# Patient Record
Sex: Female | Born: 1985 | Race: White | Hispanic: Yes | Marital: Married | Smoking: Never smoker
Health system: Southern US, Community
[De-identification: ages and names within clinical notes are randomized; demographics above are authoritative.]

## PROBLEM LIST (undated history)

## (undated) ENCOUNTER — Emergency Department (HOSPITAL_COMMUNITY): Admission: EM | Disposition: A | Discharge status: Home / Self Care | Payer: Self-pay

## (undated) DIAGNOSIS — K429 Umbilical hernia without obstruction or gangrene: Secondary | ICD-10-CM

## (undated) DIAGNOSIS — T7840XA Allergy, unspecified, initial encounter: Secondary | ICD-10-CM

## (undated) DIAGNOSIS — O1420 HELLP syndrome (HELLP), unspecified trimester: Secondary | ICD-10-CM

## (undated) DIAGNOSIS — F329 Major depressive disorder, single episode, unspecified: Secondary | ICD-10-CM

## (undated) DIAGNOSIS — M329 Systemic lupus erythematosus, unspecified: Secondary | ICD-10-CM

## (undated) DIAGNOSIS — B009 Herpesviral infection, unspecified: Secondary | ICD-10-CM

## (undated) DIAGNOSIS — F32A Depression, unspecified: Secondary | ICD-10-CM

## (undated) DIAGNOSIS — R200 Anesthesia of skin: Secondary | ICD-10-CM

## (undated) DIAGNOSIS — K219 Gastro-esophageal reflux disease without esophagitis: Secondary | ICD-10-CM

## (undated) HISTORY — DX: Major depressive disorder, single episode, unspecified: F32.9

## (undated) HISTORY — DX: Umbilical hernia without obstruction or gangrene: K42.9

## (undated) HISTORY — DX: Allergy, unspecified, initial encounter: T78.40XA

## (undated) HISTORY — DX: Systemic lupus erythematosus, unspecified: M32.9

## (undated) HISTORY — DX: Depression, unspecified: F32.A

---
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
## 2006-06-24 ENCOUNTER — Emergency Department (HOSPITAL_COMMUNITY): Admission: EM | Admit: 2006-06-24 | Discharge: 2006-06-25 | Payer: Self-pay | Admitting: Emergency Medicine

## 2006-06-26 ENCOUNTER — Emergency Department (HOSPITAL_COMMUNITY): Admission: EM | Admit: 2006-06-26 | Discharge: 2006-06-26 | Payer: Self-pay | Admitting: Emergency Medicine

## 2006-08-14 ENCOUNTER — Ambulatory Visit: Payer: Self-pay | Admitting: Family Medicine

## 2006-08-21 ENCOUNTER — Ambulatory Visit (HOSPITAL_COMMUNITY): Admission: RE | Admit: 2006-08-21 | Discharge: 2006-08-21 | Payer: Self-pay | Admitting: Obstetrics & Gynecology

## 2006-11-13 ENCOUNTER — Ambulatory Visit: Payer: Self-pay | Admitting: Family Medicine

## 2006-11-18 ENCOUNTER — Ambulatory Visit (HOSPITAL_COMMUNITY): Admission: RE | Admit: 2006-11-18 | Discharge: 2006-11-18 | Payer: Self-pay | Admitting: Obstetrics & Gynecology

## 2007-06-11 ENCOUNTER — Emergency Department (HOSPITAL_COMMUNITY): Admission: EM | Admit: 2007-06-11 | Discharge: 2007-06-12 | Payer: Self-pay | Admitting: Emergency Medicine

## 2007-06-15 ENCOUNTER — Emergency Department (HOSPITAL_COMMUNITY): Admission: EM | Admit: 2007-06-15 | Discharge: 2007-06-15 | Payer: Self-pay | Admitting: Emergency Medicine

## 2007-09-18 ENCOUNTER — Inpatient Hospital Stay (HOSPITAL_COMMUNITY): Admission: AD | Admit: 2007-09-18 | Discharge: 2007-09-18 | Payer: Self-pay | Admitting: Obstetrics & Gynecology

## 2008-01-01 ENCOUNTER — Inpatient Hospital Stay (HOSPITAL_COMMUNITY): Admission: AD | Admit: 2008-01-01 | Discharge: 2008-01-01 | Payer: Self-pay | Admitting: Obstetrics & Gynecology

## 2008-01-29 ENCOUNTER — Inpatient Hospital Stay (HOSPITAL_COMMUNITY): Admission: AD | Admit: 2008-01-29 | Discharge: 2008-01-29 | Payer: Self-pay | Admitting: Obstetrics & Gynecology

## 2008-02-08 ENCOUNTER — Inpatient Hospital Stay (HOSPITAL_COMMUNITY): Admission: AD | Admit: 2008-02-08 | Discharge: 2008-02-08 | Payer: Self-pay | Admitting: Obstetrics & Gynecology

## 2008-02-15 ENCOUNTER — Inpatient Hospital Stay (HOSPITAL_COMMUNITY): Admission: AD | Admit: 2008-02-15 | Discharge: 2008-02-16 | Payer: Self-pay | Admitting: Obstetrics & Gynecology

## 2008-02-29 ENCOUNTER — Inpatient Hospital Stay (HOSPITAL_COMMUNITY): Admission: AD | Admit: 2008-02-29 | Discharge: 2008-03-03 | Payer: Self-pay | Admitting: Obstetrics & Gynecology

## 2008-08-25 ENCOUNTER — Emergency Department (HOSPITAL_COMMUNITY): Admission: EM | Admit: 2008-08-25 | Discharge: 2008-08-25 | Payer: Self-pay | Admitting: Emergency Medicine

## 2009-02-14 ENCOUNTER — Emergency Department (HOSPITAL_COMMUNITY): Admission: EM | Admit: 2009-02-14 | Discharge: 2009-02-15 | Payer: Self-pay | Admitting: Emergency Medicine

## 2009-04-21 ENCOUNTER — Ambulatory Visit (HOSPITAL_COMMUNITY): Admission: RE | Admit: 2009-04-21 | Discharge: 2009-04-21 | Payer: Self-pay | Admitting: Obstetrics & Gynecology

## 2009-04-22 ENCOUNTER — Inpatient Hospital Stay (HOSPITAL_COMMUNITY): Admission: AD | Admit: 2009-04-22 | Discharge: 2009-04-22 | Payer: Self-pay | Admitting: Obstetrics & Gynecology

## 2009-04-25 ENCOUNTER — Inpatient Hospital Stay (HOSPITAL_COMMUNITY): Admission: AD | Admit: 2009-04-25 | Discharge: 2009-04-25 | Payer: Self-pay | Admitting: Obstetrics & Gynecology

## 2009-08-04 ENCOUNTER — Emergency Department (HOSPITAL_COMMUNITY): Admission: EM | Admit: 2009-08-04 | Discharge: 2009-08-04 | Payer: Self-pay | Admitting: Emergency Medicine

## 2009-08-15 ENCOUNTER — Ambulatory Visit (HOSPITAL_COMMUNITY): Admission: RE | Admit: 2009-08-15 | Discharge: 2009-08-15 | Payer: Self-pay | Admitting: Obstetrics

## 2009-09-08 ENCOUNTER — Ambulatory Visit (HOSPITAL_COMMUNITY): Admission: RE | Admit: 2009-09-08 | Discharge: 2009-09-08 | Payer: Self-pay | Admitting: Obstetrics

## 2009-09-21 ENCOUNTER — Inpatient Hospital Stay (HOSPITAL_COMMUNITY): Admission: AD | Admit: 2009-09-21 | Discharge: 2009-09-21 | Payer: Self-pay | Admitting: Obstetrics

## 2009-11-11 ENCOUNTER — Inpatient Hospital Stay (HOSPITAL_COMMUNITY): Admission: AD | Admit: 2009-11-11 | Discharge: 2009-11-11 | Payer: Self-pay | Admitting: Obstetrics

## 2009-11-11 ENCOUNTER — Ambulatory Visit: Payer: Self-pay | Admitting: Obstetrics

## 2009-11-11 ENCOUNTER — Ambulatory Visit: Payer: Self-pay | Admitting: Obstetrics and Gynecology

## 2009-11-29 ENCOUNTER — Inpatient Hospital Stay (HOSPITAL_COMMUNITY): Admission: AD | Admit: 2009-11-29 | Discharge: 2009-11-29 | Payer: Self-pay | Admitting: Obstetrics

## 2009-12-03 HISTORY — PX: UMBILICAL HERNIA REPAIR: SHX196

## 2009-12-10 ENCOUNTER — Inpatient Hospital Stay (HOSPITAL_COMMUNITY): Admission: AD | Admit: 2009-12-10 | Discharge: 2009-12-10 | Payer: Self-pay | Admitting: Obstetrics

## 2009-12-14 ENCOUNTER — Inpatient Hospital Stay (HOSPITAL_COMMUNITY): Admission: AD | Admit: 2009-12-14 | Discharge: 2009-12-17 | Payer: Self-pay | Admitting: Obstetrics

## 2010-03-19 ENCOUNTER — Emergency Department (HOSPITAL_COMMUNITY): Admission: EM | Admit: 2010-03-19 | Discharge: 2010-03-20 | Payer: Self-pay | Admitting: Emergency Medicine

## 2010-10-30 ENCOUNTER — Ambulatory Visit (HOSPITAL_BASED_OUTPATIENT_CLINIC_OR_DEPARTMENT_OTHER): Admission: RE | Admit: 2010-10-30 | Discharge: 2010-10-30 | Payer: Self-pay | Admitting: General Surgery

## 2010-11-09 ENCOUNTER — Emergency Department (HOSPITAL_COMMUNITY): Admission: EM | Admit: 2010-11-09 | Discharge: 2010-09-06 | Payer: Self-pay | Admitting: Emergency Medicine

## 2010-12-25 ENCOUNTER — Encounter: Payer: Self-pay | Admitting: Family Medicine

## 2011-02-13 LAB — CBC
Hemoglobin: 11.1 g/dL — ABNORMAL LOW (ref 12.0–15.0)
MCV: 86.9 fL (ref 78.0–100.0)
RDW: 13.4 % (ref 11.5–15.5)
WBC: 5.1 10*3/uL (ref 4.0–10.5)

## 2011-02-13 LAB — PREGNANCY, URINE: Preg Test, Ur: NEGATIVE

## 2011-02-13 LAB — DIFFERENTIAL
Lymphs Abs: 1.8 10*3/uL (ref 0.7–4.0)
Monocytes Absolute: 0.4 10*3/uL (ref 0.1–1.0)
Monocytes Relative: 8 % (ref 3–12)

## 2011-02-15 LAB — DIFFERENTIAL
Basophils Absolute: 0 10*3/uL (ref 0.0–0.1)
Basophils Relative: 0 % (ref 0–1)
Eosinophils Absolute: 0.2 10*3/uL (ref 0.0–0.7)
Lymphocytes Relative: 41 % (ref 12–46)
Monocytes Absolute: 0.5 10*3/uL (ref 0.1–1.0)
Neutrophils Relative %: 49 % (ref 43–77)

## 2011-02-15 LAB — CBC
MCHC: 34.5 g/dL (ref 30.0–36.0)
Platelets: 251 10*3/uL (ref 150–400)
RBC: 3.79 MIL/uL — ABNORMAL LOW (ref 3.87–5.11)
RDW: 14 % (ref 11.5–15.5)
WBC: 6 10*3/uL (ref 4.0–10.5)

## 2011-02-18 LAB — RPR: RPR Ser Ql: NONREACTIVE

## 2011-02-18 LAB — CBC
HCT: 28.5 % — ABNORMAL LOW (ref 36.0–46.0)
RBC: 2.08 MIL/uL — ABNORMAL LOW (ref 3.87–5.11)
WBC: 8.8 10*3/uL (ref 4.0–10.5)

## 2011-03-06 LAB — URINALYSIS, ROUTINE W REFLEX MICROSCOPIC
Bilirubin Urine: NEGATIVE
Glucose, UA: NEGATIVE mg/dL
Hgb urine dipstick: NEGATIVE
Ketones, ur: NEGATIVE mg/dL
Protein, ur: NEGATIVE mg/dL
Specific Gravity, Urine: 1.015 (ref 1.005–1.030)
Urobilinogen, UA: 0.2 mg/dL (ref 0.0–1.0)
pH: 7.5 (ref 5.0–8.0)

## 2011-03-06 LAB — COMPREHENSIVE METABOLIC PANEL
ALT: 11 U/L (ref 0–35)
Alkaline Phosphatase: 187 U/L — ABNORMAL HIGH (ref 39–117)
Creatinine, Ser: 0.35 mg/dL — ABNORMAL LOW (ref 0.4–1.2)
GFR calc Af Amer: >60 mL/min (ref >60)
GFR calc non Af Amer: >60 mL/min (ref >60)
Potassium: 3.5 mEq/L (ref 3.5–5.1)
Sodium: 133 mEq/L — ABNORMAL LOW (ref 135–145)
Total Bilirubin: 0.3 mg/dL (ref 0.3–1.2)

## 2011-03-06 LAB — GC/CHLAMYDIA PROBE AMP, GENITAL: GC Probe Amp, Genital: NEGATIVE

## 2011-03-06 LAB — CBC
HCT: 28.7 % — ABNORMAL LOW (ref 36.0–46.0)
Hemoglobin: 9.8 g/dL — ABNORMAL LOW (ref 12.0–15.0)
MCHC: 34.1 g/dL (ref 30.0–36.0)
MCV: 90.8 fL (ref 78.0–100.0)
Platelets: 197 10*3/uL (ref 150–400)
RBC: 3.16 MIL/uL — ABNORMAL LOW (ref 3.87–5.11)
RDW: 12.6 % (ref 11.5–15.5)

## 2011-03-06 LAB — WET PREP, GENITAL

## 2011-03-06 LAB — STREP B DNA PROBE: Strep Group B Ag: NEGATIVE

## 2011-03-06 LAB — LACTATE DEHYDROGENASE: LDH: 163 U/L (ref 94–250)

## 2011-03-08 LAB — URINALYSIS, ROUTINE W REFLEX MICROSCOPIC
Hgb urine dipstick: NEGATIVE
Nitrite: NEGATIVE
Protein, ur: NEGATIVE mg/dL
Urobilinogen, UA: 0.2 mg/dL (ref 0.0–1.0)

## 2011-03-08 LAB — CBC
HCT: 28.7 % — ABNORMAL LOW (ref 36.0–46.0)
Platelets: 184 10*3/uL (ref 150–400)
RDW: 13.3 % (ref 11.5–15.5)

## 2011-03-08 LAB — FETAL FIBRONECTIN: Fetal Fibronectin: NEGATIVE

## 2011-03-09 LAB — CBC
Platelets: 204 10*3/uL (ref 150–400)
WBC: 7.1 10*3/uL (ref 4.0–10.5)

## 2011-03-09 LAB — DIFFERENTIAL
Lymphocytes Relative: 21 % (ref 12–46)
Lymphs Abs: 1.5 10*3/uL (ref 0.7–4.0)
Neutrophils Relative %: 72 % (ref 43–77)

## 2011-03-09 LAB — BASIC METABOLIC PANEL
BUN: 5 mg/dL — ABNORMAL LOW (ref 6–23)
Creatinine, Ser: 0.44 mg/dL (ref 0.4–1.2)
GFR calc non Af Amer: >60 mL/min (ref >60)
Potassium: 3.3 mEq/L — ABNORMAL LOW (ref 3.5–5.1)

## 2011-03-13 LAB — HCG, QUANTITATIVE, PREGNANCY
hCG, Beta Chain, Quant, S: 132 m[IU]/mL — ABNORMAL HIGH (ref <5)
hCG, Beta Chain, Quant, S: 47 m[IU]/mL — ABNORMAL HIGH (ref <5)

## 2011-03-15 LAB — HEPATIC FUNCTION PANEL
ALT: 11 U/L (ref 0–35)
Alkaline Phosphatase: 206 U/L — ABNORMAL HIGH (ref 39–117)
Indirect Bilirubin: 0.6 mg/dL (ref 0.3–0.9)
Total Protein: 7.3 g/dL (ref 6.0–8.3)

## 2011-03-15 LAB — URINE MICROSCOPIC-ADD ON

## 2011-03-15 LAB — POCT I-STAT, CHEM 8
HCT: 41 % (ref 36.0–46.0)
Hemoglobin: 13.9 g/dL (ref 12.0–15.0)
Potassium: 3.3 mEq/L — ABNORMAL LOW (ref 3.5–5.1)
Sodium: 140 mEq/L (ref 135–145)

## 2011-03-15 LAB — URINALYSIS, ROUTINE W REFLEX MICROSCOPIC
Bilirubin Urine: NEGATIVE
Nitrite: NEGATIVE
Specific Gravity, Urine: 1.03 (ref 1.005–1.030)
Urobilinogen, UA: 0.2 mg/dL (ref 0.0–1.0)

## 2011-04-11 ENCOUNTER — Emergency Department (HOSPITAL_COMMUNITY)
Admission: EM | Admit: 2011-04-11 | Discharge: 2011-04-11 | Disposition: A | Discharge status: Home / Self Care | Payer: BC Managed Care – PPO | Attending: Emergency Medicine | Admitting: Emergency Medicine

## 2011-04-11 ENCOUNTER — Emergency Department (HOSPITAL_COMMUNITY): Payer: BC Managed Care – PPO

## 2011-04-11 DIAGNOSIS — L02519 Cutaneous abscess of unspecified hand: Secondary | ICD-10-CM | POA: Insufficient documentation

## 2011-04-11 DIAGNOSIS — G43909 Migraine, unspecified, not intractable, without status migrainosus: Secondary | ICD-10-CM | POA: Insufficient documentation

## 2011-04-11 DIAGNOSIS — A6000 Herpesviral infection of urogenital system, unspecified: Secondary | ICD-10-CM | POA: Insufficient documentation

## 2011-04-11 DIAGNOSIS — F411 Generalized anxiety disorder: Secondary | ICD-10-CM | POA: Insufficient documentation

## 2011-04-17 NOTE — H&P (Signed)
NAMELAURELL, Evelyn [name]                ACCOUNT NO.:  [id number]   MEDICAL RECORD NO.:  [id number]          PATIENT TYPE:  INP   LOCATION:  9372                          [facility]ILITY:  [facility]   PHYSICIAN:  Roseanna Rainbow, M.D.DATE OF BIRTH:  1986/03/12   DATE OF ADMISSION:  02/29/2008  DATE OF DISCHARGE:                              HISTORY & PHYSICAL   CHIEF COMPLAINT:  The patient is a 25 year old, para 1 with an  intrauterine pregnancy at 38+ weeks complaining of contractions.   HISTORY OF PRESENT ILLNESS:  Please see the above.   ALLERGIES:  No known drug allergies.   MEDICATIONS:  Please see the medication reconciliation form.   LABORATORY DATA:  Urine culture and sensitivity insignificant growth.  Chlamydia probe negative.  GC probe negative.  1-hour GTT 59.  Hepatitis  B surface antigen negative, hematocrit 29.7, hemoglobin 10.3, HIV  nonreactive, platelets 218,000.  Blood type is O+, antibody screen  negative.  RPR is nonreactive.  Rubella immune.  Sickle cell negative.  GBS negative.   PAST OBSTETRICAL HISTORY:  In January 2004, she was delivered of a live  born female 6 pounds 1 ounce at full term vaginal delivery.   PAST GYN HISTORY:  Questionable genital herpes.   PAST MEDICAL HISTORY:  No significant history of medical diseases.   PAST SURGICAL HISTORY:  No previous surgeries.   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  Does not give any significant history of alcohol usage, has no  significant smoking history.  Denies illicit drug use.   FAMILY HISTORY:  No major illnesses known.   REVIEW OF SYSTEMS:  NEUROLOGIC:  She complains of blurred vision.   PHYSICAL EXAM:  Blood pressures is 160s/90s.  Fetal heart tracing  reassuring.  Tocodynamometer contractions every 2-5 minutes.  Sterile  vaginal exam per the RN. The cervix is loosening, and 80%.   ASSESSMENT:  Pregnancy at term. Rule out severe pregnancy-induced  hypertension.   PLAN:   Admission, magnesium sulfate, seizure prophylaxis. Will start  p.o. labetalol to better control her blood pressures. Possible  augmentation of labor.  Will also check a PIH panel.      Roseanna Rainbow, M.D.  Electronically Signed     LAJ/MEDQ  D:  03/01/2008  T:  03/01/2008  Job:  [id number]

## 2011-04-20 NOTE — Group Therapy Note (Signed)
NAMEPOONAM, WOEHRLE NO.:  [id number]   MEDICAL RECORD NO.:  [id number]          PATIENT TYPE:  WOC   LOCATION:  [facility]                   FACILITY:  [facility]   PHYSICIAN:  Montey Hora, M.D.    DATE OF BIRTH:  [birth year]   DATE OF SERVICE:  08/14/2006                                    CLINIC NOTE   Follow-up for ovarian cysts.   This is a 25 year old gravida 1, para 1, status post IUD [place] placement  approximately 2 years ago.  She began to have the onset of severe abdominal  pain in July, unresponsive to medication, having previously started in a  more moderate fashion with intercourse.  She was seen at the emergency room,  received a pelvic ultrasound which showed two cysts on her left ovary, one  of which was hemorrhagic.  She was given pain medicines and told to follow  up in 2 months at the [facility].  Since hospital discharge, the patient  reveals that she continues to have moderate to severe abdominal pain all the  time, 6/10, and dyspareunia to the point where she does not have  intercourse.  She had an LMP of June 01, 2006, which was light and has had  very irregular, very light periods in the past year or so.  Now in addition  to having right-sided pain she is also having some left-sided pain and no  associated fever.  She did have some hematochezia a couple of months ago  thought to be associated with constipation and she was told to drink more  water and has not had any further problems with that.  She denied any  dysuria.  She did not get any antibiotics at the hospital.   PAST MEDICAL HISTORY:  Unremarkable.   MEDICATIONS:  None.   ALLERGIES:  No known drug allergies.   PAST OBSTETRICAL HISTORY:  NSVD x1 without complications.   FAMILY HISTORY:  No endometriosis or infertility problems.   PHYSICAL EXAMINATION:  VITAL SIGNS:  Temperature 98.6, pulse 71, blood  pressure 102/62, weight 108.2.  GENERAL:  WDWN, NAD.  The patient denies  fever, had a mild weight gain of 2  pounds.  No chest pain, shortness of breath, or other associated symptoms.  HEART:  Regular rate and rhythm without murmur.  LUNGS:  Clear to auscultation bilaterally.  Normal respiratory effort.  ABDOMEN:  Soft and nontender.  No guarding or rebound.  No CVA tenderness.  PELVIC:  Normal external genitalia.  Copious watery vaginal discharge.  No  cervical motion tenderness.  No left-sided abdominal tenderness.  She has  mild right-sided abdominal tenderness with no obvious ovarian enlargement.   ASSESSMENT:  1. Right-sided abdominal pain with previously noted ovarian cysts by      ultrasound.  I gave the patient Ortho-Tri-Cyclen take as directed x3      months to suppress the ovarian cysts.  Recheck her pelvic ultrasound to      make sure her hemorrhagic/complicated cysts have resolved.  2. Copious vaginal discharge sent for a wet prep today, but went ahead and      treated her with Flagyl  500 mg p.o. b.i.d. x7 days.  Plan to follow up      in 3 months.           ______________________________  Montey Hora, M.D.     KR/MEDQ  D:  08/14/2006  T:  08/15/2006  Job:  [id number]

## 2011-04-20 NOTE — Group Therapy Note (Signed)
NAMETRIVIA, HEFFELFINGER NO.:  [id number]   MEDICAL RECORD NO.:  [id number]          PATIENT TYPE:  WOC   LOCATION:  [facility]                   FACILITY:  [facility]   PHYSICIAN:  Montey Hora, M.D.    DATE OF BIRTH:  March 14, 1986   DATE OF SERVICE:  11/13/2006                                  CLINIC NOTE   This is a 25 year old G1, P1 who had a Jearld Adjutant IUD placed two years ago.  She did well on it initially. Last summer had the onset of severe right  lower quadrant pain, went to the emergency room and got a pelvic  ultrasound, and was diagnosed with a right ovarian hemorrhagic cyst. She  followed up in __________ [facility] in September 2007, and the pelvic  ultrasound was repeated showing resolution of that cyst, and that the  IUD was in proper position. She was cycled on two months of Ortho-Tri-  Cyclen, initially her pain did a little bit better, and then she has  been off the pill now for a month and thinks that the pain or pressure  in her bilateral low abdomen is a little worse. It is especially bad on  the right when she has intercourse, and there is a certain place that is  tender and __________ stimulated during intercourse, causes her to have  quite a bit of bleeding after intercourse. She denies any actual regular  menstrual cycles. She is also having a thick white discharge that is  itching with no odor, and itching is especially bad after intercourse.  The couple does not use condoms.   MEDICINES:  None.   ALLERGIES:  None.   Last Pap May 2007.   PHYSICAL EXAMINATION:  Pulse is 78, blood pressure 102/70, weight is  110, height is 5 feet 1 inch. General: WEE, WNNAB. Pelvic exam shows  erythematous labia and pelvic area. Vaginal mucosa is slightly  irritated, there is thick white discharge. The cervix is noted to be  multiparous with some ectropion. She has no tenderness to palpation of  the cervix, However, in the right lower quadrant her ovary does  seem to  be mildly enlarged and tender. The left lower quadrant, the ovary does  not appear to be enlarged and is not tender.   ASSESSMENT AND PLAN:  1. Bilateral lower abdominal pain, worse on the right, with previously      noted ovarian cyst by ultrasound, resolved a couple of months      thereafter. Will repeat the pelvic ultrasound just to rule out any      __________ pathology and continue the Cyclen process with Nova ring      or Ortho-Tri-Cyclen birth control pills. The patient will also take      Aleve 1 tablet twice a day with food for 10-14 days to decrease the      inflammation in the area.  2. Probable yeast vaginitis, Diflucan 150 times one, repeat in four      days #2 dispenses and one refill. Followup in six months.           ______________________________  Montey Hora, M.D.  KR/MEDQ  D:  11/13/2006  T:  11/13/2006  Job:  [id number]

## 2011-04-20 NOTE — Discharge Summary (Signed)
NAME:  Evelyn Evelyn [name], Evelyn Evelyn [name]                          ACCOUNT NO.:  [id number]   MEDICAL RECORD NO.:  [id number]                   PATIENT TYPE:  INP   LOCATION:  9145                                 [facility]   PHYSICIAN:  Phil D. Okey Dupre, M.D.                  DATE OF BIRTH:  February 11, 1986   DATE OF ADMISSION:  12/12/2002  DATE OF DISCHARGE:  12/17/2002                                 DISCHARGE SUMMARY   DISCHARGE DIAGNOSES:  1. Evelyn [name] 25 year old G1, P1 status post spontaneous vaginal delivery and     treatment for preeclampsia.  2. Elevated liver enzymes.   DISCHARGE MEDICATIONS:  1. Ibuprofen 600 mg q.6h. p.r.n. pain.  2. Ferrous sulfate 325 mg b.i.d. with meals.  3. Colace 100 mg daily for constipation.  4. Prenatal vitamins daily while breast-feeding.   DISPOSITION AND FOLLOWUP:  The patient is stable on day of discharge and  instructed to follow up at [facility] in six weeks for Evelyn [name] postpartum  check as well as return to GYN Clinic here at [facility] in one week  to recheck liver enzymes.  The patient to call for an appointment.  The  patient is also to receive an IUD placed at [facility] at her six week  visit.   ADMISSION HISTORY AND PHYSICAL:  This 25 year old G1, P0 presented to the  MAU at 37 weeks and 5 days gestational age for Evelyn [name] recheck of her blood  pressure and laboratory results.  She had some laboratory work done on  January 9 which revealed Evelyn [name] high uric acid and she presented to the MAU with  complaints of __________ and seeing yellow spots intermittently, also  nauseated today without vomiting, and mild headache.  She is admitted for  preeclampsia and cervical ripening.  On admission blood pressures were  129/76 and the recheck was 124/83.  Her cervix was 1-2 cm long and -2 and  fetal heart rate tracing was reactive.   ADMISSION LABORATORY DATA:  White blood count 7.2, hemoglobin 10.2,  hematocrit 29.7.  Sodium 140, potassium 4.5, chloride 108, bicarbonate  24,  glucose 84, BUN 8, creatinine 0.8, calcium 8.7, total protein 6.9, albumin  3.3, AST 26, ALT less than 19, alkaline phosphatase 335, total bilirubin  0.3, LDH 201, uric acid 8.7.  Urinalysis within normal limits.  No protein.  RPR nonreactive.   HOSPITAL COURSE:  The patient was admitted to labor and delivery and  Cervidil was placed for cervical ripening.  The patient was started on  magnesium sulfate during labor for seizure prophylaxis and through labor her  blood pressures were ranging systolic 117-134, diastolic 75-101.  She  delivered on January 11 Evelyn [name] viable female by spontaneous vaginal delivery under  no anesthesia.  The patient and infant were stable.  The patient was then  transferred to the adult ICU and continued on the magnesium sulfate.  Day  after delivery she diuresed in the afternoon and then urine output decreased  over that night to the next morning.  Her magnesium was bolused and  increased to 2 g the following day and she diuresed well.  Hospital day  number three the magnesium sulfate was discontinued and she was transferred  to regular bed.  Her blood pressures remained stable 100-110/62-80.  Throughout her postpartum course she denied headache or blurry vision or  shortness of breath.  Her hemoglobin went down to 8.0 and she was started on  ferrous sulfate b.i.d.  Her pain was well controlled with ibuprofen and she  was breast-feeding without difficulty and was ready to go home on hospital  day number five.    DISCHARGE LABORATORY DATA:  White blood count 12.7, hemoglobin 10.2,  hematocrit 29.8, platelets 276,000.  Sodium 138, potassium 4.6, chloride  106, bicarbonate 23, glucose 75, BUN 12, creatinine 0.6, calcium 9.1, total  protein 7.1, albumin 3.1, AST 89, ALT 48, alkaline phosphatase 239, total  bilirubin 0.3, LDH 267, uric acid 7.6.     [name], M.D.                       Phil D. Okey Dupre, M.D.    AS/MEDQ  D:  12/17/2002  T:  12/17/2002  Job:   [id number]

## 2011-07-26 ENCOUNTER — Emergency Department (HOSPITAL_COMMUNITY)
Admission: EM | Admit: 2011-07-26 | Discharge: 2011-07-26 | Disposition: A | Discharge status: Home / Self Care | Payer: BC Managed Care – PPO | Attending: Emergency Medicine | Admitting: Emergency Medicine

## 2011-07-26 ENCOUNTER — Emergency Department (HOSPITAL_COMMUNITY): Payer: BC Managed Care – PPO

## 2011-07-26 DIAGNOSIS — R51 Headache: Secondary | ICD-10-CM | POA: Insufficient documentation

## 2011-07-26 DIAGNOSIS — S0003XA Contusion of scalp, initial encounter: Secondary | ICD-10-CM | POA: Insufficient documentation

## 2011-07-26 DIAGNOSIS — F411 Generalized anxiety disorder: Secondary | ICD-10-CM | POA: Insufficient documentation

## 2011-07-26 DIAGNOSIS — R220 Localized swelling, mass and lump, head: Secondary | ICD-10-CM | POA: Insufficient documentation

## 2011-07-26 DIAGNOSIS — S1093XA Contusion of unspecified part of neck, initial encounter: Secondary | ICD-10-CM | POA: Insufficient documentation

## 2011-07-26 DIAGNOSIS — R221 Localized swelling, mass and lump, neck: Secondary | ICD-10-CM | POA: Insufficient documentation

## 2011-07-26 DIAGNOSIS — IMO0002 Reserved for concepts with insufficient information to code with codable children: Secondary | ICD-10-CM | POA: Insufficient documentation

## 2011-08-23 LAB — URINALYSIS, ROUTINE W REFLEX MICROSCOPIC
Bilirubin Urine: NEGATIVE
Glucose, UA: NEGATIVE
Hgb urine dipstick: NEGATIVE
Specific Gravity, Urine: 1.015
Urobilinogen, UA: 0.2

## 2011-08-24 LAB — URINALYSIS, ROUTINE W REFLEX MICROSCOPIC
Glucose, UA: NEGATIVE
Hgb urine dipstick: NEGATIVE
Specific Gravity, Urine: 1.01
pH: 7

## 2011-08-27 LAB — CBC
HCT: 29.4 — ABNORMAL LOW
HCT: 29.3 — ABNORMAL LOW
Hemoglobin: 10.3 — ABNORMAL LOW
Hemoglobin: 5.1 — CL
MCHC: 35.4
MCV: 90.2
MCV: 90.3
Platelets: 139 — ABNORMAL LOW
RBC: 3.26 — ABNORMAL LOW
RBC: 1.58 — ABNORMAL LOW
RDW: 12.6
WBC: 8.2
WBC: 11.8 — ABNORMAL HIGH
WBC: 7.7

## 2011-08-27 LAB — COMPREHENSIVE METABOLIC PANEL
ALT: 15
AST: 28
Albumin: 2.8 — ABNORMAL LOW
Alkaline Phosphatase: 246 — ABNORMAL HIGH
Alkaline Phosphatase: 456 — ABNORMAL HIGH
BUN: 6
CO2: 24
Calcium: 6.3 — CL
Chloride: 101
Chloride: 108
Creatinine, Ser: 0.56
GFR calc Af Amer: >60
GFR calc non Af Amer: >60
Glucose, Bld: 94
Potassium: 3.7
Potassium: 3.7
Sodium: 130 — ABNORMAL LOW
Total Bilirubin: 0.5

## 2011-08-27 LAB — URINALYSIS, ROUTINE W REFLEX MICROSCOPIC
Glucose, UA: NEGATIVE
Nitrite: NEGATIVE
Nitrite: NEGATIVE
Specific Gravity, Urine: <1.005 — ABNORMAL LOW
Specific Gravity, Urine: <1.005 — ABNORMAL LOW
Urobilinogen, UA: 0.2
pH: 6.5
pH: 6

## 2011-08-27 LAB — BASIC METABOLIC PANEL
Chloride: 104
GFR calc Af Amer: >60
GFR calc non Af Amer: >60
Potassium: 4.3

## 2011-08-27 LAB — LACTATE DEHYDROGENASE: LDH: 208

## 2011-08-27 LAB — RPR: RPR Ser Ql: NONREACTIVE

## 2011-08-27 LAB — URIC ACID: Uric Acid, Serum: 7.3 — ABNORMAL HIGH

## 2011-09-12 LAB — URINALYSIS, ROUTINE W REFLEX MICROSCOPIC
Glucose, UA: NEGATIVE
Hgb urine dipstick: NEGATIVE
Ketones, ur: NEGATIVE
pH: 6

## 2011-09-18 LAB — URINALYSIS, ROUTINE W REFLEX MICROSCOPIC
Nitrite: NEGATIVE
Specific Gravity, Urine: >1.03 — ABNORMAL HIGH
Urobilinogen, UA: 0.2

## 2011-09-18 LAB — BASIC METABOLIC PANEL
BUN: 10
CO2: 25
Chloride: 106
Creatinine, Ser: 0.61
Glucose, Bld: 102 — ABNORMAL HIGH

## 2011-09-18 LAB — DIFFERENTIAL
Basophils Relative: 0
Eosinophils Absolute: 0.3
Monocytes Relative: 7
Neutrophils Relative %: 61

## 2011-09-18 LAB — CBC
MCHC: 34.5
MCV: 86.4
Platelets: 349

## 2011-09-18 LAB — URINE MICROSCOPIC-ADD ON

## 2011-09-18 LAB — WET PREP, GENITAL: Trich, Wet Prep: NONE SEEN

## 2011-09-18 LAB — PREGNANCY, URINE: Preg Test, Ur: NEGATIVE

## 2011-09-18 LAB — LIPASE, BLOOD: Lipase: 12

## 2011-09-18 LAB — GC/CHLAMYDIA PROBE AMP, GENITAL: GC Probe Amp, Genital: NEGATIVE

## 2011-09-27 ENCOUNTER — Emergency Department (HOSPITAL_COMMUNITY): Payer: BC Managed Care – PPO

## 2011-09-27 ENCOUNTER — Emergency Department (HOSPITAL_COMMUNITY)
Admission: EM | Admit: 2011-09-27 | Discharge: 2011-09-27 | Disposition: A | Discharge status: Home / Self Care | Payer: BC Managed Care – PPO | Attending: Emergency Medicine | Admitting: Emergency Medicine

## 2011-09-27 DIAGNOSIS — F411 Generalized anxiety disorder: Secondary | ICD-10-CM | POA: Insufficient documentation

## 2011-09-27 DIAGNOSIS — W010XXA Fall on same level from slipping, tripping and stumbling without subsequent striking against object, initial encounter: Secondary | ICD-10-CM | POA: Insufficient documentation

## 2011-09-27 DIAGNOSIS — S93609A Unspecified sprain of unspecified foot, initial encounter: Secondary | ICD-10-CM | POA: Insufficient documentation

## 2011-09-27 DIAGNOSIS — M79609 Pain in unspecified limb: Secondary | ICD-10-CM | POA: Insufficient documentation

## 2011-09-27 DIAGNOSIS — S90129A Contusion of unspecified lesser toe(s) without damage to nail, initial encounter: Secondary | ICD-10-CM | POA: Insufficient documentation

## 2011-10-09 ENCOUNTER — Other Ambulatory Visit: Payer: Self-pay | Admitting: Internal Medicine

## 2011-10-09 ENCOUNTER — Ambulatory Visit (HOSPITAL_COMMUNITY)
Admission: RE | Admit: 2011-10-09 | Discharge: 2011-10-09 | Disposition: A | Discharge status: Home / Self Care | Payer: BC Managed Care – PPO | Source: Ambulatory Visit | Attending: Internal Medicine | Admitting: Internal Medicine

## 2011-10-09 DIAGNOSIS — K37 Unspecified appendicitis: Secondary | ICD-10-CM

## 2011-10-09 DIAGNOSIS — R1031 Right lower quadrant pain: Secondary | ICD-10-CM | POA: Insufficient documentation

## 2011-10-09 DIAGNOSIS — N9489 Other specified conditions associated with female genital organs and menstrual cycle: Secondary | ICD-10-CM | POA: Insufficient documentation

## 2011-10-09 MED ORDER — IOHEXOL 300 MG/ML  SOLN
100.0000 mL | Freq: Once | INTRAMUSCULAR | Status: AC | PRN
  Administered 2011-10-09: 80 mL via INTRAVENOUS

## 2012-01-09 ENCOUNTER — Telehealth: Payer: Self-pay

## 2012-01-09 ENCOUNTER — Ambulatory Visit (INDEPENDENT_AMBULATORY_CARE_PROVIDER_SITE_OTHER): Payer: BC Managed Care – PPO | Admitting: Physician Assistant

## 2012-01-09 ENCOUNTER — Ambulatory Visit: Payer: BC Managed Care – PPO

## 2012-01-09 VITALS — BP 95/61 | HR 90 | Temp 97.9°F | Resp 16 | Ht 61.0 in | Wt 112.0 lb

## 2012-01-09 DIAGNOSIS — J029 Acute pharyngitis, unspecified: Secondary | ICD-10-CM

## 2012-01-09 DIAGNOSIS — J3489 Other specified disorders of nose and nasal sinuses: Secondary | ICD-10-CM

## 2012-01-09 DIAGNOSIS — D7289 Other specified disorders of white blood cells: Secondary | ICD-10-CM

## 2012-01-09 DIAGNOSIS — J020 Streptococcal pharyngitis: Secondary | ICD-10-CM

## 2012-01-09 LAB — POCT CBC
Hemoglobin: 12.3 g/dL (ref 12.2–16.2)
Lymph, poc: 1.9 (ref 0.6–3.4)
MCH, POC: 28.5 pg (ref 27–31.2)
MCHC: 32.3 g/dL (ref 31.8–35.4)
MPV: 10.9 fL (ref 0–99.8)
POC MID %: 4.2 %M (ref 0–12)
WBC: 11.6 10*3/uL — AB (ref 4.6–10.2)

## 2012-01-09 MED ORDER — FLUTICASONE PROPIONATE 50 MCG/ACT NA SUSP: 2.0000 | Freq: Every day | NASAL | Status: DC

## 2012-01-09 MED ORDER — PREDNISONE 20 MG PO TABS: ORAL_TABLET | ORAL | Status: AC

## 2012-01-09 MED ORDER — AMOXICILLIN 875 MG PO TABS: 875.0000 mg | ORAL_TABLET | Freq: Two times a day (BID) | ORAL | Status: AC

## 2012-01-09 NOTE — Telephone Encounter (Signed)
LMOM for pt with xray results and instructions from [place]. Asked for CB with any questions or if pt does not start to feel better in a few days.
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
## 2012-01-09 NOTE — Telephone Encounter (Signed)
.  [facility] Pt is calling for her x-ray was told this morning when seen the results would be ready shortly Please call

## 2012-01-09 NOTE — Patient Instructions (Signed)
Laryngitis At the top of your windpipe is your voice box. It is the source of your voice. Inside your voice box are 2 bands of muscles called vocal cords. When you breathe, your vocal cords are relaxed and open so that air can get into the lungs. When you decide to say something, these cords come together and vibrate. The sound from these vibrations goes into your throat and comes out through your mouth as sound. Laryngitis is an inflammation of the vocal cords that causes hoarseness, cough, loss of voice, sore throat, and dry throat. Laryngitis can often be related to a viral infection, excessive smoking, excessive talking or yelling, inhalation of toxic fumes, allergies, and reflux of acid from your stomach. CAUSES Laryngitis can be temporary (acute) or long-term (chronic). Most cases of acute laryngitis improve with time. The typical cause of acute laryngitis is viral infection, vocal strain, measles or mumps, or bacterial infections. Chronic laryngitis lasts for more than 3 weeks. It is usually caused by vocal cord strain, vocal cord injury, postnasal drip, growths on the vocal cords, or acid reflux. RISK FACTORS  Respiratory infections.   Exposure to irritating substances, such as cigarette smoke, excessive amounts of alcohol, stomach acids, and workplace chemicals.   Voice trauma, such as vocal cord injury from shouting or speaking too loud.  DIAGNOSIS  The most common sign of laryngitis is hoarseness. This can range from partial to total loss of your voice. Laryngoscopy may be necessary. This allows your caregiver to look into the larynx in order to diagnose your condition. HOME CARE INSTRUCTIONS  Drink enough fluids to keep your urine clear or pale yellow.   Rest until you no longer have symptoms or as directed by your caregiver.   Breathe in moist air.   Take all medicine as directed by your caregiver.   Do not smoke.   Talk as little as possible (this includes whispering).    Write on paper instead of talking until your voice is back to normal.   Follow up with your caregiver if your condition has not improved after 10 days.  SEEK MEDICAL CARE IF:   You have trouble breathing.   You cough up blood.   You have persistent fever.   You have increasing pain.   You have difficulty swallowing.  Document Released: 11/19/2005 Document Revised: 08/01/2011 Document Reviewed: 01/25/2011 [employer] Patient Information 2012 [employer]. 

## 2012-01-09 NOTE — Progress Notes (Unsigned)
This encounter was created in error - please disregard.  This encounter was created in error - please disregard.

## 2012-01-09 NOTE — Telephone Encounter (Signed)
X ray was negative for fracture. See note routed to CMA/Rad pool for details. Please call patient.  Evelyn [name]

## 2012-01-10 NOTE — Progress Notes (Signed)
Left message for Evelyn [name] to give [place] a call back

## 2012-01-14 ENCOUNTER — Telehealth: Payer: Self-pay

## 2012-01-15 NOTE — Telephone Encounter (Signed)
SPOKE WITH PT SHE WAS HERE LAST Wednesday AND SHE COULDN'T REALLY TALK. SHE HAS A COUGH THAT IS NOT LETTING HER SLEEP. SHE IS STILL NOT BETTER AND NOW HER THROAT HURTS AGAIN. DOES SHE HAVE TO RTC OR CAN WE RX SOMETHING ELSE? PT REALLY DOESN'T WANT TO COME IN AGAIN

## 2012-01-16 MED ORDER — HYDROCOD POLST-CHLORPHEN POLST 10-8 MG/5ML PO LQCR: 5.0000 mL | Freq: Two times a day (BID) | ORAL | Status: DC

## 2012-01-16 NOTE — Telephone Encounter (Signed)
Can call in tussionex for pts cough - called in to [facility] [place].

## 2012-03-15 ENCOUNTER — Emergency Department (HOSPITAL_COMMUNITY): Payer: BC Managed Care – PPO

## 2012-03-15 ENCOUNTER — Encounter (HOSPITAL_COMMUNITY): Payer: Self-pay

## 2012-03-15 ENCOUNTER — Emergency Department (HOSPITAL_COMMUNITY)
Admission: EM | Admit: 2012-03-15 | Discharge: 2012-03-15 | Disposition: A | Discharge status: Home / Self Care | Payer: BC Managed Care – PPO | Attending: Emergency Medicine | Admitting: Emergency Medicine

## 2012-03-15 DIAGNOSIS — R109 Unspecified abdominal pain: Secondary | ICD-10-CM | POA: Insufficient documentation

## 2012-03-15 DIAGNOSIS — N949 Unspecified condition associated with female genital organs and menstrual cycle: Secondary | ICD-10-CM | POA: Insufficient documentation

## 2012-03-15 DIAGNOSIS — N898 Other specified noninflammatory disorders of vagina: Secondary | ICD-10-CM | POA: Insufficient documentation

## 2012-03-15 LAB — WET PREP, GENITAL
Clue Cells Wet Prep HPF POC: NONE SEEN
Yeast Wet Prep HPF POC: NONE SEEN

## 2012-03-15 LAB — URINALYSIS, ROUTINE W REFLEX MICROSCOPIC
Ketones, ur: NEGATIVE mg/dL
Leukocytes, UA: NEGATIVE
Nitrite: NEGATIVE
Protein, ur: NEGATIVE mg/dL
Urobilinogen, UA: 0.2 mg/dL (ref 0.0–1.0)

## 2012-03-15 LAB — PREGNANCY, URINE: Preg Test, Ur: NEGATIVE

## 2012-03-15 MED ORDER — IBUPROFEN 200 MG PO TABS
600.0000 mg | ORAL_TABLET | Freq: Once | ORAL | Status: AC
  Administered 2012-03-15: 600 mg via ORAL
  Filled 2012-03-15: qty 3

## 2012-03-15 MED ORDER — HYDROCODONE-ACETAMINOPHEN 5-325 MG PO TABS
2.0000 | ORAL_TABLET | Freq: Once | ORAL | Status: AC
  Administered 2012-03-15: 2 via ORAL
  Filled 2012-03-15: qty 2

## 2012-03-15 MED ORDER — HYDROCODONE-ACETAMINOPHEN 5-500 MG PO TABS: 1.0000 | ORAL_TABLET | Freq: Four times a day (QID) | ORAL | Status: AC | PRN

## 2012-03-15 MED ORDER — ONDANSETRON 8 MG PO TBDP
8.0000 mg | ORAL_TABLET | Freq: Once | ORAL | Status: AC
  Administered 2012-03-15: 8 mg via ORAL
  Filled 2012-03-15: qty 1

## 2012-03-15 MED ORDER — HYDROMORPHONE HCL PF 1 MG/ML IJ SOLN
1.0000 mg | Freq: Once | INTRAMUSCULAR | Status: AC
  Administered 2012-03-15: 1 mg via INTRAMUSCULAR
  Filled 2012-03-15: qty 1

## 2012-03-15 NOTE — ED Notes (Signed)
Pt in from home with acute onset of right side abd pain denies n/v/d states pain started as cramping now its a shooting pain

## 2012-03-15 NOTE — ED Notes (Signed)
MD at bedside. 

## 2012-03-15 NOTE — Discharge Instructions (Signed)
Take motrin or aleve as need for pain. You may also take vicodin as need for pain. No driving for the next 6 hours or when taking vicodin. Also, do not take tylenol or acetaminophen containing medication when taking vicodin. Return for recheck tomorrow morning if symptoms fail to improve/resolve (return sooner if worse, worsening or severe pain, fevers, persistent vomiting, other concern).         Abdominal Pain Abdominal pain can be caused by many things. Your caregiver decides the seriousness of your pain by an examination and possibly blood tests and X-rays. Many cases can be observed and treated at home. Most abdominal pain is not caused by a disease and will probably improve without treatment. However, in many cases, more time must pass before a clear cause of the pain can be found. Before that point, it may not be known if you need more testing, or if hospitalization or surgery is needed. HOME CARE INSTRUCTIONS   Do not take laxatives unless directed by your caregiver.   Take pain medicine only as directed by your caregiver.   Only take over-the-counter or prescription medicines for pain, discomfort, or fever as directed by your caregiver.   Try a clear liquid diet (broth, tea, or water) for as long as directed by your caregiver. Slowly move to a bland diet as tolerated.  SEEK IMMEDIATE MEDICAL CARE IF:   The pain does not go away.   You have a fever.   You keep throwing up (vomiting).   The pain is felt only in portions of the abdomen. Pain in the right side could possibly be appendicitis. In an adult, pain in the left lower portion of the abdomen could be colitis or diverticulitis.   You pass bloody or black tarry stools.  MAKE SURE YOU:   Understand these instructions.   Will watch your condition.   Will get help right away if you are not doing well or get worse.  Document Released: 08/29/2005 Document Revised: 11/08/2011 Document Reviewed: 07/07/2008 [facility] Patient  Information 2012 [place], [place].

## 2012-03-15 NOTE — ED Provider Notes (Signed)
History     CSN: [id number]  Arrival date & time 03/15/12  1508   First MD Initiated Contact with Patient 03/15/12 1530      Chief Complaint  Patient presents with  . Abdominal Pain    right side    (Consider location/radiation/quality/duration/timing/severity/associated sxs/prior treatment) The history is provided by the patient.  pt c/o acute onset right lower abd/pelvis pain today. States pain severe at onset. Constant. Similar to pain w prior ruptured ovarian cyst. Normal appetite. No nvd. Having normal bms. lnmp 3/18. No unusual vaginal discharge or bleeding. No gu c/o. No fever or chills. No back/flank pain. No hematuria or hx kidney stones. No dysuria. Only prior abd surgery has been umbilical hernia repair. Pain constant, dull, non radiating, no specific exacerbating or alleviating factors.   History reviewed. No pertinent past medical history.  Past Surgical History  Procedure Date  . Wisdom tooth extraction     No family history on file.  History  Substance Use Topics  . Smoking status: Never Smoker   . Smokeless tobacco: Not on file  . Alcohol Use: Not on file    OB History    Grav Para Term Preterm Abortions TAB SAB Ect Mult Living                  Review of Systems  Constitutional: Negative for fever and chills.  HENT: Negative for neck pain.   Eyes: Negative for pain.  Respiratory: Negative for cough and shortness of breath.   Cardiovascular: Negative for chest pain.  Gastrointestinal: Negative for vomiting, diarrhea and constipation.  Genitourinary: Negative for dysuria and hematuria.  Musculoskeletal: Negative for back pain.  Skin: Negative for rash.  Neurological: Negative for headaches.  Hematological: Does not bruise/bleed easily.  Psychiatric/Behavioral: Negative for confusion.    Allergies  Review of patient's allergies indicates no known allergies.  Home Medications   Current Outpatient Rx  Name Route Sig Dispense Refill  .  FLUTICASONE PROPIONATE 50 MCG/ACT NA SUSP Nasal Place 2 sprays into the nose daily. 16 g 2  . HYDROCOD POLST-CPM POLST ER 10-8 MG/5ML PO LQCR Oral Take 5 mLs by mouth every 12 (twelve) hours. 70 mL 0    BP 116/59  Pulse 89  Temp(Src) 98.3 F (36.8 C) (Oral)  Resp 20  SpO2 100%  LMP 02/18/2012  Physical Exam  Nursing note and vitals reviewed. Constitutional: She is oriented to person, place, and time. She appears well-developed and well-nourished. No distress.  Eyes: Conjunctivae are normal. No scleral icterus.  Neck: Neck supple. No tracheal deviation present.  Cardiovascular: Normal rate.   Pulmonary/Chest: Effort normal. No respiratory distress.  Abdominal: Soft. Normal appearance and bowel sounds are normal. She exhibits no distension and no mass. There is no tenderness. There is no rebound and no guarding.  Genitourinary:       No cva tenderness. Mild clear to sl whitish vaginal discharge. No cmt. No bleeding. Mild right adx tenderness. No masses  Musculoskeletal: She exhibits no edema and no tenderness.  Neurological: She is alert and oriented to person, place, and time.  Skin: Skin is warm and dry. No rash noted.  Psychiatric: She has a normal mood and affect.    ED Course  Procedures (including critical care time)  Labs Reviewed  URINALYSIS, ROUTINE W REFLEX MICROSCOPIC - Abnormal; Notable for the following:    Hgb urine dipstick TRACE (*)    All other components within normal limits  PREGNANCY, URINE  URINE MICROSCOPIC-ADD  ON    Results for orders placed during the hospital encounter of 03/15/12  URINALYSIS, ROUTINE W REFLEX MICROSCOPIC      Component Value Range   Color, Urine YELLOW  YELLOW    APPearance CLEAR  CLEAR    Specific Gravity, Urine 1.011  1.005 - 1.030    pH 6.5  5.0 - 8.0    Glucose, UA NEGATIVE  NEGATIVE (mg/dL)   Hgb urine dipstick TRACE (*) NEGATIVE    Bilirubin Urine NEGATIVE  NEGATIVE    Ketones, ur NEGATIVE  NEGATIVE (mg/dL)   Protein, ur  NEGATIVE  NEGATIVE (mg/dL)   Urobilinogen, UA 0.2  0.0 - 1.0 (mg/dL)   Nitrite NEGATIVE  NEGATIVE    Leukocytes, UA NEGATIVE  NEGATIVE   PREGNANCY, URINE      Component Value Range   Preg Test, Ur NEGATIVE  NEGATIVE   URINE MICROSCOPIC-ADD ON      Component Value Range   Squamous Epithelial / LPF RARE  RARE    WBC, UA 0-2  <3 (WBC/hpf)  WET PREP, GENITAL      Component Value Range   Yeast Wet Prep HPF POC NONE SEEN  NONE SEEN    Trich, Wet Prep NONE SEEN  NONE SEEN    Clue Cells Wet Prep HPF POC NONE SEEN  NONE SEEN    WBC, Wet Prep HPF POC MODERATE (*) NONE SEEN    US Transvaginal Non-ob  03/15/2012  *RADIOLOGY REPORT*  Clinical Data:  Right lower abdominal/pelvic pain, evaluate for torsion  TRANSABDOMINAL AND TRANSVAGINAL ULTRASOUND OF PELVIS DOPPLER ULTRASOUND OF OVARIES  Technique:  Both transabdominal and transvaginal ultrasound examinations of the pelvis were performed. Transabdominal technique was performed for global imaging of the pelvis including uterus, ovaries, adnexal regions, and pelvic cul-de-sac.  It was necessary to proceed with endovaginal exam following the transabdominal exam to visualize the endometrium.  Color and duplex Doppler ultrasound was utilized to evaluate blood flow to the ovaries.  Comparison:  None.  Findings:  Uterus:  Normal in size and appearance, measuring 7.6 x 4.2 x 4.6 cm.  Endometrium:  Normal in thickness and appearance, measuring 4 mm. IUD in satisfactory position.  Right ovary: Measures 3.6 x 2.3 x 3.1 cm and is notable for a 2.8 x 2.1 x 2.7 cm probable corpus luteal cyst.  Left ovary:   Normal appearance/no adnexal mass, measuring 2.6 x 1.1 x 1.6 cm.  Additional comments:  Small free fluid.  Pulsed Doppler evaluation demonstrates normal low-resistance arterial and venous waveforms in both ovaries.  IMPRESSION: 2.8 cm probable right corpus luteal cyst.  No sonographic evidence for ovarian torsion.  IUD in satisfactory position.  Original Report  Authenticated By: Charline Bills, M.D.   US Pelvis Complete  03/15/2012  *RADIOLOGY REPORT*  Clinical Data:  Right lower abdominal/pelvic pain, evaluate for torsion  TRANSABDOMINAL AND TRANSVAGINAL ULTRASOUND OF PELVIS DOPPLER ULTRASOUND OF OVARIES  Technique:  Both transabdominal and transvaginal ultrasound examinations of the pelvis were performed. Transabdominal technique was performed for global imaging of the pelvis including uterus, ovaries, adnexal regions, and pelvic cul-de-sac.  It was necessary to proceed with endovaginal exam following the transabdominal exam to visualize the endometrium.  Color and duplex Doppler ultrasound was utilized to evaluate blood flow to the ovaries.  Comparison:  None.  Findings:  Uterus:  Normal in size and appearance, measuring 7.6 x 4.2 x 4.6 cm.  Endometrium:  Normal in thickness and appearance, measuring 4 mm. IUD in satisfactory position.  Right  ovary: Measures 3.6 x 2.3 x 3.1 cm and is notable for a 2.8 x 2.1 x 2.7 cm probable corpus luteal cyst.  Left ovary:   Normal appearance/no adnexal mass, measuring 2.6 x 1.1 x 1.6 cm.  Additional comments:  Small free fluid.  Pulsed Doppler evaluation demonstrates normal low-resistance arterial and venous waveforms in both ovaries.  IMPRESSION: 2.8 cm probable right corpus luteal cyst.  No sonographic evidence for ovarian torsion.  IUD in satisfactory position.  Original Report Authenticated By: Charline Bills, M.D.   [place] Art/ven Flow Abd Pelv Doppler  03/15/2012  *RADIOLOGY REPORT*  Clinical Data:  Right lower abdominal/pelvic pain, evaluate for torsion  TRANSABDOMINAL AND TRANSVAGINAL ULTRASOUND OF PELVIS DOPPLER ULTRASOUND OF OVARIES  Technique:  Both transabdominal and transvaginal ultrasound examinations of the pelvis were performed. Transabdominal technique was performed for global imaging of the pelvis including uterus, ovaries, adnexal regions, and pelvic cul-de-sac.  It was necessary to proceed with endovaginal  exam following the transabdominal exam to visualize the endometrium.  Color and duplex Doppler ultrasound was utilized to evaluate blood flow to the ovaries.  Comparison:  None.  Findings:  Uterus:  Normal in size and appearance, measuring 7.6 x 4.2 x 4.6 cm.  Endometrium:  Normal in thickness and appearance, measuring 4 mm. IUD in satisfactory position.  Right ovary: Measures 3.6 x 2.3 x 3.1 cm and is notable for a 2.8 x 2.1 x 2.7 cm probable corpus luteal cyst.  Left ovary:   Normal appearance/no adnexal mass, measuring 2.6 x 1.1 x 1.6 cm.  Additional comments:  Small free fluid.  Pulsed Doppler evaluation demonstrates normal low-resistance arterial and venous waveforms in both ovaries.  IMPRESSION: 2.8 cm probable right corpus luteal cyst.  No sonographic evidence for ovarian torsion.  IUD in satisfactory position.  Original Report Authenticated By: Charline Bills, M.D.      MDM  Confirmed nkda. Pt has ride, does not have to drive. Motrin po, vicodin po, zofran po.   Recheck pt feels improved. Discussed u/s.   Repeat abd exam, soft nt.   Will plan recheck tomorrow morning if symptoms fail to improve/resolve (sooner if worsening pain, fevers, or persistent vomiting).       Suzi Roots, MD 03/15/12 2038

## 2012-04-17 ENCOUNTER — Ambulatory Visit (INDEPENDENT_AMBULATORY_CARE_PROVIDER_SITE_OTHER): Payer: BC Managed Care – PPO | Admitting: Physician Assistant

## 2012-04-17 ENCOUNTER — Other Ambulatory Visit: Payer: Self-pay | Admitting: Physician Assistant

## 2012-04-17 ENCOUNTER — Encounter: Payer: Self-pay | Admitting: Physician Assistant

## 2012-04-17 VITALS — BP 100/67 | HR 75 | Temp 98.2°F | Resp 16 | Ht 61.5 in | Wt 118.2 lb

## 2012-04-17 DIAGNOSIS — R300 Dysuria: Secondary | ICD-10-CM

## 2012-04-17 DIAGNOSIS — N898 Other specified noninflammatory disorders of vagina: Secondary | ICD-10-CM

## 2012-04-17 DIAGNOSIS — R350 Frequency of micturition: Secondary | ICD-10-CM

## 2012-04-17 LAB — POCT UA - MICROSCOPIC ONLY
Casts, Ur, LPF, POC: NEGATIVE
Crystals, Ur, HPF, POC: NEGATIVE
Mucus, UA: NEGATIVE
Yeast, UA: NEGATIVE

## 2012-04-17 LAB — POCT WET PREP WITH KOH
KOH Prep POC: NEGATIVE
Trichomonas, UA: NEGATIVE
Yeast Wet Prep HPF POC: NEGATIVE

## 2012-04-17 LAB — POCT URINALYSIS DIPSTICK
Bilirubin, UA: NEGATIVE
Glucose, UA: NEGATIVE
Ketones, UA: NEGATIVE
Nitrite, UA: NEGATIVE
Protein, UA: NEGATIVE
Spec Grav, UA: <=1.005
Urobilinogen, UA: 0.2
pH, UA: 6.5

## 2012-04-17 MED ORDER — CIPROFLOXACIN HCL 500 MG PO TABS: 500.0000 mg | ORAL_TABLET | Freq: Two times a day (BID) | ORAL | Status: AC

## 2012-04-17 NOTE — Patient Instructions (Signed)

## 2012-04-17 NOTE — Progress Notes (Signed)
Precepted with Ms. Marte, PA-C and agree.  Urine culture ordered and pending.

## 2012-04-17 NOTE — Progress Notes (Signed)
  Subjective:    Patient ID: Evelyn [name], female    DOB: 1986-02-15, 26 y.o.   MRN: [id number]  HPI: Patient presents with urinary frequency and slight dysuria x 1 week. Denies backache, fever, chills, nausea, or vomiting. She complains of slight vaginal discharge but no odor, abdominal pain, or pelvic pain.  She is also concerned about her IUD. States she has not been able to feel the strings and [facility] they could not see the strings. However, 1 month ago she went to the ED for abdominal pain and an US showed the IUD to be in place.     Review of Systems  Gastrointestinal: Negative for nausea, vomiting and abdominal pain.  Genitourinary: Positive for dysuria, frequency and vaginal discharge. Negative for hematuria, flank pain, difficulty urinating, vaginal pain, menstrual problem and pelvic pain.       Objective:   Physical Exam  Vitals reviewed. Constitutional: She is oriented to person, place, and time. She appears well-developed and well-nourished.  HENT:  Head: Normocephalic and atraumatic.  Right Ear: External ear normal.  Left Ear: External ear normal.  Cardiovascular: Normal rate, regular rhythm and normal heart sounds.   Pulmonary/Chest: Effort normal and breath sounds normal.  Abdominal: Soft. Bowel sounds are normal. There is no tenderness.  Genitourinary: Vagina normal. Vaginal discharge: minimal white cervical discharge. IUD strings visualized.  Neurological: She is alert and oriented to person, place, and time.      Results for orders placed in visit on 04/17/12  POCT URINALYSIS DIPSTICK      Component Value Range   Color, UA yellow     Clarity, UA hazy     Glucose, UA neg     Bilirubin, UA neg     Ketones, UA neg     Spec Grav, UA <=1.005     Blood, UA trace     pH, UA 6.5     Protein, UA neg     Urobilinogen, UA 0.2     Nitrite, UA neg     Leukocytes, UA moderate (2+)    POCT WET PREP WITH KOH      Component Value  Range   Trichomonas, UA Negative     Clue Cells Wet Prep HPF POC 4-10     Epithelial Wet Prep HPF POC 5-15     Yeast Wet Prep HPF POC neg     Bacteria Wet Prep HPF POC 3+ cocci     RBC Wet Prep HPF POC 0-1     WBC Wet Prep HPF POC 0-3     KOH Prep POC Negative    POCT UA - MICROSCOPIC ONLY      Component Value Range   WBC, Ur, HPF, POC 5-6     RBC, urine, microscopic 1-3     Bacteria, U Microscopic 1+     Mucus, UA neg     Epithelial cells, urine per micros 3-4     Crystals, Ur, HPF, POC neg     Casts, Ur, LPF, POC neg     Yeast, UA neg         Assessment & Plan:  1. Urinary Tract Infection  Cipro 500 mg bid x 3 days  Increase fluids   Follow up if continued symptoms, fever, chills, abdominal pain, nausea or vomiting

## 2012-04-20 LAB — URINE CULTURE: Colony Count: >=100000

## 2012-06-23 ENCOUNTER — Telehealth: Payer: Self-pay

## 2012-06-23 NOTE — Telephone Encounter (Signed)
Pt went to emergency room at [place] for severe stomach pain over the weekend.  They referred her to a gastroenterologist in [facility].   She is a patient here. (saw Launa Flight most of the time)  She would like to see a gastroenterologist in [place],.  ER thought it might be an ulcer. She is still in a lot of pain.

## 2012-06-23 NOTE — Telephone Encounter (Signed)
Pt notified that she needs to contact ED for a change of GI.

## 2012-11-03 ENCOUNTER — Encounter: Payer: Self-pay | Admitting: Advanced Practice Midwife

## 2012-11-19 ENCOUNTER — Encounter: Payer: BC Managed Care – PPO | Admitting: Advanced Practice Midwife

## 2013-07-18 ENCOUNTER — Ambulatory Visit (INDEPENDENT_AMBULATORY_CARE_PROVIDER_SITE_OTHER): Payer: BC Managed Care – PPO | Admitting: Family Medicine

## 2013-07-18 VITALS — BP 98/46 | HR 76 | Temp 99.1°F | Resp 16 | Ht 62.0 in | Wt 120.0 lb

## 2013-07-18 DIAGNOSIS — J209 Acute bronchitis, unspecified: Secondary | ICD-10-CM

## 2013-07-18 MED ORDER — AZITHROMYCIN 250 MG PO TABS: ORAL_TABLET | ORAL | Status: DC

## 2013-07-18 MED ORDER — HYDROCODONE-HOMATROPINE 5-1.5 MG/5ML PO SYRP: 5.0000 mL | ORAL_SOLUTION | Freq: Three times a day (TID) | ORAL | Status: DC | PRN

## 2013-07-18 NOTE — Progress Notes (Signed)
Patient ID: Evelyn [name] MRN: [id number], DOB: October 29, 1986, 27 y.o. Date of Encounter: 07/18/2013, 3:14 PM  Primary Physician: Provider Not In System  Chief Complaint:  Chief Complaint  Patient presents with  . Cough    x 5-6 days    HPI: 27 y.o. year old female presents with a 5 day history of nasal congestion, post nasal drip, sore throat, and cough. Mild sinus pressure. Afebrile. No chills. Nasal congestion thick and green/yellow. Cough is productive of green/yellow sputum and not associated with time of day. Ears feel full, leading to sensation of muffled hearing. Has tried OTC cold preps without success. No GI complaints.   No sick contacts, recent antibiotics, or recent travels.   No leg trauma, sedentary periods, h/o cancer, or tobacco use.  No past medical history on file.   Home Meds: Prior to Admission medications   Medication Sig Start Date End Date Taking? Authorizing Provider  levonorgestrel (MIRENA) 20 MCG/24HR IUD 1 each by Intrauterine route once.   Yes Historical Provider, MD  valACYclovir (VALTREX) 500 MG tablet TAKE 1 TABLET EVERY DAY 04/17/12  Yes Heather M Marte, PA-C  azithromycin (ZITHROMAX Z-PAK) 250 MG tablet Take as directed on pack 07/18/13   Elvina Sidle, MD  HYDROcodone-homatropine [facility]) 5-1.5 MG/5ML syrup Take 5 mL by mouth every 8 (eight) hours as needed for cough. 07/18/13   Elvina Sidle, MD    Allergies: No Known Allergies  History   Social History  . Marital Status: Married    Spouse Name: N/A    Number of Children: N/A  . Years of Education: N/A   Occupational History  . Not on file.   Social History Main Topics  . Smoking status: Never Smoker   . Smokeless tobacco: Not on file  . Alcohol Use: Not on file  . Drug Use: No  . Sexual Activity: Yes    Partners: Male   Other Topics Concern  . Not on file   Social History Narrative  . No narrative on file     Review of Systems: Constitutional: negative for chills,  fever, night sweats or weight changes Cardiovascular: negative for chest pain or palpitations Respiratory: negative for hemoptysis, wheezing, or shortness of breath Abdominal: negative for abdominal pain, nausea, vomiting or diarrhea Dermatological: negative for rash Neurologic: negative for headache   Physical Exam: Blood pressure 98/46, pulse 76, temperature 99.1 F (37.3 C), resp. rate 16, height 5\' 2"  (1.575 m), weight 120 lb (54.432 kg)., Body mass index is 21.94 kg/(m^2). General: Well developed, well nourished, in no acute distress. Head: Normocephalic, atraumatic, eyes without discharge, sclera non-icteric, nares are congested. Bilateral auditory canals clear, TM's are without perforation, pearly grey with reflective cone of light bilaterally. No sinus TTP. Oral cavity moist, dentition normal. Posterior pharynx with post nasal drip and mild erythema. No peritonsillar abscess or tonsillar exudate. Neck: Supple. No thyromegaly. Full ROM. No lymphadenopathy. Lungs: Coarse breath sounds bilaterally without wheezes, rales, or rhonchi. Breathing is unlabored.  Heart: RRR with S1 S2. No murmurs, rubs, or gallops appreciated. Msk:  Strength and tone normal for age. Extremities: No clubbing or cyanosis. No edema. Neuro: Alert and oriented X 3. Moves all extremities spontaneously. CNII-XII grossly in tact. Psych:  Responds to questions appropriately with a normal affect.   Labs:   ASSESSMENT AND PLAN:  27 y.o. year old female with bronchitis. -Acute bronchitis - Plan: azithromycin (ZITHROMAX Z-PAK) 250 MG tablet, HYDROcodone-homatropine (HYCODAN) 5-1.5 MG/5ML syrup   -Mucinex -Tylenol/Motrin prn -  Rest/fluids -RTC precautions -RTC 3-5 days if no improvement  Signed, Elvina Sidle, MD 07/18/2013 3:14 PM

## 2013-07-31 ENCOUNTER — Encounter: Payer: BC Managed Care – PPO | Admitting: Family Medicine

## 2013-09-18 ENCOUNTER — Other Ambulatory Visit: Payer: Self-pay | Admitting: Obstetrics and Gynecology

## 2013-10-09 ENCOUNTER — Encounter (HOSPITAL_COMMUNITY): Payer: Self-pay | Admitting: Emergency Medicine

## 2013-10-09 ENCOUNTER — Emergency Department (INDEPENDENT_AMBULATORY_CARE_PROVIDER_SITE_OTHER): Payer: BC Managed Care – PPO

## 2013-10-09 ENCOUNTER — Emergency Department (INDEPENDENT_AMBULATORY_CARE_PROVIDER_SITE_OTHER)
Admission: EM | Admit: 2013-10-09 | Discharge: 2013-10-09 | Disposition: A | Discharge status: Home / Self Care | Payer: BC Managed Care – PPO | Source: Home / Self Care | Attending: Family Medicine | Admitting: Family Medicine

## 2013-10-09 DIAGNOSIS — M545 Low back pain: Secondary | ICD-10-CM

## 2013-10-09 MED ORDER — CYCLOBENZAPRINE HCL 10 MG PO TABS: 10.0000 mg | ORAL_TABLET | Freq: Every evening | ORAL | Status: DC | PRN

## 2013-10-09 MED ORDER — MELOXICAM 15 MG PO TABS: 15.0000 mg | ORAL_TABLET | Freq: Every day | ORAL | Status: DC | PRN

## 2013-10-09 NOTE — ED Provider Notes (Signed)
Evelyn [name] is a 27 y.o. female who presents to Urgent Care today for mid back pain. Patient has had mid back pain without injury for the last 3 days. She denies any radiating pain weakness or numbness. The pain is worse with activity better with rest. She notes the pain is moderate in intensity. Pain is located right on the midline. She has tried some Tylenol which does not help much. No fevers or chills nausea vomiting or diarrhea.   History reviewed. No pertinent past medical history. History  Substance Use Topics  . Smoking status: Never Smoker   . Smokeless tobacco: Not on file  . Alcohol Use: Not on file   ROS as above Medications reviewed. No current facility-administered medications for this encounter.   Current Outpatient Prescriptions  Medication Sig Dispense Refill  . cyclobenzaprine (FLEXERIL) 10 MG tablet Take 1 tablet (10 mg total) by mouth at bedtime as needed for muscle spasms.  20 tablet  0  . levonorgestrel (MIRENA) 20 MCG/24HR IUD 1 each by Intrauterine route once.      . meloxicam (MOBIC) 15 MG tablet Take 1 tablet (15 mg total) by mouth daily as needed for pain.  30 tablet  0    Exam:  BP 119/73  Pulse 59  Temp(Src) 98.3 F (36.8 C) (Oral)  Resp 16  SpO2 98% Gen: Well NAD HEENT: EOMI,  MMM Lungs: CTABL Nl WOB Heart: RRR no MRG Abd: NABS, NT, ND Exts: Non edematous BL  LE, warm and well perfused.  Back: Tender on spinal midline upper lumbar, or low thoracic back pain.  Normal range of motion.  Strength is intact bilateral lower extremities as is sensation.   No results found for this or any previous visit (from the past 24 hour(s)). Dg Thoracic Spine 2 View  10/09/2013   CLINICAL DATA:  Midline spine pain for 3 days  EXAM: THORACIC SPINE - 2 VIEW  COMPARISON:  None.  FINDINGS: A BB marks the area of tenderness. Thoracic spine shows normal anterior-posterior alignment. No fracture. No paraspinous hematoma. No significant degenerative change in the  thoracic spine.  IMPRESSION: No significant abnormalities.   Electronically Signed   By: Esperanza Heir M.D.   On: 10/09/2013 20:58    Assessment and Plan: 27 y.o. female with lumbar pain.  X-rays are normal.  Plan to treat with meloxicam and Flexeril. Additionally we'll use a heating pad and  followup with sports medicine if not improving.      Rodolph Bong, MD 10/09/13 2106

## 2013-10-09 NOTE — ED Notes (Signed)
Pt c/o lower back pain onset Tuesday Pain is constant and increases w/activity Denies: abd pain, f/v/n/d, hematuria, vag d/c, inj/trauma, strenuous activity Alert w/no signs of acute distress... Ambulated to exam room w/NAD

## 2014-01-14 ENCOUNTER — Ambulatory Visit (INDEPENDENT_AMBULATORY_CARE_PROVIDER_SITE_OTHER): Payer: BC Managed Care – PPO | Admitting: Family Medicine

## 2014-01-14 ENCOUNTER — Encounter: Payer: Self-pay | Admitting: Family Medicine

## 2014-01-14 VITALS — BP 104/60 | HR 67 | Temp 98.6°F | Resp 16 | Ht 62.0 in | Wt 118.6 lb

## 2014-01-14 DIAGNOSIS — J309 Allergic rhinitis, unspecified: Secondary | ICD-10-CM

## 2014-01-14 DIAGNOSIS — Z0000 Encounter for general adult medical examination without abnormal findings: Secondary | ICD-10-CM

## 2014-01-14 DIAGNOSIS — G8929 Other chronic pain: Secondary | ICD-10-CM

## 2014-01-14 DIAGNOSIS — M549 Dorsalgia, unspecified: Secondary | ICD-10-CM

## 2014-01-14 LAB — CBC WITH DIFFERENTIAL/PLATELET
BASOS ABS: 0 10*3/uL (ref 0.0–0.1)
BASOS PCT: 0 % (ref 0–1)
EOS ABS: 0.1 10*3/uL (ref 0.0–0.7)
EOS PCT: 2 % (ref 0–5)
HEMATOCRIT: 36.4 % (ref 36.0–46.0)
Hemoglobin: 12.3 g/dL (ref 12.0–15.0)
Lymphocytes Relative: 35 % (ref 12–46)
Lymphs Abs: 1.9 10*3/uL (ref 0.7–4.0)
MCH: 29.9 pg (ref 26.0–34.0)
MCHC: 33.8 g/dL (ref 30.0–36.0)
MCV: 88.3 fL (ref 78.0–100.0)
MONO ABS: 0.4 10*3/uL (ref 0.1–1.0)
Monocytes Relative: 8 % (ref 3–12)
NEUTROS ABS: 2.9 10*3/uL (ref 1.7–7.7)
Neutrophils Relative %: 55 % (ref 43–77)
Platelets: 299 10*3/uL (ref 150–400)
RBC: 4.12 MIL/uL (ref 3.87–5.11)
RDW: 13.5 % (ref 11.5–15.5)
WBC: 5.3 10*3/uL (ref 4.0–10.5)

## 2014-01-14 LAB — LIPID PANEL
CHOLESTEROL: 178 mg/dL (ref 0–200)
HDL: 52 mg/dL (ref >39)
LDL Cholesterol: 116 mg/dL — ABNORMAL HIGH (ref 0–99)
Total CHOL/HDL Ratio: 3.4 Ratio
Triglycerides: 49 mg/dL (ref <150)
VLDL: 10 mg/dL (ref 0–40)

## 2014-01-14 LAB — COMPLETE METABOLIC PANEL WITH GFR
ALBUMIN: 4.3 g/dL (ref 3.5–5.2)
ALK PHOS: 77 U/L (ref 39–117)
ALT: 16 U/L (ref 0–35)
AST: 17 U/L (ref 0–37)
BUN: 12 mg/dL (ref 6–23)
CO2: 27 meq/L (ref 19–32)
Calcium: 9.4 mg/dL (ref 8.4–10.5)
Chloride: 103 mEq/L (ref 96–112)
Creat: 0.54 mg/dL (ref 0.50–1.10)
GLUCOSE: 77 mg/dL (ref 70–99)
POTASSIUM: 3.9 meq/L (ref 3.5–5.3)
SODIUM: 137 meq/L (ref 135–145)
TOTAL PROTEIN: 7.4 g/dL (ref 6.0–8.3)
Total Bilirubin: 0.5 mg/dL (ref 0.2–1.2)

## 2014-01-14 LAB — POCT URINALYSIS DIPSTICK
Bilirubin, UA: NEGATIVE
Glucose, UA: NEGATIVE
Ketones, UA: NEGATIVE
LEUKOCYTES UA: NEGATIVE
Nitrite, UA: NEGATIVE
PROTEIN UA: NEGATIVE
Spec Grav, UA: 1.02
UROBILINOGEN UA: 0.2
pH, UA: 7.5

## 2014-01-14 NOTE — Patient Instructions (Signed)

## 2014-01-14 NOTE — Progress Notes (Signed)
Subjective:    Patient ID: Evelyn [name], female    DOB: 07/29/1986, 28 y.o.   MRN: [id number]  HPI  This 28 y.o. Hispanic female is here for CPE; she and her husband are trying to conceive a 4th child. Pt had period since IUD removed last month by GYN. She has some minor allergy symptoms but is avoiding any unnecesary medications.   Patient Active Problem List   Diagnosis Date Noted  . Chronic back pain 01/14/2014  . Allergic rhinitis 01/14/2014    Prior to Admission medications   Medication Sig Start Date End Date Taking? Authorizing Provider  valACYclovir (VALTREX) 500 MG tablet Take 500 mg by mouth 2 (two) times daily.   Yes Historical Provider, MD   PMHx. Surg Hx, Soc and Fam Hx reviewed.   Review of Systems  Constitutional: Negative.   HENT: Negative.   Eyes: Negative.   Respiratory: Negative.   Cardiovascular: Negative.   Gastrointestinal: Positive for nausea.  Endocrine: Negative.   Genitourinary: Negative.   Musculoskeletal: Negative.   Skin: Negative.   Allergic/Immunologic: Negative.   Neurological: Negative.   Hematological: Negative.   Psychiatric/Behavioral: Negative.       Objective:   Physical Exam  Nursing note and vitals reviewed. Constitutional: She is oriented to person, place, and time. Vital signs are normal. She appears well-developed and well-nourished. No distress.  HENT:  Head: Normocephalic and atraumatic.  Right Ear: Hearing, tympanic membrane, external ear and ear canal normal.  Left Ear: Hearing, tympanic membrane, external ear and ear canal normal.  Nose: Nose normal. No rhinorrhea, nasal deformity or septal deviation.  Mouth/Throat: Uvula is midline, oropharynx is clear and moist and mucous membranes are normal. No oral lesions. Normal dentition. No dental caries.  Eyes: Conjunctivae, EOM and lids are normal. Pupils are equal, round, and reactive to light. No scleral icterus.  Fundoscopic exam:      The right eye shows no arteriolar  narrowing, no AV nicking and no papilledema. The right eye shows red reflex.       The left eye shows no arteriolar narrowing, no AV nicking and no papilledema. The left eye shows red reflex.  Neck: Trachea normal, normal range of motion and full passive range of motion without pain. Neck supple. No spinous process tenderness and no muscular tenderness present. No mass and no thyromegaly present.  Cardiovascular: Normal rate, regular rhythm, S1 normal, S2 normal, normal heart sounds and normal pulses.   No extrasystoles are present. PMI is not displaced.  Exam reveals no gallop and no friction rub.   No murmur heard. Pulmonary/Chest: Effort normal and breath sounds normal. No respiratory distress.  Abdominal: Soft. Normal appearance and bowel sounds are normal. She exhibits no distension and no mass. There is no hepatosplenomegaly. There is no tenderness. There is no guarding and no CVA tenderness.  Genitourinary:  Deferred.  Musculoskeletal: Normal range of motion. She exhibits no edema and no tenderness.       Cervical back: Normal.       Thoracic back: Normal.       Lumbar back: She exhibits spasm. She exhibits normal range of motion, no bony tenderness, no deformity and no pain.  Lymphadenopathy:       Head (right side): No submental, no submandibular, no tonsillar, no posterior auricular and no occipital adenopathy present.       Head (left side): No submental, no submandibular, no tonsillar, no posterior auricular and no occipital adenopathy present.  She has no cervical adenopathy.       Right: No inguinal and no supraclavicular adenopathy present.       Left: No inguinal and no supraclavicular adenopathy present.  Neurological: She is alert and oriented to person, place, and time. She has normal strength and normal reflexes. She displays no atrophy and no tremor. No cranial nerve deficit or sensory deficit. She exhibits normal muscle tone. Coordination and gait normal.  Skin: Skin is  warm, dry and intact. No ecchymosis, no lesion and no rash noted. She is not diaphoretic. No cyanosis or erythema. No pallor. Nails show no clubbing.  Psychiatric: She has a normal mood and affect. Her speech is normal and behavior is normal. Judgment and thought content normal. Cognition and memory are normal.      Assessment & Plan:  Routine general medical examination at a health care facility - Plan: POCT urinalysis dipstick, Lipid panel, Vit D  25 hydroxy (rtn osteoporosis monitoring), COMPLETE METABOLIC PANEL WITH GFR, CBC with Differential  Chronic back pain- Suspect lumbar strain and spasms. Practice good posture and stretching exercises.  Allergic rhinitis- Recommend OTC AYR saline nasal mist.

## 2014-01-15 LAB — VITAMIN D 25 HYDROXY (VIT D DEFICIENCY, FRACTURES): Vit D, 25-Hydroxy: 27 ng/mL — ABNORMAL LOW (ref 30–89)

## 2014-01-15 NOTE — Progress Notes (Signed)
Quick Note:  Please advise pt regarding following labs...  Labs look good. Chemistries, blood sugar, kidney and liver function tests are all normal. Blood counts are normal. LDL ("bad") cholesterol is a little above normal. Healthy nutrition and regular exercise will correct this.  Vitamin D is below normal. Get a good multivitamin that has at least 800- 1000 units of Vitamin D and take it daily with a meal. Try to consume more salmon, tuna and other healthy fish, yogurt and other dairy, eggs and mushrooms. Try to get 15 minutes of sun exposure most days of the week.  Copy to pt. ______

## 2014-03-23 ENCOUNTER — Other Ambulatory Visit: Payer: Self-pay | Admitting: Obstetrics and Gynecology

## 2014-03-23 ENCOUNTER — Other Ambulatory Visit: Payer: Self-pay

## 2014-04-02 ENCOUNTER — Ambulatory Visit: Payer: BC Managed Care – PPO | Attending: Orthopaedic Surgery

## 2014-04-02 DIAGNOSIS — IMO0001 Reserved for inherently not codable concepts without codable children: Secondary | ICD-10-CM | POA: Insufficient documentation

## 2014-04-02 DIAGNOSIS — R293 Abnormal posture: Secondary | ICD-10-CM | POA: Insufficient documentation

## 2014-04-02 DIAGNOSIS — R262 Difficulty in walking, not elsewhere classified: Secondary | ICD-10-CM | POA: Insufficient documentation

## 2014-04-02 DIAGNOSIS — M25559 Pain in unspecified hip: Secondary | ICD-10-CM | POA: Insufficient documentation

## 2014-04-12 ENCOUNTER — Ambulatory Visit: Payer: BC Managed Care – PPO

## 2014-04-15 ENCOUNTER — Encounter: Payer: BC Managed Care – PPO | Admitting: Rehabilitation

## 2014-04-19 ENCOUNTER — Encounter: Payer: BC Managed Care – PPO | Admitting: Rehabilitation

## 2014-04-21 ENCOUNTER — Encounter: Payer: BC Managed Care – PPO | Admitting: Rehabilitation

## 2014-04-22 LAB — OB RESULTS CONSOLE HIV ANTIBODY (ROUTINE TESTING): HIV: NONREACTIVE

## 2014-04-22 LAB — OB RESULTS CONSOLE RUBELLA ANTIBODY, IGM: Rubella: IMMUNE

## 2014-04-22 LAB — OB RESULTS CONSOLE HEPATITIS B SURFACE ANTIGEN: Hepatitis B Surface Ag: NEGATIVE

## 2014-06-15 ENCOUNTER — Other Ambulatory Visit (HOSPITAL_COMMUNITY): Payer: Self-pay | Admitting: Obstetrics and Gynecology

## 2014-06-15 DIAGNOSIS — O283 Abnormal ultrasonic finding on antenatal screening of mother: Secondary | ICD-10-CM

## 2014-06-24 ENCOUNTER — Encounter (HOSPITAL_COMMUNITY): Payer: Self-pay

## 2014-06-24 ENCOUNTER — Ambulatory Visit (HOSPITAL_COMMUNITY)
Admission: RE | Admit: 2014-06-24 | Discharge: 2014-06-24 | Disposition: A | Discharge status: Home / Self Care | Payer: BC Managed Care – PPO | Source: Ambulatory Visit | Attending: Obstetrics and Gynecology | Admitting: Obstetrics and Gynecology

## 2014-06-24 VITALS — BP 118/63 | HR 67 | Wt 126.5 lb

## 2014-06-24 DIAGNOSIS — O283 Abnormal ultrasonic finding on antenatal screening of mother: Secondary | ICD-10-CM

## 2014-06-24 DIAGNOSIS — Z3689 Encounter for other specified antenatal screening: Secondary | ICD-10-CM | POA: Insufficient documentation

## 2014-06-24 DIAGNOSIS — O289 Unspecified abnormal findings on antenatal screening of mother: Secondary | ICD-10-CM | POA: Insufficient documentation

## 2014-06-24 HISTORY — DX: Herpesviral infection, unspecified: B00.9

## 2014-07-01 ENCOUNTER — Telehealth (HOSPITAL_COMMUNITY): Payer: Self-pay | Admitting: MS"

## 2014-07-01 NOTE — Telephone Encounter (Signed)
Called Evelyn [name] to discuss her cell free fetal DNA test results.  Evelyn [name] had [facility] testing through [facility].  Testing was offered because of abnormal ultrasound findings.   The patient was identified by name and DOB.  We reviewed that these are within normal limits, showing a less than 1 in 10,000 risk for trisomies 21, 18 and 13, and monosomy X (Turner syndrome).  In addition, the risk for triploidy/vanishing twin and sex chromosome trisomies (47,XXX and 47,XXY) was also low risk.We reviewed that this testing identifies > 99% of pregnancies with trisomy 63, trisomy 66, sex chromosome trisomies (47,XXX and 47,XXY), and triploidy. The detection rate for trisomy 18 is 96%.  The detection rate for monosomy X is ~92%.  The false positive rate is <0.1% for all conditions. Testing was also consistent with female fetal sex.  She understands that this testing does not identify all genetic conditions.    She inquired about pediatric urology visit. Explained that our administrative staff is working to coordinate this with the [facility] pediatric urologist. Patient should be contacted regarding this appointment. She will plan to inquire about it at her 8/19 ultrasound if she does not hear about appointment prior to that. All questions were answered to her satisfaction, she was encouraged to call with additional questions or concerns.  Chipper Oman, MS Insurance risk surveyor

## 2014-07-21 ENCOUNTER — Encounter (HOSPITAL_COMMUNITY): Payer: Self-pay

## 2014-07-21 ENCOUNTER — Ambulatory Visit (HOSPITAL_COMMUNITY)
Admission: RE | Admit: 2014-07-21 | Discharge: 2014-07-21 | Disposition: A | Discharge status: Home / Self Care | Payer: BC Managed Care – PPO | Source: Ambulatory Visit | Attending: Obstetrics and Gynecology | Admitting: Obstetrics and Gynecology

## 2014-07-21 VITALS — BP 109/63 | HR 56 | Wt 131.0 lb

## 2014-07-21 DIAGNOSIS — O358XX0 Maternal care for other (suspected) fetal abnormality and damage, not applicable or unspecified: Secondary | ICD-10-CM

## 2014-07-21 DIAGNOSIS — O99891 Other specified diseases and conditions complicating pregnancy: Secondary | ICD-10-CM | POA: Insufficient documentation

## 2014-07-21 DIAGNOSIS — O289 Unspecified abnormal findings on antenatal screening of mother: Secondary | ICD-10-CM | POA: Diagnosis not present

## 2014-07-21 DIAGNOSIS — Z3689 Encounter for other specified antenatal screening: Secondary | ICD-10-CM | POA: Insufficient documentation

## 2014-07-21 DIAGNOSIS — O283 Abnormal ultrasonic finding on antenatal screening of mother: Secondary | ICD-10-CM

## 2014-07-21 DIAGNOSIS — O9989 Other specified diseases and conditions complicating pregnancy, childbirth and the puerperium: Principal | ICD-10-CM

## 2014-07-31 ENCOUNTER — Ambulatory Visit (INDEPENDENT_AMBULATORY_CARE_PROVIDER_SITE_OTHER): Payer: BC Managed Care – PPO | Admitting: Internal Medicine

## 2014-07-31 VITALS — BP 102/68 | HR 74 | Temp 97.5°F | Resp 18 | Ht 62.0 in | Wt 135.0 lb

## 2014-07-31 DIAGNOSIS — J029 Acute pharyngitis, unspecified: Secondary | ICD-10-CM

## 2014-07-31 DIAGNOSIS — J069 Acute upper respiratory infection, unspecified: Secondary | ICD-10-CM

## 2014-07-31 LAB — POCT RAPID STREP A (OFFICE): RAPID STREP A SCREEN: NEGATIVE

## 2014-07-31 MED ORDER — IPRATROPIUM BROMIDE 0.03 % NA SOLN: 2.0000 | Freq: Two times a day (BID) | NASAL | Status: DC

## 2014-07-31 NOTE — Progress Notes (Signed)
Subjective:  This chart was scribed for Tami Lin, MD by Donato Schultz, Medical Scribe. This patient was seen in Room 2 and the patient's care was started at 3:50 PM.   Patient ID: Evelyn [name], female    DOB: 19-Oct-1986, 28 y.o.   MRN: [id number]  HPI HPI Comments: Evelyn [name] is a 29 y.o. female who presents to the [facility] complaining of constant sore throat, ear pain, and sinus pressure that started this morning when she woke up.  She lists fever, sneezing, cough, and rhinorrhea associated symptoms.  She denies sick contacts.  She is [redacted] weeks pregnant and has been healthy throughout her pregnancy.  .    Past Medical History  Diagnosis Date  . HSV (herpes simplex virus) infection    Past Surgical History  Procedure Laterality Date  . Wisdom tooth extraction    . Hernia repair     History reviewed. No pertinent family history. History   Social History  . Marital Status: Married    Spouse Name: N/A    Number of Children: N/A  . Years of Education: N/A   Occupational History  . Not Employed    Social History Main Topics  . Smoking status: Never Smoker   . Smokeless tobacco: Not on file  . Alcohol Use: No  . Drug Use: No  . Sexual Activity: Yes    Partners: Male   Other Topics Concern  . Not on file   Social History Narrative   Married. Education: [employer]. Pt does not exercise.   No Known Allergies  Review of Systems  Constitutional: Positive for fever.  HENT: Positive for congestion, ear pain, rhinorrhea, sinus pressure, sneezing and sore throat.   Respiratory: Positive for cough.      Objective:  Physical Exam  Nursing note and vitals reviewed. Constitutional: She is oriented to person, place, and time. She appears well-developed and well-nourished. No distress.  HENT:  Head: Normocephalic and atraumatic.  Right Ear: Tympanic membrane normal.  Left Ear: Tympanic membrane normal.  Nose: Rhinorrhea present.    Mouth/Throat: Posterior oropharyngeal erythema present. No posterior oropharyngeal edema.  Eyes: Conjunctivae and EOM are normal. Pupils are equal, round, and reactive to light.  Neck: Neck supple.  Cardiovascular: Normal rate, regular rhythm and normal heart sounds.  Exam reveals no gallop and no friction rub.   No murmur heard. Pulmonary/Chest: Effort normal and breath sounds normal. No respiratory distress. She has no wheezes. She has no rales.  Lymphadenopathy:    She has no cervical adenopathy.  Neurological: She is alert and oriented to person, place, and time. No cranial nerve deficit.  Psychiatric: She has a normal mood and affect. Her behavior is normal.    Results for orders placed in visit on 07/31/14  POCT RAPID STREP A (OFFICE)      Result Value Ref Range   Rapid Strep A Screen Negative  Negative    BP 102/68  Pulse 74  Temp(Src) 97.5 F (36.4 C) (Oral)  Resp 18  Ht 5\' 2"  (1.575 m)  Wt 135 lb (61.236 kg)  BMI 24.69 kg/m2  SpO2 99%  LMP 01/24/2014 Assessment & Plan:  Acute pharyngitis, unspecified pharyngitis type - Plan: POCT rapid strep A, Throat culture ([facility])  Acute upper respiratory infections of unspecified site  Meds ordered this encounter  Medications  . ipratropium (ATROVENT) 0.03 % nasal spray    Sig: Place 2 sprays into both nostrils every 12 (twelve) hours.  Dispense:  30 mL    Refill:  12   otc meds otherwise  I personally performed the services described in this documentation, which was scribed in my presence. The recorded information has been reviewed and is accurate.

## 2014-08-02 ENCOUNTER — Inpatient Hospital Stay (HOSPITAL_COMMUNITY)
Admission: AD | Admit: 2014-08-02 | Discharge: 2014-08-11 | DRG: 765 | Disposition: A | Discharge status: Home / Self Care | Payer: BC Managed Care – PPO | Source: Ambulatory Visit | Attending: Obstetrics and Gynecology | Admitting: Obstetrics and Gynecology

## 2014-08-02 ENCOUNTER — Encounter (HOSPITAL_COMMUNITY): Payer: Self-pay | Admitting: *Deleted

## 2014-08-02 DIAGNOSIS — Z8759 Personal history of other complications of pregnancy, childbirth and the puerperium: Secondary | ICD-10-CM | POA: Diagnosis present

## 2014-08-02 DIAGNOSIS — O358XX0 Maternal care for other (suspected) fetal abnormality and damage, not applicable or unspecified: Secondary | ICD-10-CM

## 2014-08-02 DIAGNOSIS — O163 Unspecified maternal hypertension, third trimester: Secondary | ICD-10-CM

## 2014-08-02 DIAGNOSIS — Z825 Family history of asthma and other chronic lower respiratory diseases: Secondary | ICD-10-CM

## 2014-08-02 DIAGNOSIS — O1492 Unspecified pre-eclampsia, second trimester: Secondary | ICD-10-CM

## 2014-08-02 DIAGNOSIS — O1414 Severe pre-eclampsia complicating childbirth: Principal | ICD-10-CM | POA: Diagnosis present

## 2014-08-02 DIAGNOSIS — O1490 Unspecified pre-eclampsia, unspecified trimester: Secondary | ICD-10-CM

## 2014-08-02 DIAGNOSIS — O98519 Other viral diseases complicating pregnancy, unspecified trimester: Secondary | ICD-10-CM | POA: Diagnosis present

## 2014-08-02 DIAGNOSIS — O4700 False labor before 37 completed weeks of gestation, unspecified trimester: Secondary | ICD-10-CM | POA: Diagnosis not present

## 2014-08-02 DIAGNOSIS — O321XX0 Maternal care for breech presentation, not applicable or unspecified: Secondary | ICD-10-CM | POA: Diagnosis present

## 2014-08-02 DIAGNOSIS — A6000 Herpesviral infection of urogenital system, unspecified: Secondary | ICD-10-CM | POA: Diagnosis present

## 2014-08-02 DIAGNOSIS — K219 Gastro-esophageal reflux disease without esophagitis: Secondary | ICD-10-CM | POA: Diagnosis present

## 2014-08-02 HISTORY — DX: Gastro-esophageal reflux disease without esophagitis: K21.9

## 2014-08-02 NOTE — MAU Note (Signed)
PT SAYS SHE STARTED FEELING UC AT 845PM-  DRANK WATER-    SHE DID NOT CALL DR.   LAST SEX-  8-22.

## 2014-08-03 ENCOUNTER — Encounter (HOSPITAL_COMMUNITY): Payer: Self-pay | Admitting: *Deleted

## 2014-08-03 ENCOUNTER — Encounter (HOSPITAL_COMMUNITY)
Admit: 2014-08-03 | Discharge: 2014-08-03 | Disposition: A | Discharge status: Home / Self Care | Payer: BC Managed Care – PPO | Attending: Advanced Practice Midwife | Admitting: Advanced Practice Midwife

## 2014-08-03 DIAGNOSIS — O98519 Other viral diseases complicating pregnancy, unspecified trimester: Secondary | ICD-10-CM | POA: Diagnosis present

## 2014-08-03 DIAGNOSIS — K219 Gastro-esophageal reflux disease without esophagitis: Secondary | ICD-10-CM | POA: Diagnosis present

## 2014-08-03 DIAGNOSIS — O321XX0 Maternal care for breech presentation, not applicable or unspecified: Secondary | ICD-10-CM | POA: Diagnosis present

## 2014-08-03 DIAGNOSIS — O1414 Severe pre-eclampsia complicating childbirth: Secondary | ICD-10-CM | POA: Diagnosis present

## 2014-08-03 DIAGNOSIS — Z825 Family history of asthma and other chronic lower respiratory diseases: Secondary | ICD-10-CM | POA: Diagnosis not present

## 2014-08-03 DIAGNOSIS — A6000 Herpesviral infection of urogenital system, unspecified: Secondary | ICD-10-CM | POA: Diagnosis present

## 2014-08-03 DIAGNOSIS — Z8759 Personal history of other complications of pregnancy, childbirth and the puerperium: Secondary | ICD-10-CM | POA: Diagnosis present

## 2014-08-03 DIAGNOSIS — O4700 False labor before 37 completed weeks of gestation, unspecified trimester: Secondary | ICD-10-CM | POA: Diagnosis present

## 2014-08-03 LAB — URIC ACID: URIC ACID, SERUM: 7.6 mg/dL — AB (ref 2.4–7.0)

## 2014-08-03 LAB — CBC
HCT: 31.4 % — ABNORMAL LOW (ref 36.0–46.0)
HCT: 32.3 % — ABNORMAL LOW (ref 36.0–46.0)
Hemoglobin: 11 g/dL — ABNORMAL LOW (ref 12.0–15.0)
Hemoglobin: 11.5 g/dL — ABNORMAL LOW (ref 12.0–15.0)
MCH: 31.6 pg (ref 26.0–34.0)
MCH: 31.9 pg (ref 26.0–34.0)
MCHC: 35 g/dL (ref 30.0–36.0)
MCHC: 35.6 g/dL (ref 30.0–36.0)
MCV: 89.7 fL (ref 78.0–100.0)
MCV: 90.2 fL (ref 78.0–100.0)
PLATELETS: 166 10*3/uL (ref 150–400)
PLATELETS: 168 10*3/uL (ref 150–400)
RBC: 3.48 MIL/uL — ABNORMAL LOW (ref 3.87–5.11)
RBC: 3.6 MIL/uL — AB (ref 3.87–5.11)
RDW: 13.2 % (ref 11.5–15.5)
RDW: 13.4 % (ref 11.5–15.5)
WBC: 8.2 10*3/uL (ref 4.0–10.5)
WBC: 8.6 10*3/uL (ref 4.0–10.5)

## 2014-08-03 LAB — LACTATE DEHYDROGENASE: LDH: 281 U/L — AB (ref 94–250)

## 2014-08-03 LAB — COMPREHENSIVE METABOLIC PANEL
ALBUMIN: 2.8 g/dL — AB (ref 3.5–5.2)
ALT: 66 U/L — ABNORMAL HIGH (ref 0–35)
ANION GAP: 12 (ref 5–15)
AST: 43 U/L — AB (ref 0–37)
Alkaline Phosphatase: 166 U/L — ABNORMAL HIGH (ref 39–117)
BILIRUBIN TOTAL: 0.2 mg/dL — AB (ref 0.3–1.2)
BUN: 13 mg/dL (ref 6–23)
CHLORIDE: 103 meq/L (ref 96–112)
CO2: 21 mEq/L (ref 19–32)
CREATININE: 0.58 mg/dL (ref 0.50–1.10)
Calcium: 8.5 mg/dL (ref 8.4–10.5)
GFR calc Af Amer: >90 mL/min (ref >90)
GFR calc non Af Amer: >90 mL/min (ref >90)
Glucose, Bld: 81 mg/dL (ref 70–99)
Potassium: 4.1 mEq/L (ref 3.7–5.3)
Sodium: 136 mEq/L — ABNORMAL LOW (ref 137–147)
TOTAL PROTEIN: 6.3 g/dL (ref 6.0–8.3)

## 2014-08-03 LAB — URINALYSIS, ROUTINE W REFLEX MICROSCOPIC
Bilirubin Urine: NEGATIVE
Glucose, UA: NEGATIVE mg/dL
Hgb urine dipstick: NEGATIVE
KETONES UR: NEGATIVE mg/dL
LEUKOCYTES UA: NEGATIVE
Nitrite: NEGATIVE
PROTEIN: NEGATIVE mg/dL
Specific Gravity, Urine: 1.01 (ref 1.005–1.030)
UROBILINOGEN UA: 0.2 mg/dL (ref 0.0–1.0)
pH: 7 (ref 5.0–8.0)

## 2014-08-03 LAB — PROTEIN / CREATININE RATIO, URINE
Creatinine, Urine: 36.12 mg/dL
Protein Creatinine Ratio: 0.19 — ABNORMAL HIGH (ref 0.00–0.15)
TOTAL PROTEIN, URINE: 6.9 mg/dL

## 2014-08-03 LAB — CULTURE, GROUP A STREP: Organism ID, Bacteria: NORMAL

## 2014-08-03 LAB — FETAL FIBRONECTIN: Fetal Fibronectin: NEGATIVE

## 2014-08-03 LAB — ABO/RH: ABO/RH(D): O POS

## 2014-08-03 MED ORDER — LABETALOL HCL 200 MG PO TABS
200.0000 mg | ORAL_TABLET | Freq: Two times a day (BID) | ORAL | Status: DC
  Administered 2014-08-03 – 2014-08-05 (×3): 200 mg via ORAL
  Filled 2014-08-03: qty 1
  Filled 2014-08-03 (×2): qty 2
  Filled 2014-08-03 (×2): qty 1
  Filled 2014-08-03: qty 2
  Filled 2014-08-03 (×4): qty 1

## 2014-08-03 MED ORDER — DOCUSATE SODIUM 100 MG PO CAPS
100.0000 mg | ORAL_CAPSULE | Freq: Every day | ORAL | Status: DC
  Administered 2014-08-03 – 2014-08-04 (×2): 100 mg via ORAL
  Filled 2014-08-03 (×2): qty 1

## 2014-08-03 MED ORDER — BETAMETHASONE SOD PHOS & ACET 6 (3-3) MG/ML IJ SUSP
12.0000 mg | INTRAMUSCULAR | Status: AC
  Administered 2014-08-03 – 2014-08-04 (×2): 12 mg via INTRAMUSCULAR
  Filled 2014-08-03 (×2): qty 2

## 2014-08-03 MED ORDER — ZOLPIDEM TARTRATE 5 MG PO TABS
5.0000 mg | ORAL_TABLET | Freq: Every evening | ORAL | Status: DC | PRN
  Administered 2014-08-04 (×2): 5 mg via ORAL
  Filled 2014-08-03 (×2): qty 1

## 2014-08-03 MED ORDER — CALCIUM CARBONATE ANTACID 500 MG PO CHEW: 2.0000 | CHEWABLE_TABLET | ORAL | Status: DC | PRN

## 2014-08-03 MED ORDER — FAMOTIDINE 20 MG PO TABS
20.0000 mg | ORAL_TABLET | Freq: Every day | ORAL | Status: DC
  Administered 2014-08-03 – 2014-08-05 (×3): 20 mg via ORAL
  Filled 2014-08-03 (×3): qty 1

## 2014-08-03 MED ORDER — ACETAMINOPHEN 325 MG PO TABS: 650.0000 mg | ORAL_TABLET | ORAL | Status: DC | PRN

## 2014-08-03 MED ORDER — PRENATAL MULTIVITAMIN CH
1.0000 | ORAL_TABLET | Freq: Every day | ORAL | Status: DC
  Filled 2014-08-03: qty 1

## 2014-08-03 MED ORDER — ACETAMINOPHEN 325 MG PO TABS
650.0000 mg | ORAL_TABLET | ORAL | Status: DC | PRN
Start: 2014-08-03 — End: 2014-08-05
  Administered 2014-08-03 – 2014-08-04 (×2): 650 mg via ORAL
  Filled 2014-08-03 (×2): qty 2

## 2014-08-03 MED ORDER — CALCIUM CARBONATE ANTACID 500 MG PO CHEW
2.0000 | CHEWABLE_TABLET | ORAL | Status: DC | PRN
  Filled 2014-08-03: qty 2

## 2014-08-03 MED ORDER — MAGNESIUM SULFATE BOLUS VIA INFUSION
4.0000 g | Freq: Once | INTRAVENOUS | Status: AC
  Administered 2014-08-03: 4 g via INTRAVENOUS
  Filled 2014-08-03: qty 500

## 2014-08-03 MED ORDER — PRENATAL MULTIVITAMIN CH
1.0000 | ORAL_TABLET | Freq: Every day | ORAL | Status: DC
  Administered 2014-08-03 – 2014-08-04 (×2): 1 via ORAL
  Filled 2014-08-03 (×2): qty 1

## 2014-08-03 MED ORDER — MAGNESIUM SULFATE 40 G IN LACTATED RINGERS - SIMPLE
2.0000 g/h | INTRAVENOUS | Status: DC
  Administered 2014-08-03 – 2014-08-04 (×3): 2 g/h via INTRAVENOUS
  Filled 2014-08-03 (×3): qty 500

## 2014-08-03 MED ORDER — VALACYCLOVIR HCL 500 MG PO TABS
500.0000 mg | ORAL_TABLET | Freq: Two times a day (BID) | ORAL | Status: DC
  Administered 2014-08-03 – 2014-08-04 (×3): 500 mg via ORAL
  Filled 2014-08-03 (×3): qty 1

## 2014-08-03 MED ORDER — ZOLPIDEM TARTRATE 5 MG PO TABS
5.0000 mg | ORAL_TABLET | Freq: Every evening | ORAL | Status: DC | PRN
  Administered 2014-08-03: 5 mg via ORAL
  Filled 2014-08-03: qty 1

## 2014-08-03 MED ORDER — LACTATED RINGERS IV SOLN
INTRAVENOUS | Status: DC
  Administered 2014-08-03 – 2014-08-04 (×4): via INTRAVENOUS

## 2014-08-03 MED ORDER — DOCUSATE SODIUM 100 MG PO CAPS
100.0000 mg | ORAL_CAPSULE | Freq: Every day | ORAL | Status: DC
Start: 2014-08-03 — End: 2014-08-03
  Filled 2014-08-03: qty 1

## 2014-08-03 MED ORDER — LABETALOL HCL 100 MG PO TABS: 200.0000 mg | ORAL_TABLET | Freq: Two times a day (BID) | ORAL | Status: DC

## 2014-08-03 NOTE — H&P (Addendum)
28 y.o. [redacted]w[redacted]d  [id number] comes in c/o contractions.  Otherwise has good fetal movement and no bleeding.  Past Medical History  Diagnosis Date  . HSV (herpes simplex virus) infection   . GERD (gastroesophageal reflux disease)     Past Surgical History  Procedure Laterality Date  . Wisdom tooth extraction    . Hernia repair      OB History  Gravida Para Term Preterm AB SAB TAB Ectopic Multiple Living  4 3 2 1  0 0 0 0 0 3    # Outcome Date GA Lbr Len/2nd Weight Sex Delivery Anes PTL Lv  4 CUR           3 TRM 12/15/09 [redacted]w[redacted]d  3.345 kg (7 lb 6 oz)  SVD   Y  2 TRM 03/01/08 [redacted]w[redacted]d  3.204 kg (7 lb 1 oz)  SVD   Y  1 PRE 12/13/02 [redacted]w[redacted]d  2.75 kg (6 lb 1 oz)  SVD        Comments: IOL for Pre-eclampsia      History   Social History  . Marital Status: Married    Spouse Name: N/A    Number of Children: N/A  . Years of Education: N/A   Occupational History  . Not Employed    Social History Main Topics  . Smoking status: Never Smoker   . Smokeless tobacco: Not on file  . Alcohol Use: No  . Drug Use: No  . Sexual Activity: Yes    Partners: Male   Other Topics Concern  . Not on file   Social History Narrative   Married. Education: [employer]. Pt does not exercise.   Review of patient's allergies indicates no known allergies.    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 L 4.9 mm/R 8.63mm. Seeing MFM, peds urology Fetal Ultrasounds or other Referrals: Referral to MFM Maternal Substance Abuse:  No Significant Maternal Medications:  None Significant Maternal Lab Results: None  Other PNC: uncomplicated.    Filed Vitals:   08/03/14 0120  BP: 160/92  Pulse: 48  Temp: 98.1 F (36.7 C)  Resp: 16     Lungs/Cor:  NAD Abdomen:  soft, gravid Ex:  no cords,  erythema SVE:  0/thick/-3 FHTs:  140, good STV, NST R Toco:  2-4 per MAU   A/P   Admit for surveillance of elevated blood pressure and elevated liver enzymes- suspected preeclampsia with severe features  Monitor blood pressures, begin labetalol 200 bid  Betamethasone x 2 for FLM  Magnesium sulfate  MFM consult  GBS not yet done  No HSV lesions noted on exam  FFN negative  Continuous monitoring and other routien antenatal care  Repeat labs in am  [place], [facility]

## 2014-08-03 NOTE — Consult Note (Signed)
Asked by Dr.Callahan to provide prenatal consultation for patient at risk for preterm delivery due to gestational hypertension.  Mother is 28 y.o. G4 P3 who is now 27 2/[redacted] weeks EGA who was admitted last night with elevated BP, found to have elevated liver functions.  She is being treated with betamethasone and MgSO4 as well as labetalol.  Discussed with patient and her husband the usual expectations for preterm infant at 7 - [redacted] weeks gestation, including possible needs for DR resuscitation, respiratory support, IV access, and blood products.  Presented optimistic long-term prognosis but with increased risks of death or serious morbidity, including neurodevelopmental delay and lung disease.  Projected possible length of stay in NICU until 36 - [redacted] wks EGA.  Discussed advantages of feeding with mother's milk.  She plans to pump postnatally.  Patient and father of baby were attentive, had appropriate questions, and were appreciative of my input.  Mother mentioned there had been a prenatal US showing possible kidney abnormality.  She had met with peds GU at [facility] who suggested postnatal [place] and possible need for prophylactic antibiotics.  Thank you for the consultation.  Total time 25 minutes, all face-t-face

## 2014-08-03 NOTE — MAU Provider Note (Signed)
History     CSN: [id number]  Arrival date and time: 08/02/14 2251   First Provider Initiated Contact with Patient 08/02/14 2356      No chief complaint on file.  HPI Evelyn [name] is a 28 y.o. [phone] at 109w2d who presents today with uterine contractions. She states that she started to notice contractions around 2045, and she felt like she had about 10 in that hour. She denies any VB or LOF and confirms fetal movement. She reports a history of pre-eclampsia at the end of two of her pregnancies. She denies any HA today. However, she did have blurry vision this morning. She changed her glasses, but that did not help.  Past Medical History  Diagnosis Date  . HSV (herpes simplex virus) infection   . GERD (gastroesophageal reflux disease)     Past Surgical History  Procedure Laterality Date  . Wisdom tooth extraction    . Hernia repair      History reviewed. No pertinent family history.  History  Substance Use Topics  . Smoking status: Never Smoker   . Smokeless tobacco: Not on file  . Alcohol Use: No    Allergies: No Known Allergies  Prescriptions prior to admission  Medication Sig Dispense Refill  . ipratropium (ATROVENT) 0.03 % nasal spray Place 2 sprays into both nostrils every 12 (twelve) hours.  30 mL  12  . Prenatal Vit w/Fe-Methylfol-FA (PNV PO) Take by mouth.      . valACYclovir (VALTREX) 500 MG tablet Take 500 mg by mouth 2 (two) times daily.        ROS Physical Exam   Blood pressure 167/99, pulse 54, temperature 98.7 F (37.1 C), temperature source Oral, resp. rate 18, height 5\' 1"  (1.549 m), weight 61.859 kg (136 lb 6 oz), last menstrual period 01/24/2014.  Physical Exam  Nursing note and vitals reviewed. Constitutional: She is oriented to person, place, and time. She appears well-developed and well-nourished. No distress.  Cardiovascular: Normal rate.   Respiratory: Effort normal.  GI: Soft. There is no tenderness. There is no rebound.  Genitourinary:    Cervix: closed/thick/-3  Musculoskeletal: She exhibits no edema.  Neurological: She is alert and oriented to person, place, and time. She has normal reflexes.  Skin: Skin is warm and dry.  Psychiatric: She has a normal mood and affect.    MAU Course  Procedures Results for orders placed during the hospital encounter of 08/02/14 (from the past 24 hour(s))  PROTEIN / CREATININE RATIO, URINE     Status: Abnormal   Collection Time    08/02/14 11:00 PM      Result Value Ref Range   Creatinine, Urine 36.12     Total Protein, Urine 6.9     PROTEIN CREATININE RATIO 0.19 (*) 0.00 - 0.15  URINALYSIS, ROUTINE W REFLEX MICROSCOPIC     Status: None   Collection Time    08/02/14 11:06 PM      Result Value Ref Range   Color, Urine YELLOW  YELLOW   APPearance CLEAR  CLEAR   Specific Gravity, Urine 1.010  1.005 - 1.030   pH 7.0  5.0 - 8.0   Glucose, UA NEGATIVE  NEGATIVE mg/dL   Hgb urine dipstick NEGATIVE  NEGATIVE   Bilirubin Urine NEGATIVE  NEGATIVE   Ketones, ur NEGATIVE  NEGATIVE mg/dL   Protein, ur NEGATIVE  NEGATIVE mg/dL   Urobilinogen, UA 0.2  0.0 - 1.0 mg/dL   Nitrite NEGATIVE  NEGATIVE   Leukocytes,  UA NEGATIVE  NEGATIVE  CBC     Status: Abnormal   Collection Time    08/02/14 11:46 PM      Result Value Ref Range   WBC 8.6  4.0 - 10.5 K/uL   RBC 3.48 (*) 3.87 - 5.11 MIL/uL   Hemoglobin 11.0 (*) 12.0 - 15.0 g/dL   HCT 31.4 (*) 36.0 - 46.0 %   MCV 90.2  78.0 - 100.0 fL   MCH 31.6  26.0 - 34.0 pg   MCHC 35.0  30.0 - 36.0 g/dL   RDW 13.4  11.5 - 15.5 %   Platelets 168  150 - 400 K/uL  COMPREHENSIVE METABOLIC PANEL     Status: Abnormal   Collection Time    08/02/14 11:46 PM      Result Value Ref Range   Sodium 136 (*) 137 - 147 mEq/L   Potassium 4.1  3.7 - 5.3 mEq/L   Chloride 103  96 - 112 mEq/L   CO2 21  19 - 32 mEq/L   Glucose, Bld 81  70 - 99 mg/dL   BUN 13  6 - 23 mg/dL   Creatinine, Ser 0.58  0.50 - 1.10 mg/dL   Calcium 8.5  8.4 - 10.5 mg/dL   Total Protein 6.3   6.0 - 8.3 g/dL   Albumin 2.8 (*) 3.5 - 5.2 g/dL   AST 43 (*) 0 - 37 U/L   ALT 66 (*) 0 - 35 U/L   Alkaline Phosphatase 166 (*) 39 - 117 U/L   Total Bilirubin 0.2 (*) 0.3 - 1.2 mg/dL   GFR calc non Af Amer >90  >90 mL/min   GFR calc Af Amer >90  >90 mL/min   Anion gap 12  5 - 15  0045: D/W Dr. Rogue Bussing will place on ante for obs.   Assessment and Plan   1. Hypertension in pregnancy, antepartum, third trimester    Place on ante for OBS   Mathis Bud 08/03/2014, 12:02 AM

## 2014-08-03 NOTE — Consult Note (Signed)
Maternal Fetal Medicine Consultation  Requesting Provider(s): Allyn Kenner, MD  Reason for consultation: Preeclampsia with severe features  HPI: Evelyn [name] is a 28 year old G4P2103, EDD 10/31/2014, currently at 27w 2d seen for consultation due to new diagnosis of preeclampsia with severe features.  Evelyn [name] came into MAU last night due to preterm contractions.  On evaluation, she was noted to have elevated blood pressures in the 160's/ 90's range and some mildly elevated LFTs.  In retrospect, the patient reports that she has had worsening GI reflux over the last several days that did not improve with TUMS.  She is otherwise asymptomatic  - denies RUQ pain, Head ache or visual changes.   Evelyn [name]'s past OB history is remarkable for a previous induction of labor at 36 weeks due to preeclampsia and a 39 week delivery also complicated by preeclampsia.  Prenatal ultrasound this pregnancy revealed right fetal hydronephrosis with right hydroureter and likely ureterocele.  She was recently seen by [facility] urology with plans for follow up with him after delivery.  Most recent ultrasound on 8/19 - EFW at 31st %tile.  Patient started on Labetalol 200 mg BID at the time of admission.  BPs somewhat improved on this regimen.  Patient is currently undergoing a 24-hr urine protein collection.  OB History: OB History   Grav Para Term Preterm Abortions TAB SAB Ect Mult Living   _0 0 0 0 0 0 3    G1 - 36 week IOL due to preeclampsia G2- 39 week IOL due to preeclampsia G3- uncomplicated  PMH:  Past Medical History  Diagnosis Date  . HSV (herpes simplex virus) infection   . GERD (gastroesophageal reflux disease)     PSH:  Past Surgical History  Procedure Laterality Date  . Wisdom tooth extraction    . Hernia repair     Meds:  Scheduled Meds: . betamethasone acetate-betamethasone sodium phosphate  12 mg Intramuscular Q24H  . docusate sodium  100 mg Oral Daily  . labetalol  200 mg Oral  BID  . prenatal multivitamin  1 tablet Oral Q1200   Continuous Infusions: . lactated ringers 100 mL/hr at 08/03/14 0520  . magnesium sulfate 2 g/hr (08/03/14 0548)   PRN Meds:.acetaminophen, calcium carbonate, zolpidem  Allergies: No Known Allergies  FH:  Family History  Problem Relation Age of Onset  . Asthma Son   Denies any known family history of birth defects or hereditary disorders  Soc:  History   Social History  . Marital Status: Married    Spouse Name: N/A    Number of Children: N/A  . Years of Education: N/A   Occupational History  . Not Employed    Social History Main Topics  . Smoking status: Never Smoker   . Smokeless tobacco: Not on file  . Alcohol Use: No  . Drug Use: No  . Sexual Activity: Yes    Partners: Male   Other Topics Concern  . Not on file   Social History Narrative   Married. Education: [employer]. Pt does not exercise.    Review of Systems: no vaginal bleeding or cramping/contractions, no LOF, no nausea/vomiting. All other systems reviewed and are negative.  PE: Filed Vitals:   08/03/14 0821  BP: 140/89  Pulse: 73  Temp: 97.9 F (36.6 C)  Resp: 18  Abd - soft, NT Ext - 1+ edema  GEN: well-appearing female ABD: gravid, NT    Labs:  Recent Results (from the past  2160 hour(s))  POCT RAPID STREP A (OFFICE)     Status: None   Collection Time    07/31/14  4:03 PM      Result Value Ref Range   Rapid Strep A Screen Negative  Negative  PROTEIN / CREATININE RATIO, URINE     Status: Abnormal   Collection Time    08/02/14 11:00 PM      Result Value Ref Range   Creatinine, Urine 36.12     Total Protein, Urine 6.9     Comment: NO NORMAL RANGE ESTABLISHED FOR THIS TEST   PROTEIN CREATININE RATIO 0.19 (*) 0.00 - 0.15  URINALYSIS, ROUTINE W REFLEX MICROSCOPIC     Status: None   Collection Time    08/02/14 11:06 PM      Result Value Ref Range   Color, Urine YELLOW  YELLOW   APPearance CLEAR  CLEAR   Specific Gravity, Urine  1.010  1.005 - 1.030   pH 7.0  5.0 - 8.0   Glucose, UA NEGATIVE  NEGATIVE mg/dL   Hgb urine dipstick NEGATIVE  NEGATIVE   Bilirubin Urine NEGATIVE  NEGATIVE   Ketones, ur NEGATIVE  NEGATIVE mg/dL   Protein, ur NEGATIVE  NEGATIVE mg/dL   Urobilinogen, UA 0.2  0.0 - 1.0 mg/dL   Nitrite NEGATIVE  NEGATIVE   Leukocytes, UA NEGATIVE  NEGATIVE   Comment: MICROSCOPIC NOT DONE ON URINES WITH NEGATIVE PROTEIN, BLOOD, LEUKOCYTES, NITRITE, OR GLUCOSE <1000 mg/dL.  CBC     Status: Abnormal   Collection Time    08/02/14 11:46 PM      Result Value Ref Range   WBC 8.6  4.0 - 10.5 K/uL   RBC 3.48 (*) 3.87 - 5.11 MIL/uL   Hemoglobin 11.0 (*) 12.0 - 15.0 g/dL   HCT 31.4 (*) 36.0 - 46.0 %   MCV 90.2  78.0 - 100.0 fL   MCH 31.6  26.0 - 34.0 pg   MCHC 35.0  30.0 - 36.0 g/dL   RDW 13.4  11.5 - 15.5 %   Platelets 168  150 - 400 K/uL  COMPREHENSIVE METABOLIC PANEL     Status: Abnormal   Collection Time    08/02/14 11:46 PM      Result Value Ref Range   Sodium 136 (*) 137 - 147 mEq/L   Potassium 4.1  3.7 - 5.3 mEq/L   Chloride 103  96 - 112 mEq/L   CO2 21  19 - 32 mEq/L   Glucose, Bld 81  70 - 99 mg/dL   BUN 13  6 - 23 mg/dL   Creatinine, Ser 0.58  0.50 - 1.10 mg/dL   Calcium 8.5  8.4 - 10.5 mg/dL   Total Protein 6.3  6.0 - 8.3 g/dL   Albumin 2.8 (*) 3.5 - 5.2 g/dL   AST 43 (*) 0 - 37 U/L   ALT 66 (*) 0 - 35 U/L   Alkaline Phosphatase 166 (*) 39 - 117 U/L   Total Bilirubin 0.2 (*) 0.3 - 1.2 mg/dL   GFR calc non Af Amer >90  >90 mL/min   GFR calc Af Amer >90  >90 mL/min   Comment: (NOTE)     The eGFR has been calculated using the CKD EPI equation.     This calculation has not been validated in all clinical situations.     eGFR's persistently <90 mL/min signify possible Chronic Kidney     Disease.   Anion gap 12  5 - 15  FETAL FIBRONECTIN     Status: None   Collection Time    08/03/14 12:01 AM      Result Value Ref Range   Fetal Fibronectin NEGATIVE  NEGATIVE  TYPE AND SCREEN     Status:  None   Collection Time    08/03/14  5:10 AM      Result Value Ref Range   ABO/RH(D) O POS     Antibody Screen NEG     Sample Expiration 08/06/2014    URIC ACID     Status: Abnormal   Collection Time    08/03/14  5:10 AM      Result Value Ref Range   Uric Acid, Serum 7.6 (*) 2.4 - 7.0 mg/dL  LACTATE DEHYDROGENASE     Status: Abnormal   Collection Time    08/03/14  5:10 AM      Result Value Ref Range   LDH 281 (*) 94 - 250 U/L  CBC     Status: Abnormal   Collection Time    08/03/14  5:10 AM      Result Value Ref Range   WBC 8.2  4.0 - 10.5 K/uL   RBC 3.60 (*) 3.87 - 5.11 MIL/uL   Hemoglobin 11.5 (*) 12.0 - 15.0 g/dL   HCT 32.3 (*) 36.0 - 46.0 %   MCV 89.7  78.0 - 100.0 fL   MCH 31.9  26.0 - 34.0 pg   MCHC 35.6  30.0 - 36.0 g/dL   RDW 13.2  11.5 - 15.5 %   Platelets 166  150 - 400 K/uL  ABO/RH     Status: None   Collection Time    08/03/14  5:10 AM      Result Value Ref Range   ABO/RH(D) O POS      A/P: 1) Single IUP at 27w 2d         2) Preeclampsia with severe features - based on severe range blood pressures and elevated LFTs.  Concur with betamethasone and Magnesium sulfate neuroprophylaxis for now.  If the patient otherwise remains stable,would discontinue Magnesium sulfate once steroids are complete.  For now, would recommend daily preeclampsia labs - if stable over the next several days, may decrease frequency to 2-3x weekly.  Recommend NICU consultation - feel that the likelihood of delivery over the next 1-2 weeks is very high.  Recommend follow up ultrasound for growth and reevaluation of the fetal kidneys with MFM next week (if undelivered).  Would move toward delivery for: 1) Continued severe range blood pressures (SBP >160 or DBP > 110 despite oral antihypertensive medications) 2) LFTs > 2x normal or plts < 100K 3) Severe headache unresponsive to medications 4) Creat > 1.5 mg/dl 5) Non-reassuring fetal testing.  Thank you for the opportunity to be a part of the care  of [place]. Please contact our office if we can be of further assistance.   I spent approximately 30 minutes with this patient with over 50% of time spent in face-to-face counseling.  Benjaman Lobe, MD Maternal Fetal Medicine

## 2014-08-03 NOTE — Progress Notes (Signed)
Neo Consult called to Dr. Barbaraann Rondo

## 2014-08-04 LAB — COMPREHENSIVE METABOLIC PANEL
ALBUMIN: 2.6 g/dL — AB (ref 3.5–5.2)
ALK PHOS: 174 U/L — AB (ref 39–117)
ALT: 74 U/L — AB (ref 0–35)
ALT: 72 U/L — AB (ref 0–35)
AST: 57 U/L — ABNORMAL HIGH (ref 0–37)
AST: 63 U/L — AB (ref 0–37)
Albumin: 2.8 g/dL — ABNORMAL LOW (ref 3.5–5.2)
Alkaline Phosphatase: 145 U/L — ABNORMAL HIGH (ref 39–117)
Anion gap: 13 (ref 5–15)
Anion gap: 11 (ref 5–15)
BILIRUBIN TOTAL: 0.2 mg/dL — AB (ref 0.3–1.2)
BUN: 9 mg/dL (ref 6–23)
BUN: 10 mg/dL (ref 6–23)
CHLORIDE: 101 meq/L (ref 96–112)
CHLORIDE: 103 meq/L (ref 96–112)
CO2: 22 meq/L (ref 19–32)
CO2: 21 mEq/L (ref 19–32)
Calcium: 7.9 mg/dL — ABNORMAL LOW (ref 8.4–10.5)
Calcium: 7.5 mg/dL — ABNORMAL LOW (ref 8.4–10.5)
Creatinine, Ser: 0.53 mg/dL (ref 0.50–1.10)
Creatinine, Ser: 0.53 mg/dL (ref 0.50–1.10)
GFR calc Af Amer: >90 mL/min (ref >90)
GFR calc non Af Amer: >90 mL/min (ref >90)
GLUCOSE: 120 mg/dL — AB (ref 70–99)
Glucose, Bld: 103 mg/dL — ABNORMAL HIGH (ref 70–99)
POTASSIUM: 4.1 meq/L (ref 3.7–5.3)
Potassium: 4.1 mEq/L (ref 3.7–5.3)
SODIUM: 135 meq/L — AB (ref 137–147)
Sodium: 136 mEq/L — ABNORMAL LOW (ref 137–147)
Total Bilirubin: 0.2 mg/dL — ABNORMAL LOW (ref 0.3–1.2)
Total Protein: 6.2 g/dL (ref 6.0–8.3)
Total Protein: 6.1 g/dL (ref 6.0–8.3)

## 2014-08-04 LAB — PROTEIN / CREATININE RATIO, URINE
Creatinine, Urine: 36.63 mg/dL
PROTEIN CREATININE RATIO: 0.11 (ref 0.00–0.15)
Total Protein, Urine: 4 mg/dL

## 2014-08-04 LAB — CREATININE CLEARANCE, URINE, 24 HOUR
COLLECTION INTERVAL-CRCL: 24 h
CREATININE 24H UR: 998 mg/d (ref 700–1800)
CREATININE, URINE: 23.07 mg/dL
CREATININE: 0.53 mg/dL (ref 0.50–1.10)
Creatinine Clearance: 131 mL/min — ABNORMAL HIGH (ref 75–115)
URINE TOTAL VOLUME-CRCL: 4325 mL

## 2014-08-04 LAB — PROTEIN, URINE, 24 HOUR
Collection Interval-UPROT: 24 hours
PROTEIN 24H UR: 173 mg/d — AB (ref <150)
Protein, Urine: 4 mg/dL — ABNORMAL LOW (ref 5–24)
Urine Total Volume-UPROT: 4325 mL

## 2014-08-04 MED ORDER — OXYCODONE-ACETAMINOPHEN 5-325 MG PO TABS
1.0000 | ORAL_TABLET | Freq: Four times a day (QID) | ORAL | Status: DC | PRN
  Administered 2014-08-05: 2 via ORAL
  Filled 2014-08-04: qty 2

## 2014-08-04 MED ORDER — SODIUM CHLORIDE 0.9 % IJ SOLN
3.0000 mL | Freq: Two times a day (BID) | INTRAMUSCULAR | Status: DC
  Administered 2014-08-04 – 2014-08-05 (×3): 3 mL via INTRAVENOUS

## 2014-08-04 MED ORDER — OXYCODONE-ACETAMINOPHEN 5-325 MG PO TABS
2.0000 | ORAL_TABLET | Freq: Once | ORAL | Status: AC
  Administered 2014-08-04: 2 via ORAL
  Filled 2014-08-04: qty 2

## 2014-08-04 NOTE — Progress Notes (Signed)
Breech on bedside US

## 2014-08-04 NOTE — Progress Notes (Signed)
Patient ID: Evelyn [name], female   DOB: Apr 22, 1986, 28 y.o.   MRN: [id number]   Hospital day #8: 28 year old gravida 4 para 2103 at 27+3 days Admitted for preeclampsia with possible severe features  S: overnight, the patient has been complaining of headache. She did receive Tylenol x1 and states that her headache is now 10 out of 10 and she has experienced no relief from her. She does feel hot and flushed, but denies significant vision changes, epigastric or right upper quadrant pain. O:  Filed Vitals:   08/04/14 0920 08/04/14 1000 08/04/14 1100 08/04/14 1135  BP:  125/76 107/67   Pulse: 76 77 79 81  Temp:      TempSrc:      Resp:  18 18   Height:      Weight:      SpO2: 98% 98% 97% 98%   Gen: AOX3 CTAB RRR Abdomen soft, gravid, NT/ND DTRs 1-2+, Bilateral upper & lower extremities No clonus  TAUS Performed at bedside by me: Pilar Plate breech presentation, EFW 937 g, 2 lbs. 1 oz., AFI 12.2 cm.  Lab Results  Component Value Date   PROTEIN24HR 173* 08/04/2014   Results for orders placed during the hospital encounter of 08/02/14 (from the past 24 hour(s))  COMPREHENSIVE METABOLIC PANEL     Status: Abnormal   Collection Time    08/03/14 11:28 PM      Result Value Ref Range   Sodium 136 (*) 137 - 147 mEq/L   Potassium 4.1  3.7 - 5.3 mEq/L   Chloride 101  96 - 112 mEq/L   CO2 22  19 - 32 mEq/L   Glucose, Bld 120 (*) 70 - 99 mg/dL   BUN 9  6 - 23 mg/dL   Creatinine, Ser 0.53  0.50 - 1.10 mg/dL   Calcium 7.9 (*) 8.4 - 10.5 mg/dL   Total Protein 6.2  6.0 - 8.3 g/dL   Albumin 2.8 (*) 3.5 - 5.2 g/dL   AST 57 (*) 0 - 37 U/L   ALT 74 (*) 0 - 35 U/L   Alkaline Phosphatase 174 (*) 39 - 117 U/L   Total Bilirubin 0.2 (*) 0.3 - 1.2 mg/dL   GFR calc non Af Amer >90  >90 mL/min   GFR calc Af Amer >90  >90 mL/min   Anion gap 13  5 - 15  PROTEIN, URINE, 24 HOUR     Status: Abnormal   Collection Time    08/04/14  2:30 AM      Result Value Ref Range   Urine Total Volume-UPROT 4325     Collection Interval-UPROT 24     Protein, Urine 4 (*) 5 - 24 mg/dL   Protein, 24H Urine 173 (*) <150 mg/day  CREATININE CLEARANCE, URINE, 24 HOUR     Status: None   Collection Time    08/04/14  2:30 AM      Result Value Ref Range   Urine Total Volume-CRCL 4325     Collection Interval-CRCL 24     Creatinine, Urine 23.07     Creatinine PENDING  0.50 - 1.10 mg/dL   Creatinine, 24H Ur 998  700 - 1800 mg/day   Creatinine Clearance PENDING  75 - 115 mL/min  COMPREHENSIVE METABOLIC PANEL     Status: Abnormal   Collection Time    08/04/14  5:46 AM      Result Value Ref Range   Sodium 135 (*) 137 - 147 mEq/L   Potassium 4.1  3.7 - 5.3 mEq/L   Chloride 103  96 - 112 mEq/L   CO2 21  19 - 32 mEq/L   Glucose, Bld 103 (*) 70 - 99 mg/dL   BUN 10  6 - 23 mg/dL   Creatinine, Ser 0.53  0.50 - 1.10 mg/dL   Calcium 7.5 (*) 8.4 - 10.5 mg/dL   Total Protein 6.1  6.0 - 8.3 g/dL   Albumin 2.6 (*) 3.5 - 5.2 g/dL   AST 63 (*) 0 - 37 U/L   ALT 72 (*) 0 - 35 U/L   Alkaline Phosphatase 145 (*) 39 - 117 U/L   Total Bilirubin 0.2 (*) 0.3 - 1.2 mg/dL   GFR calc non Af Amer >90  >90 mL/min   GFR calc Af Amer >90  >90 mL/min   Anion gap 11  5 - 15  PROTEIN / CREATININE RATIO, URINE     Status: None   Collection Time    08/04/14  6:45 AM      Result Value Ref Range   Creatinine, Urine 36.63     Total Protein, Urine 4     PROTEIN CREATININE RATIO 0.11  0.00 - 0.15    A/P: 1) Preeclampsia with severe range features. At this time the patient's blood pressure elevations have completely resolved. She is now normotensive, however she is on magnesium sulfate 2 g per hour and she has been started on labetalol 200 mg twice a day. All of the patient's laboratory values are within normal limits except her liver functions. Her liver functions are trending up however they have not doubled. Her urine 24-hour protein is actually improved from the a protein creatinine ratio on admission. Admission protein creatinine ratio  was 0.19, 24-hour urine protein was 173 mg. The patient does complain of a headache, however she is on magnesium and it is difficult to determine whether her symptoms are due to the side effects of magnesium or truly related to severe features of preeclampsia. I had a frank discussion with the patient reporting the balance of risks and benefits to her health and her fetus is health. I also discussed the patient at length with Dr. Burnett Harry of maternal-fetal medicine. She concurs with the plan to discontinue the patient's magnesium, administer Percocet for her headache, saline lock her IV, allow her to eat and shower, and discontinue continuous monitoring at this time. As the day progresses we will see how she feels. If she truly is demonstrating a severe range features then we may need to proceed with delivery. 2) mode of delivery: Unfortunately, the fetus is breech on ultrasound this morning. If the decision is made to proceed with delivery we will recheck presentation. As it stands if she remains breech she will require cesarean.

## 2014-08-04 NOTE — Progress Notes (Signed)
   Since discontinuation of the magnesium and by mouth Percocet x1 the patient's headache has completely resolved. Per her nurse she is feeling much better and much more comfortable. She does not complain of any further assistance to your preeclampsia features.  Will plan to recheck her lab work in the morning.  Fetal monitoring has been reassuring. Patient is now having nonstress tests performed every shift. Will respect to sleep.  Filed Vitals:   08/04/14 1135 08/04/14 1700 08/04/14 2028 08/04/14 2159  BP:  126/71 136/78 135/71  Pulse: 81 64 64 62  Temp:  98.1 F (36.7 C) 98.3 F (36.8 C)   TempSrc:  Oral Oral   Resp:  18 18   Height:      Weight:      SpO2: 98%       Blood pressures have essentially been in the normotensive range since discontinuation of the magnesium. The patient continues to be on labetalol 200 mg twice a day.

## 2014-08-04 NOTE — Progress Notes (Signed)
Patient ID: Doristine Section, female   DOB: Sep 08, 1986, 28 y.o.   MRN: [id number] Name: MILANA SALAY Medical Record Number:  [id number] Date of Birth: 12/26/1985 Date of Service: 08/04/2014  28 y.o. [id number] [redacted]w[redacted]d HD#2 admitted for 27 weeks 3 days with pre-eclampsia with severe features, specifically Elevated liver function. Patient reports that throughout the day she started to feel worse.  Earlier she notes  She had a occipital headache, now resolved. However she has "neck tension'. She also states that She felt her vision was "foggy" earlier, but now is clear.  This was not reported to any of the providers / nursing staff during the day.   Presently, she denies headache, no blurry vision no RUQ pain.  She denies contractions, no vaginal bleeding, no leaking of fluid. Reports good FM.  The patient's past medical history and prenatal records were reviewed.  Additional issues addressed and updated today: Patient Active Problem List   Diagnosis Date Noted  . Hypertension in pregnancy 08/03/2014  . Preeclampsia 08/03/2014  . Chronic back pain 01/14/2014  . Allergic rhinitis 01/14/2014   Family History  Problem Relation Age of Onset  . Asthma Son    History   Social History  . Marital Status: Married    Spouse Name: N/A    Number of Children: N/A  . Years of Education: N/A   Occupational History  . Not Employed    Social History Main Topics  . Smoking status: Never Smoker   . Smokeless tobacco: None  . Alcohol Use: No  . Drug Use: No  . Sexual Activity: Yes    Partners: Male   Other Topics Concern  . None   Social History Narrative   Married. Education: [employer]. Pt does not exercise.   Filed Vitals:   08/04/14 0145  BP: 134/80  Pulse: 61  Temp: 98.1 F (36.7 C)  Resp: 18     Physical Examination:   Filed Vitals:   08/04/14 0145  BP: 134/80  Pulse: 61  Temp: 98.1 F (36.7 C)  Resp: 18   General appearance - alert, awake and oriented  and in no  distress Lungs CTA Cor RRR Abd  Soft, gravid, nontender Ex SCDs ; DTRs: 2+ bilaterally no clonus  FHTs  140s, moderate variability reactive for gestational age 28  No ctx  Cervix: not evaluated  Results for orders placed during the hospital encounter of 08/02/14 (from the past 24 hour(s))  TYPE AND SCREEN     Status: None   Collection Time    08/03/14  5:10 AM      Result Value Ref Range   ABO/RH(D) O POS     Antibody Screen NEG     Sample Expiration 08/06/2014    URIC ACID     Status: Abnormal   Collection Time    08/03/14  5:10 AM      Result Value Ref Range   Uric Acid, Serum 7.6 (*) 2.4 - 7.0 mg/dL  LACTATE DEHYDROGENASE     Status: Abnormal   Collection Time    08/03/14  5:10 AM      Result Value Ref Range   LDH 281 (*) 94 - 250 U/L  CBC     Status: Abnormal   Collection Time    08/03/14  5:10 AM      Result Value Ref Range   WBC 8.2  4.0 - 10.5 K/uL   RBC 3.60 (*) 3.87 - 5.11 MIL/uL   Hemoglobin 11.5 (*)  12.0 - 15.0 g/dL   HCT 32.3 (*) 36.0 - 46.0 %   MCV 89.7  78.0 - 100.0 fL   MCH 31.9  26.0 - 34.0 pg   MCHC 35.6  30.0 - 36.0 g/dL   RDW 13.2  11.5 - 15.5 %   Platelets 166  150 - 400 K/uL  ABO/RH     Status: None   Collection Time    08/03/14  5:10 AM      Result Value Ref Range   ABO/RH(D) O POS    CULTURE, BETA STREP (GROUP B ONLY)     Status: None   Collection Time    08/03/14  8:55 AM      Result Value Ref Range   Specimen Description VAGINAL/RECTAL     Special Requests NONE     Culture       Value: Culture reincubated for better growth     Performed at [facility]   Report Status PENDING    COMPREHENSIVE METABOLIC PANEL     Status: Abnormal   Collection Time    08/03/14 11:28 PM      Result Value Ref Range   Sodium 136 (*) 137 - 147 mEq/L   Potassium 4.1  3.7 - 5.3 mEq/L   Chloride 101  96 - 112 mEq/L   CO2 22  19 - 32 mEq/L   Glucose, Bld 120 (*) 70 - 99 mg/dL   BUN 9  6 - 23 mg/dL   Creatinine, Ser 0.53  0.50 - 1.10 mg/dL    Calcium 7.9 (*) 8.4 - 10.5 mg/dL   Total Protein 6.2  6.0 - 8.3 g/dL   Albumin 2.8 (*) 3.5 - 5.2 g/dL   AST 57 (*) 0 - 37 U/L   ALT 74 (*) 0 - 35 U/L   Alkaline Phosphatase 174 (*) 39 - 117 U/L   Total Bilirubin 0.2 (*) 0.3 - 1.2 mg/dL   GFR calc non Af Amer >90  >90 mL/min   GFR calc Af Amer >90  >90 mL/min   Anion gap 13  5 - 15    A/P:  HD#2  [redacted]w[redacted]d with severe preeclampsia. Clinically it is unclear if patient is starting to have neurologic symptomology.  We discussed that if she has headache again, she should let the provider know if she has any visual changes.  We discussed that if delivery is imminent, despite her previous two vaginal deliveries, I would probably recommend a cesarean delivery to expedite The process. CMP from afternoon today shows that the LFTs are trending upward.  AST 43-->57 ALT:66->74 Patient counseled that her clinical symptoms, the labs all will indicate whether or not to proceed with delivery, obviously the goal is to keep her pregnant  For as long as possible.  BP has improved with Labetalol and are mild range now.  She will steroid complete today at 04:45 am Continue magnesium sulfate for seizure prevention.  Yanni Ruberg STACIA

## 2014-08-05 ENCOUNTER — Inpatient Hospital Stay (HOSPITAL_COMMUNITY): Payer: BC Managed Care – PPO | Admitting: Anesthesiology

## 2014-08-05 ENCOUNTER — Encounter (HOSPITAL_COMMUNITY): Payer: BC Managed Care – PPO | Admitting: Anesthesiology

## 2014-08-05 ENCOUNTER — Encounter (HOSPITAL_COMMUNITY)
Admission: AD | Disposition: A | Discharge status: Home / Self Care | Payer: Self-pay | Source: Ambulatory Visit | Attending: Obstetrics and Gynecology

## 2014-08-05 ENCOUNTER — Encounter (HOSPITAL_COMMUNITY): Payer: Self-pay | Admitting: Certified Registered Nurse Anesthetist

## 2014-08-05 LAB — COMPREHENSIVE METABOLIC PANEL
ALK PHOS: 146 U/L — AB (ref 39–117)
ALT: 87 U/L — AB (ref 0–35)
ALT: 103 U/L — AB (ref 0–35)
AST: 80 U/L — AB (ref 0–37)
AST: 101 U/L — ABNORMAL HIGH (ref 0–37)
Albumin: 2.5 g/dL — ABNORMAL LOW (ref 3.5–5.2)
Albumin: 2.5 g/dL — ABNORMAL LOW (ref 3.5–5.2)
Alkaline Phosphatase: 125 U/L — ABNORMAL HIGH (ref 39–117)
Anion gap: 10 (ref 5–15)
Anion gap: 9 (ref 5–15)
BUN: 12 mg/dL (ref 6–23)
BUN: 12 mg/dL (ref 6–23)
CALCIUM: 7.2 mg/dL — AB (ref 8.4–10.5)
CO2: 24 mEq/L (ref 19–32)
CO2: 26 mEq/L (ref 19–32)
CREATININE: 0.6 mg/dL (ref 0.50–1.10)
Calcium: 8.3 mg/dL — ABNORMAL LOW (ref 8.4–10.5)
Chloride: 105 mEq/L (ref 96–112)
Chloride: 102 mEq/L (ref 96–112)
Creatinine, Ser: 0.61 mg/dL (ref 0.50–1.10)
GFR calc Af Amer: >90 mL/min (ref >90)
GFR calc non Af Amer: >90 mL/min (ref >90)
GLUCOSE: 105 mg/dL — AB (ref 70–99)
Glucose, Bld: 97 mg/dL (ref 70–99)
Potassium: 4.4 mEq/L (ref 3.7–5.3)
Potassium: 4.1 mEq/L (ref 3.7–5.3)
SODIUM: 139 meq/L (ref 137–147)
SODIUM: 137 meq/L (ref 137–147)
Total Bilirubin: <0.2 mg/dL — ABNORMAL LOW (ref 0.3–1.2)
Total Bilirubin: <0.2 mg/dL — ABNORMAL LOW (ref 0.3–1.2)
Total Protein: 5.8 g/dL — ABNORMAL LOW (ref 6.0–8.3)
Total Protein: 5.6 g/dL — ABNORMAL LOW (ref 6.0–8.3)

## 2014-08-05 LAB — CULTURE, BETA STREP (GROUP B ONLY)

## 2014-08-05 LAB — CBC
HCT: 31.3 % — ABNORMAL LOW (ref 36.0–46.0)
HCT: 30.4 % — ABNORMAL LOW (ref 36.0–46.0)
HEMATOCRIT: 31.7 % — AB (ref 36.0–46.0)
HEMOGLOBIN: 10.5 g/dL — AB (ref 12.0–15.0)
HEMOGLOBIN: 10.2 g/dL — AB (ref 12.0–15.0)
Hemoglobin: 10.9 g/dL — ABNORMAL LOW (ref 12.0–15.0)
MCH: 31 pg (ref 26.0–34.0)
MCH: 31.8 pg (ref 26.0–34.0)
MCH: 31 pg (ref 26.0–34.0)
MCHC: 33.5 g/dL (ref 30.0–36.0)
MCHC: 34.4 g/dL (ref 30.0–36.0)
MCHC: 33.6 g/dL (ref 30.0–36.0)
MCV: 92.3 fL (ref 78.0–100.0)
MCV: 92.4 fL (ref 78.0–100.0)
MCV: 92.4 fL (ref 78.0–100.0)
PLATELETS: 185 10*3/uL (ref 150–400)
Platelets: 184 10*3/uL (ref 150–400)
Platelets: 147 10*3/uL — ABNORMAL LOW (ref 150–400)
RBC: 3.39 MIL/uL — AB (ref 3.87–5.11)
RBC: 3.43 MIL/uL — ABNORMAL LOW (ref 3.87–5.11)
RBC: 3.29 MIL/uL — AB (ref 3.87–5.11)
RDW: 13.6 % (ref 11.5–15.5)
RDW: 13.6 % (ref 11.5–15.5)
RDW: 13.5 % (ref 11.5–15.5)
WBC: 14.4 10*3/uL — ABNORMAL HIGH (ref 4.0–10.5)
WBC: 14.7 10*3/uL — AB (ref 4.0–10.5)
WBC: 15 10*3/uL — AB (ref 4.0–10.5)

## 2014-08-05 LAB — LACTATE DEHYDROGENASE: LDH: 250 U/L (ref 94–250)

## 2014-08-05 LAB — URIC ACID: URIC ACID, SERUM: 7.4 mg/dL — AB (ref 2.4–7.0)

## 2014-08-05 LAB — PREPARE RBC (CROSSMATCH)

## 2014-08-05 SURGERY — Surgical Case
Anesthesia: Spinal

## 2014-08-05 MED ORDER — DIPHENHYDRAMINE HCL 50 MG/ML IJ SOLN: 12.5000 mg | INTRAMUSCULAR | Status: DC | PRN

## 2014-08-05 MED ORDER — MENTHOL 3 MG MT LOZG: 1.0000 | LOZENGE | OROMUCOSAL | Status: DC | PRN

## 2014-08-05 MED ORDER — IBUPROFEN 600 MG PO TABS: 600.0000 mg | ORAL_TABLET | Freq: Four times a day (QID) | ORAL | Status: DC | PRN

## 2014-08-05 MED ORDER — OXYTOCIN 40 UNITS IN LACTATED RINGERS INFUSION - SIMPLE MED
INTRAVENOUS | Status: DC | PRN
  Administered 2014-08-05: 40 [IU] via INTRAVENOUS

## 2014-08-05 MED ORDER — NALBUPHINE HCL 10 MG/ML IJ SOLN
5.0000 mg | INTRAMUSCULAR | Status: DC | PRN
  Administered 2014-08-05: 10 mg via SUBCUTANEOUS

## 2014-08-05 MED ORDER — LACTATED RINGERS IV SOLN
INTRAVENOUS | Status: DC
Start: 2014-08-05 — End: 2014-08-11
  Administered 2014-08-05 – 2014-08-06 (×2): via INTRAVENOUS

## 2014-08-05 MED ORDER — DIPHENHYDRAMINE HCL 25 MG PO CAPS
25.0000 mg | ORAL_CAPSULE | ORAL | Status: DC | PRN
  Administered 2014-08-06 (×2): 25 mg via ORAL
  Filled 2014-08-05 (×2): qty 1

## 2014-08-05 MED ORDER — SCOPOLAMINE 1 MG/3DAYS TD PT72
MEDICATED_PATCH | TRANSDERMAL | Status: AC
  Administered 2014-08-05: 1.5 mg via TRANSDERMAL
  Filled 2014-08-05: qty 1

## 2014-08-05 MED ORDER — ONDANSETRON HCL 4 MG/2ML IJ SOLN
INTRAMUSCULAR | Status: DC | PRN
  Administered 2014-08-05: 4 mg via INTRAVENOUS

## 2014-08-05 MED ORDER — SIMETHICONE 80 MG PO CHEW
80.0000 mg | CHEWABLE_TABLET | ORAL | Status: DC
  Administered 2014-08-06 (×2): 80 mg via ORAL
  Filled 2014-08-05: qty 1

## 2014-08-05 MED ORDER — NALOXONE HCL 0.4 MG/ML IJ SOLN: 0.4000 mg | INTRAMUSCULAR | Status: DC | PRN

## 2014-08-05 MED ORDER — ONDANSETRON HCL 4 MG PO TABS: 4.0000 mg | ORAL_TABLET | ORAL | Status: DC | PRN

## 2014-08-05 MED ORDER — SODIUM CHLORIDE 0.9 % IJ SOLN: 3.0000 mL | INTRAMUSCULAR | Status: DC | PRN

## 2014-08-05 MED ORDER — PRENATAL MULTIVITAMIN CH
1.0000 | ORAL_TABLET | Freq: Every day | ORAL | Status: DC
  Administered 2014-08-05 – 2014-08-11 (×7): 1 via ORAL
  Filled 2014-08-05 (×7): qty 1

## 2014-08-05 MED ORDER — FENTANYL CITRATE 0.05 MG/ML IJ SOLN
INTRAMUSCULAR | Status: DC | PRN
  Administered 2014-08-05: 25 ug via INTRATHECAL

## 2014-08-05 MED ORDER — MEPERIDINE HCL 25 MG/ML IJ SOLN: 6.2500 mg | INTRAMUSCULAR | Status: DC | PRN

## 2014-08-05 MED ORDER — FAMOTIDINE 20 MG PO TABS: 20.0000 mg | ORAL_TABLET | Freq: Once | ORAL | Status: AC

## 2014-08-05 MED ORDER — CEFAZOLIN SODIUM-DEXTROSE 2-3 GM-% IV SOLR
2.0000 g | Freq: Three times a day (TID) | INTRAVENOUS | Status: DC
  Filled 2014-08-05: qty 50

## 2014-08-05 MED ORDER — NALOXONE HCL 1 MG/ML IJ SOLN: 1.0000 ug/kg/h | INTRAVENOUS | Status: DC | PRN

## 2014-08-05 MED ORDER — MAGNESIUM SULFATE 40 G IN LACTATED RINGERS - SIMPLE
INTRAVENOUS | Status: DC | PRN
  Administered 2014-08-05: 2 g/h via INTRAVENOUS

## 2014-08-05 MED ORDER — CARBOPROST TROMETHAMINE 250 MCG/ML IM SOLN
INTRAMUSCULAR | Status: AC
  Filled 2014-08-05: qty 1

## 2014-08-05 MED ORDER — MAGNESIUM SULFATE BOLUS VIA INFUSION
6.0000 g | Freq: Once | INTRAVENOUS | Status: AC
  Administered 2014-08-05: 6 g via INTRAVENOUS
  Filled 2014-08-05: qty 500

## 2014-08-05 MED ORDER — SIMETHICONE 80 MG PO CHEW
80.0000 mg | CHEWABLE_TABLET | ORAL | Status: DC | PRN
  Filled 2014-08-05: qty 1

## 2014-08-05 MED ORDER — ONDANSETRON HCL 4 MG/2ML IJ SOLN
INTRAMUSCULAR | Status: AC
  Filled 2014-08-05: qty 2

## 2014-08-05 MED ORDER — PROMETHAZINE HCL 25 MG/ML IJ SOLN: 6.2500 mg | INTRAMUSCULAR | Status: DC | PRN

## 2014-08-05 MED ORDER — CEFAZOLIN SODIUM-DEXTROSE 2-3 GM-% IV SOLR
INTRAVENOUS | Status: DC | PRN
  Administered 2014-08-05: 2 g via INTRAVENOUS

## 2014-08-05 MED ORDER — FENTANYL CITRATE 0.05 MG/ML IJ SOLN
INTRAMUSCULAR | Status: AC
Start: 2014-08-05 — End: 2014-08-05
  Filled 2014-08-05: qty 2

## 2014-08-05 MED ORDER — WITCH HAZEL-GLYCERIN EX PADS: 1.0000 "application " | MEDICATED_PAD | CUTANEOUS | Status: DC | PRN

## 2014-08-05 MED ORDER — OXYTOCIN 40 UNITS IN LACTATED RINGERS INFUSION - SIMPLE MED: 62.5000 mL/h | INTRAVENOUS | Status: AC

## 2014-08-05 MED ORDER — MORPHINE SULFATE 0.5 MG/ML IJ SOLN
INTRAMUSCULAR | Status: AC
  Filled 2014-08-05: qty 10

## 2014-08-05 MED ORDER — FENTANYL CITRATE 0.05 MG/ML IJ SOLN
INTRAMUSCULAR | Status: AC
  Administered 2014-08-05: 50 ug via INTRAVENOUS
  Filled 2014-08-05: qty 2

## 2014-08-05 MED ORDER — FENTANYL CITRATE 0.05 MG/ML IJ SOLN
25.0000 ug | INTRAMUSCULAR | Status: DC | PRN
  Administered 2014-08-05: 50 ug via INTRAVENOUS

## 2014-08-05 MED ORDER — PHENYLEPHRINE 8 MG IN D5W 100 ML (0.08MG/ML) PREMIX OPTIME
INJECTION | INTRAVENOUS | Status: DC | PRN
  Administered 2014-08-05: 60 ug/min via INTRAVENOUS

## 2014-08-05 MED ORDER — MIDAZOLAM HCL 2 MG/2ML IJ SOLN: 0.5000 mg | Freq: Once | INTRAMUSCULAR | Status: DC | PRN

## 2014-08-05 MED ORDER — MISOPROSTOL 200 MCG PO TABS
ORAL_TABLET | ORAL | Status: AC
  Filled 2014-08-05: qty 5

## 2014-08-05 MED ORDER — SODIUM CHLORIDE 0.9 % IV SOLN: Freq: Once | INTRAVENOUS | Status: DC

## 2014-08-05 MED ORDER — LACTATED RINGERS IV SOLN: INTRAVENOUS | Status: DC

## 2014-08-05 MED ORDER — LANOLIN HYDROUS EX OINT: 1.0000 "application " | TOPICAL_OINTMENT | CUTANEOUS | Status: DC | PRN

## 2014-08-05 MED ORDER — MAGNESIUM SULFATE 40 G IN LACTATED RINGERS - SIMPLE
2.0000 g/h | INTRAVENOUS | Status: DC
  Administered 2014-08-06: 2 g/h via INTRAVENOUS
  Filled 2014-08-05 (×2): qty 500

## 2014-08-05 MED ORDER — NALBUPHINE HCL 10 MG/ML IJ SOLN
INTRAMUSCULAR | Status: AC
  Filled 2014-08-05: qty 1

## 2014-08-05 MED ORDER — TETANUS-DIPHTH-ACELL PERTUSSIS 5-2.5-18.5 LF-MCG/0.5 IM SUSP
0.5000 mL | Freq: Once | INTRAMUSCULAR | Status: AC
  Administered 2014-08-06: 0.5 mL via INTRAMUSCULAR
  Filled 2014-08-05: qty 0.5

## 2014-08-05 MED ORDER — OXYCODONE-ACETAMINOPHEN 5-325 MG PO TABS
2.0000 | ORAL_TABLET | ORAL | Status: DC | PRN
  Administered 2014-08-07 (×2): 2 via ORAL
  Filled 2014-08-05 (×2): qty 2

## 2014-08-05 MED ORDER — NALBUPHINE HCL 10 MG/ML IJ SOLN: 5.0000 mg | INTRAMUSCULAR | Status: DC | PRN

## 2014-08-05 MED ORDER — OXYCODONE-ACETAMINOPHEN 5-325 MG PO TABS
1.0000 | ORAL_TABLET | ORAL | Status: DC | PRN
  Administered 2014-08-07: 1 via ORAL
  Filled 2014-08-05: qty 1

## 2014-08-05 MED ORDER — KETOROLAC TROMETHAMINE 30 MG/ML IJ SOLN: 30.0000 mg | Freq: Four times a day (QID) | INTRAMUSCULAR | Status: DC | PRN

## 2014-08-05 MED ORDER — LACTATED RINGERS IV SOLN
INTRAVENOUS | Status: DC | PRN
  Administered 2014-08-05 (×2): via INTRAVENOUS

## 2014-08-05 MED ORDER — CITRIC ACID-SODIUM CITRATE 334-500 MG/5ML PO SOLN
ORAL | Status: AC
  Administered 2014-08-05: 10:00:00
  Filled 2014-08-05: qty 15

## 2014-08-05 MED ORDER — MORPHINE SULFATE (PF) 0.5 MG/ML IJ SOLN
INTRAMUSCULAR | Status: DC | PRN
  Administered 2014-08-05: .15 mg via INTRATHECAL

## 2014-08-05 MED ORDER — OXYTOCIN 10 UNIT/ML IJ SOLN
INTRAMUSCULAR | Status: AC
  Filled 2014-08-05: qty 4

## 2014-08-05 MED ORDER — ACETAMINOPHEN 500 MG PO TABS
1000.0000 mg | ORAL_TABLET | Freq: Four times a day (QID) | ORAL | Status: AC
  Administered 2014-08-05 – 2014-08-06 (×4): 1000 mg via ORAL
  Filled 2014-08-05 (×4): qty 2

## 2014-08-05 MED ORDER — ONDANSETRON HCL 4 MG/2ML IJ SOLN: 4.0000 mg | Freq: Three times a day (TID) | INTRAMUSCULAR | Status: DC | PRN

## 2014-08-05 MED ORDER — SCOPOLAMINE 1 MG/3DAYS TD PT72
1.0000 | MEDICATED_PATCH | Freq: Once | TRANSDERMAL | Status: AC
  Administered 2014-08-05: 1.5 mg via TRANSDERMAL

## 2014-08-05 MED ORDER — PHENYLEPHRINE 8 MG IN D5W 100 ML (0.08MG/ML) PREMIX OPTIME
INJECTION | INTRAVENOUS | Status: AC
  Filled 2014-08-05: qty 100

## 2014-08-05 MED ORDER — SENNOSIDES-DOCUSATE SODIUM 8.6-50 MG PO TABS
2.0000 | ORAL_TABLET | ORAL | Status: DC
  Administered 2014-08-06 – 2014-08-09 (×5): 2 via ORAL
  Filled 2014-08-05: qty 2
  Filled 2014-08-05: qty 1
  Filled 2014-08-05: qty 2
  Filled 2014-08-05: qty 1
  Filled 2014-08-05 (×2): qty 2

## 2014-08-05 MED ORDER — IBUPROFEN 600 MG PO TABS
600.0000 mg | ORAL_TABLET | Freq: Four times a day (QID) | ORAL | Status: DC
  Administered 2014-08-05 – 2014-08-11 (×23): 600 mg via ORAL
  Filled 2014-08-05 (×23): qty 1

## 2014-08-05 MED ORDER — ZOLPIDEM TARTRATE 5 MG PO TABS: 5.0000 mg | ORAL_TABLET | Freq: Every evening | ORAL | Status: DC | PRN

## 2014-08-05 MED ORDER — DIPHENHYDRAMINE HCL 50 MG/ML IJ SOLN: 25.0000 mg | INTRAMUSCULAR | Status: DC | PRN

## 2014-08-05 MED ORDER — BUPIVACAINE IN DEXTROSE 0.75-8.25 % IT SOLN
INTRATHECAL | Status: DC | PRN
  Administered 2014-08-05: 1.4 mL via INTRATHECAL

## 2014-08-05 MED ORDER — ONDANSETRON HCL 4 MG/2ML IJ SOLN: 4.0000 mg | INTRAMUSCULAR | Status: DC | PRN

## 2014-08-05 MED ORDER — DIPHENHYDRAMINE HCL 25 MG PO CAPS
25.0000 mg | ORAL_CAPSULE | Freq: Four times a day (QID) | ORAL | Status: DC | PRN
Start: 2014-08-05 — End: 2014-08-11
  Administered 2014-08-05: 25 mg via ORAL
  Filled 2014-08-05: qty 1

## 2014-08-05 MED ORDER — SIMETHICONE 80 MG PO CHEW
80.0000 mg | CHEWABLE_TABLET | Freq: Three times a day (TID) | ORAL | Status: DC
  Administered 2014-08-05 – 2014-08-11 (×16): 80 mg via ORAL
  Filled 2014-08-05 (×15): qty 1

## 2014-08-05 MED ORDER — METOCLOPRAMIDE HCL 5 MG/ML IJ SOLN: 10.0000 mg | Freq: Three times a day (TID) | INTRAMUSCULAR | Status: DC | PRN

## 2014-08-05 MED ORDER — DIBUCAINE 1 % RE OINT: 1.0000 "application " | TOPICAL_OINTMENT | RECTAL | Status: DC | PRN

## 2014-08-05 SURGICAL SUPPLY — 35 items
BLADE SURG 10 STRL SS (BLADE) ×6 IMPLANT
CLAMP CORD UMBIL (MISCELLANEOUS) IMPLANT
CLOSURE WOUND 1/2 X4 (GAUZE/BANDAGES/DRESSINGS) ×1
CLOTH BEACON ORANGE TIMEOUT ST (SAFETY) ×3 IMPLANT
DERMABOND ADVANCED (GAUZE/BANDAGES/DRESSINGS)
DERMABOND ADVANCED .7 DNX12 (GAUZE/BANDAGES/DRESSINGS) IMPLANT
DRAPE LG THREE QUARTER DISP (DRAPES) IMPLANT
DRSG OPSITE POSTOP 4X10 (GAUZE/BANDAGES/DRESSINGS) ×3 IMPLANT
DURAPREP 26ML APPLICATOR (WOUND CARE) ×3 IMPLANT
ELECT REM PT RETURN 9FT ADLT (ELECTROSURGICAL) ×3
ELECTRODE REM PT RTRN 9FT ADLT (ELECTROSURGICAL) ×1 IMPLANT
EXTRACTOR VACUUM M CUP 4 TUBE (SUCTIONS) IMPLANT
EXTRACTOR VACUUM M CUP 4' TUBE (SUCTIONS)
GLOVE BIO SURGEON STRL SZ 6 (GLOVE) ×3 IMPLANT
GLOVE INDICATOR 6.0 STRL GRN (GLOVE) ×3 IMPLANT
GOWN STRL REUS W/TWL LRG LVL3 (GOWN DISPOSABLE) ×6 IMPLANT
KIT ABG SYR 3ML LUER SLIP (SYRINGE) IMPLANT
NEEDLE HYPO 25X5/8 SAFETYGLIDE (NEEDLE) IMPLANT
NS IRRIG 1000ML POUR BTL (IV SOLUTION) ×3 IMPLANT
PACK C SECTION WH (CUSTOM PROCEDURE TRAY) ×3 IMPLANT
PAD OB MATERNITY 4.3X12.25 (PERSONAL CARE ITEMS) ×3 IMPLANT
RTRCTR C-SECT PINK 25CM LRG (MISCELLANEOUS) IMPLANT
STRIP CLOSURE SKIN 1/2X4 (GAUZE/BANDAGES/DRESSINGS) ×2 IMPLANT
SUT MNCRL 0 VIOLET CTX 36 (SUTURE) ×2 IMPLANT
SUT MNCRL AB 3-0 PS2 27 (SUTURE) ×3 IMPLANT
SUT MONOCRYL 0 CTX 36 (SUTURE) ×4
SUT PLAIN 0 NONE (SUTURE) IMPLANT
SUT PLAIN 2 0 XLH (SUTURE) ×3 IMPLANT
SUT VIC AB 0 CT1 27 (SUTURE) ×4
SUT VIC AB 0 CT1 27XBRD ANBCTR (SUTURE) ×2 IMPLANT
SUT VIC AB 2-0 CT1 27 (SUTURE)
SUT VIC AB 2-0 CT1 TAPERPNT 27 (SUTURE) IMPLANT
TOWEL OR 17X24 6PK STRL BLUE (TOWEL DISPOSABLE) ×3 IMPLANT
TRAY FOLEY CATH 14FR (SET/KITS/TRAYS/PACK) ×3 IMPLANT
WATER STERILE IRR 1000ML POUR (IV SOLUTION) ×3 IMPLANT

## 2014-08-05 NOTE — Lactation Note (Signed)
This note was copied from the chart of Evelyn [name]. Lactation Consultation Note   Initial consult with this experienced breast feeding mom. She pumped 45 mls with her first pumping. Baby is 4 hours old and 27 4/[redacted] weeks gestation. Review of NICU booklet done with mom, as well as lactation services. Mom in [place]. Has a history of abundant milk supply. She will need a 2 week DEP loaner.   Patient Name: Evelyn [name] GGYIR'S Date: 08/05/2014 Reason for consult: Initial assessment;NICU baby   Maternal Data Formula Feeding for Exclusion: Yes (baby in NICU, but baby may not get formula due to mom's history of over abundant milk supply) Has patient been taught Hand Expression?: Yes Does the patient have breastfeeding experience prior to this delivery?: Yes  Feeding    LATCH Score/Interventions                      Lactation Tools Discussed/Used WIC Program: No (mom will get DEP from insurance, and will need 2 week laoner) Initiated by:: Arbie Cookey , RN within 4 hours of delivery Date initiated:: 08/05/14   Consult Status Consult Status: Follow-up Date: 08/06/14 Follow-up type: In-patient    Tonna Corner 08/05/2014, 4:47 PM

## 2014-08-05 NOTE — Progress Notes (Signed)
In to see and exam patient. She is currently out of room visiting baby in NICU.  VS and Uop reviewed BP 136/86  Pulse 60  Temp(Src) 98.1 F (36.7 C) (Oral)  Resp 18  Ht 5\' 1"  (1.549 m)  Wt 61.78 kg (136 lb 3.2 oz)  BMI 25.75 kg/m2  SpO2 96%  LMP 01/24/2014  Breastfeeding? Unknown Uop >100 cc/hr [id number] POD# 1 s/p 1 LTCS at [redacted]w[redacted]d for preeclampsia w severe features.   Cont Mg for 24hr PP.   BPs currently mild range.  Cont labetalol 200 mg bid. Will repeat labs now and q12 hr until LFTs trend down.

## 2014-08-05 NOTE — Progress Notes (Signed)
CTBS to evaluate patient and her BPs.  Last dose of labetalol at 0600.  HA and epigastric pain this AM, though pain is somewhat improved with pepcid.   BPs this AM are significantly elevated in the 160-170/80-100 with repeat measurements over 2 hours.  In addition, CMP this AM is significant for increasing LFTs (AST 80, ALT 87).  Plt and Cr stable.    On arrival to the room, pt is laying in bed, eyes covered 2/2 pain from headache RRR CTAB Mild TTP epigatric area Ext: 2+ DTRs, no clonus    Filed Vitals:   08/05/14 0917 08/05/14 0932 08/05/14 0941 08/05/14 0951  BP: 170/90 163/91 161/94 136/89  Pulse: 56 57 56 73  Temp:    98.4 F (36.9 C)  TempSrc:    Oral  Resp:   18 18  Height:      Weight:      SpO2:       G4P3 @ [redacted]w[redacted]d with preeclampsia with severe features.  Is severe by HA, epigastric pain, severe range BPs and abnl LF Ts.  BMZ course has been completed (9/1 and 9/2 at 0430).  Discussed w Dr. Lisbeth Renshaw, MFM.  Given LFTs cont to trend up (80 and 87) and severe range BPs, the recommendation is to proceed to the OR for delivery.  Breech presentation confirmed and on bedside US  this AM  Discussed the MFM recommendations in detail w Ms. Hashimi and her husband.   She was consented for a cesarean section.  Discussed risk of possible classical incision given gestational age.  Also addressed her desire to proceed w BTL.  Given early gestational age, they elected to defer at this time.    Repeat cbc w stable platelets Neonatology alerted.  6gm Mg bolus prior to OR.   \

## 2014-08-05 NOTE — Anesthesia Postprocedure Evaluation (Signed)
Anesthesia Post Note  Patient: Evelyn [name]  Procedure(s) Performed: Procedure(s) (LRB): CESAREAN SECTION (N/A)  Anesthesia type: Spinal  Patient location: PACU  Post pain: Pain level controlled  Post assessment: Post-op Vital signs reviewed  Last Vitals:  Filed Vitals:   08/05/14 1245  BP:   Pulse: 53  Temp:   Resp: 20    Post vital signs: Reviewed  Level of consciousness: awake  Complications: No apparent anesthesia complications

## 2014-08-05 NOTE — Progress Notes (Signed)
0600 Pt awake talking with mother no distress noted.  After coming from bathroom from having bowel movement pt holding stomach and complaining of Epigastric pain below  sternum and pain radiating to her back.  Pt began to hyperventilate while holding her stomach.  Pt states that pain was continuous.  Abdomen palpated and soft.

## 2014-08-05 NOTE — Brief Op Note (Signed)
08/02/2014 - 08/05/2014  12:40 PM  PATIENT:  Evelyn [name]  28 y.o. female  PRE-OPERATIVE DIAGNOSIS:  Primary cesarean [name] for se3vere preeclampsia  POST-OPERATIVE DIAGNOSIS:  Primary cesarean [name] for severe preeclampsia  PROCEDURE:  Procedure(s): CESAREAN [name] (N/A)  SURGEON:  Surgeon(s) and Role:    * Jerelyn Charles, MD - Primary  PHYSICIAN ASSISTANT:   ASSISTANTS:  ANESTHESIA:   spinal  EBL:  Total I/O In: 2000 [I.V.:2000] Out: 1825 [Urine:1075; Blood:750]  BLOOD ADMINISTERED:none  DRAINS: foley catheter   LOCAL MEDICATIONS USED:  NONE  SPECIMEN:  Source of Specimen:  placenta  DISPOSITION OF SPECIMEN:  PATHOLOGY  COUNTS:  YES  TOURNIQUET:  * No tourniquets in log *  DICTATION: .Note written in EPIC  PLAN OF CARE: Admit to inpatient   PATIENT DISPOSITION:  PACU - hemodynamically stable.   Delay start of Pharmacological VTE agent (>24hrs) due to surgical blood loss or risk of bleeding: not applicable

## 2014-08-05 NOTE — Anesthesia Procedure Notes (Signed)
Spinal  Patient location during procedure: OR Start time: 08/05/2014 10:14 AM Staffing Anesthesiologist: Rudean Curt Performed by: anesthesiologist  Preanesthetic Checklist Completed: patient identified, site marked, surgical consent, pre-op evaluation, timeout performed, IV checked, risks and benefits discussed and monitors and equipment checked Spinal Block Patient position: sitting Prep: DuraPrep Patient monitoring: heart rate, cardiac monitor, continuous pulse ox and blood pressure Approach: midline Location: L3-4 Injection technique: single-shot Needle Needle type: Sprotte  Needle gauge: 24 G Needle length: 9 cm Assessment Sensory level: T4 Additional Notes Patient identified.  Risk benefits discussed including failed block, incomplete pain control, headache, nerve damage, paralysis, blood pressure changes, nausea, vomiting, reactions to medication both toxic or allergic, and postpartum back pain.  Patient expressed understanding and wished to proceed.  All questions were answered.  Sterile technique used throughout procedure.  CSF was clear.  No parasthesia or other complications.  Please see nursing notes for vital signs.

## 2014-08-05 NOTE — Transfer of Care (Signed)
Immediate Anesthesia Transfer of Care Note  Patient: Evelyn [name]  Procedure(s) Performed: Procedure(s): CESAREAN SECTION (N/A)  Patient Location: PACU  Anesthesia Type:Spinal  Level of Consciousness: awake, alert , oriented and patient cooperative  Airway & Oxygen Therapy: Patient Spontanous Breathing  Post-op Assessment: Report given to PACU RN and Post -op Vital signs reviewed and stable  Post vital signs: Reviewed and stable  Complications: No apparent anesthesia complications

## 2014-08-05 NOTE — Op Note (Signed)
Cesarean Section Procedure Note  Indications: malpresentation, severe preeclampsia  Pre-operative Diagnosis: IUP @ [redacted]w[redacted]d, severe preeclampsia, Malpresentation  Post-operative Diagnosis: same  Surgeon: Jerelyn Charles, MD  Assistants:   Estimated Blood Loss: 85mL         Drains: Foley catheter         Specimens: Placenta to pathology         Implants: none         Complications:  None; patient tolerated the procedure well.         Disposition: PACU - hemodynamically stable.    Anesthesia: Spinal anesthesia  Findings: Viable female infant, delivered from the frank breech position.  Apgars 6, 9,  Weight 860g Normal ovaries and tubes bilaterally  Procedure Details   The patient was counseled about the risks, benefits, complications of the cesarean section. The patient concurred with the proposed plan, giving informed consent.   The patient was taken to Operating Room.  Spinal anesthesia was obtained.   A Time Out was held and the patient's name, consent and procedure were verified. information confirmed.  After spinal was found to adequate , the patient was placed in the dorsal supine position with a leftward tilt, draped and prepped in the usual sterile manner. A Pfannenstiel incision was made and carried down through the subcutaneous tissue to the fascia.  The fascia was incised in the midline and the fascial incision was extended laterally with Mayo scissors. The superior aspect of the fascial incision was grasped with Kocher clamps x2, tented up and the rectus muscles dissected off bluntly.  The rectus was then dissected off with blunt dissection and Mayo scissors inferiorly. The rectus muscles were separated in the midline. The abdominal peritoneum was identified, tented up, entered bluntly. The bladder blade was inserted.  The vesicouterine peritoneum was identified, tented up, entered sharply with Metzenbaum scissors, and the bladder flap was created digitally.  A well developed  lower uterine segment was identified. Scalpel was then used to make a low transverse incision on the uterus which was extended laterally with  blunt dissection. The fetal breech was identified, brought to the hysterotomy, and delivered with the usual breech maneuvers and gentle traction to flex the fetal head. The cord was cut and clamped, and the baby handed off to the neonatologist.    Placenta was then delivered spontaneously, intact and appear normal, the uterus was cleared of all clot and debris. The uterine incision was repaired with #0 Monocryl in running locked fashion. An additional figure of eight suture was placed near the right corner.  Bovie cautery was used to achieve hemostasis of the serosal layer. The incision was hemostatic. Ovaries and tubes were inspected and normal.  The abdominal cavity was cleared of all clot and debris. Thre rectus were inspected and hemostatic.  The fascia was closed with 0 Vicryl in a running fashion. The subcuticular layer was irrigated and all bleeders cauterized.    The skin was closed with 3-0 monocryl in a subcuticular fashio. The incision was dressed with benzoine, steri strips and pressure dressing. All sponge lap and needle counts were correct x3. Patient tolerated the procedure well and recovered in stable condition following the procedure.  Instrument, sponge, and needle counts were correct prior the abdominal closure and at the conclusion of the case.     [place], [facility]

## 2014-08-05 NOTE — Anesthesia Preprocedure Evaluation (Signed)
Anesthesia Evaluation  Patient identified by MRN, date of birth, ID band Patient awake    Reviewed: Allergy & Precautions, H&P , NPO status , Patient's Chart, lab work & pertinent test results  Airway Mallampati: II      Dental   Pulmonary  breath sounds clear to auscultation        Cardiovascular Exercise Tolerance: Good hypertension, Rhythm:regular Rate:Normal     Neuro/Psych    GI/Hepatic GERD-  ,  Endo/Other    Renal/GU      Musculoskeletal   Abdominal   Peds  Hematology   Anesthesia Other Findings Worsening LFTs  Reproductive/Obstetrics (+) Pregnancy                           Anesthesia Physical Anesthesia Plan  ASA: III and emergent  Anesthesia Plan: Spinal   Post-op Pain Management:    Induction:   Airway Management Planned:   Additional Equipment:   Intra-op Plan:   Post-operative Plan:   Informed Consent: I have reviewed the patients History and Physical, chart, labs and discussed the procedure including the risks, benefits and alternatives for the proposed anesthesia with the patient or authorized representative who has indicated his/her understanding and acceptance.     Plan Discussed with: Anesthesiologist, CRNA and Surgeon  Anesthesia Plan Comments:         Anesthesia Quick Evaluation

## 2014-08-06 ENCOUNTER — Encounter (HOSPITAL_COMMUNITY): Payer: Self-pay | Admitting: Obstetrics

## 2014-08-06 LAB — COMPREHENSIVE METABOLIC PANEL
ALT: 82 U/L — ABNORMAL HIGH (ref 0–35)
AST: 74 U/L — ABNORMAL HIGH (ref 0–37)
Albumin: 2.3 g/dL — ABNORMAL LOW (ref 3.5–5.2)
Alkaline Phosphatase: 116 U/L (ref 39–117)
Anion gap: 7 (ref 5–15)
BUN: 12 mg/dL (ref 6–23)
CALCIUM: 6.9 mg/dL — AB (ref 8.4–10.5)
CO2: 27 mEq/L (ref 19–32)
CREATININE: 0.68 mg/dL (ref 0.50–1.10)
Chloride: 100 mEq/L (ref 96–112)
GFR calc non Af Amer: >90 mL/min (ref >90)
Glucose, Bld: 91 mg/dL (ref 70–99)
Potassium: 4.4 mEq/L (ref 3.7–5.3)
Sodium: 134 mEq/L — ABNORMAL LOW (ref 137–147)
Total Bilirubin: <0.2 mg/dL — ABNORMAL LOW (ref 0.3–1.2)
Total Protein: 5.4 g/dL — ABNORMAL LOW (ref 6.0–8.3)

## 2014-08-06 LAB — CBC
HEMATOCRIT: 27.9 % — AB (ref 36.0–46.0)
Hemoglobin: 9.3 g/dL — ABNORMAL LOW (ref 12.0–15.0)
MCH: 31 pg (ref 26.0–34.0)
MCHC: 33.3 g/dL (ref 30.0–36.0)
MCV: 93 fL (ref 78.0–100.0)
Platelets: 140 10*3/uL — ABNORMAL LOW (ref 150–400)
RBC: 3 MIL/uL — AB (ref 3.87–5.11)
RDW: 13.7 % (ref 11.5–15.5)
WBC: 13.2 10*3/uL — ABNORMAL HIGH (ref 4.0–10.5)

## 2014-08-06 LAB — RPR

## 2014-08-06 MED ORDER — VALACYCLOVIR HCL 500 MG PO TABS
500.0000 mg | ORAL_TABLET | Freq: Two times a day (BID) | ORAL | Status: DC
  Administered 2014-08-06 – 2014-08-11 (×10): 500 mg via ORAL
  Filled 2014-08-06 (×10): qty 1

## 2014-08-06 NOTE — Progress Notes (Signed)
POD #1 s/p c/s for breech with HELLP Patient is eating, ambulating, voiding.  Pain control is good. + flatus.  No CP/SOB.  No HA, vision change or RUQ pain. No N/V/fever.  No other complaints.  Filed Vitals:   08/06/14 0923 08/06/14 1050 08/06/14 1100 08/06/14 1217  BP: 132/86 126/76 131/77 138/85  Pulse: 72   62  Temp:    98.6 F (37 C)  TempSrc:    Oral  Resp: 18  18 20   Height:      Weight:      SpO2: 99%   97%   CTAB [employer]: c/d/i  Lab Results  Component Value Date   WBC 13.2* 08/06/2014   HGB 9.3* 08/06/2014   HCT 27.9* 08/06/2014   MCV 93.0 08/06/2014   PLT 140* 08/06/2014    --/--/O POS, O POS (09/01 0510)  A/P Post op day #1 s/p c/s breech, HELLP LFTs improving Platelets stable, repeat labs in am. Labetalol discontinued, follow BPs, currently in normal and mild range. Mag to be discontinued at 24hrs post c/s.  Will monitor in AICU for 4 additional hours and if vitals stable and UOP good with no further changes, will transfer to postpartum.  Routine care.    Allyn Kenner

## 2014-08-06 NOTE — Progress Notes (Signed)
08/06/14 1500  Clinical Encounter Type  Visited With Patient  Visit Type Initial;Spiritual support;Social support  Spiritual Encounters  Spiritual Needs Emotional   Visited to introduce Spiritual Care and ongoing chaplain availability; limited visit because pt was tired from NICU visit and needed to pump.  She was appreciative to know of support.  [place] plans to follow up in NICU, but please page as needs arise:  [phone].  Thank you.  [place], [place]

## 2014-08-07 LAB — TYPE AND SCREEN
ABO/RH(D): O POS
Antibody Screen: NEGATIVE
UNIT DIVISION: 0
Unit division: 0

## 2014-08-07 LAB — COMPREHENSIVE METABOLIC PANEL
ALT: 75 U/L — AB (ref 0–35)
AST: 54 U/L — ABNORMAL HIGH (ref 0–37)
Albumin: 2.8 g/dL — ABNORMAL LOW (ref 3.5–5.2)
Alkaline Phosphatase: 170 U/L — ABNORMAL HIGH (ref 39–117)
Anion gap: 11 (ref 5–15)
BUN: 15 mg/dL (ref 6–23)
CALCIUM: 8.8 mg/dL (ref 8.4–10.5)
CO2: 25 meq/L (ref 19–32)
Chloride: 100 mEq/L (ref 96–112)
Creatinine, Ser: 0.79 mg/dL (ref 0.50–1.10)
GLUCOSE: 80 mg/dL (ref 70–99)
Potassium: 5.1 mEq/L (ref 3.7–5.3)
Sodium: 136 mEq/L — ABNORMAL LOW (ref 137–147)
Total Bilirubin: <0.2 mg/dL — ABNORMAL LOW (ref 0.3–1.2)
Total Protein: 6.7 g/dL (ref 6.0–8.3)

## 2014-08-07 LAB — CBC
HCT: 33.2 % — ABNORMAL LOW (ref 36.0–46.0)
HEMOGLOBIN: 11.1 g/dL — AB (ref 12.0–15.0)
MCH: 31.5 pg (ref 26.0–34.0)
MCHC: 33.4 g/dL (ref 30.0–36.0)
MCV: 94.3 fL (ref 78.0–100.0)
Platelets: 185 10*3/uL (ref 150–400)
RBC: 3.52 MIL/uL — AB (ref 3.87–5.11)
RDW: 13.8 % (ref 11.5–15.5)
WBC: 15.9 10*3/uL — ABNORMAL HIGH (ref 4.0–10.5)

## 2014-08-07 LAB — RPR

## 2014-08-07 NOTE — Lactation Note (Addendum)
This note was copied from the chart of Evelyn Tema Alire. Lactation Consultation Note    Follow up consult with this mom of a NICU baby, now 48 hours old, and 27 6/7 weeks CGA. Mom is pumping and still has a great supply. Denies any breast discomfort. I reviewed with mom and dad how to properly clean pump parts,and encouraged them for call insurance for a DEP. I gave mom the paper work to fill out for a 2 weeks pump rental, mom should not be discharged until Monday, 9/7   Mom very quiet today - I asked if she was tired, and dad said she just came back from seeing the baby. Mom seemed sad, and when I asked her if she was, she slightly nodded, and did not deny this. I alerted mom's nurse of this. Mom also has not gotten to hold the baby yet, which makes things even more difficult for mom. Mom knows to call for questions/concerns.  Patient Name: Evelyn [name] [id number] Date: 08/07/2014 Reason for consult: Follow-up assessment;NICU baby;Infant < 6lbs   Maternal Data    Feeding Feeding Type: Breast Milk Length of feed: 5 min  LATCH Score/Interventions                      Lactation Tools Discussed/Used Pump Review: Setup, frequency, and cleaning   Consult Status Consult Status: Follow-up Date: 08/08/14 Follow-up type: In-patient    Tonna [place] 08/07/2014, 12:57 PM

## 2014-08-07 NOTE — Progress Notes (Signed)
Patient is eating, ambulating, voiding.  Pain control is good.  Appropriate lochia.  No flatus.  No HA/vision change/RUQ pain.  Feeling well, visiting baby in NICU.  Filed Vitals:   08/06/14 1715 08/06/14 2100 08/07/14 0142 08/07/14 0531  BP: 154/94 133/86 125/69 132/83  Pulse: 67 66 71 61  Temp: 98.6 F (37 C) 97.7 F (36.5 C) 98.8 F (37.1 C) 98.6 F (37 C)  TempSrc: Oral Oral Oral Oral  Resp: 18 18 16 16   Height:      Weight:    59.761 kg (131 lb 12 oz)  SpO2: 100% 99% 98% 99%    Fundus firm Perineum without swelling. Inc: c/d/i Ext: no CT  Lab Results  Component Value Date   WBC 15.9* 08/07/2014   HGB 11.1* 08/07/2014   HCT 33.2* 08/07/2014   MCV 94.3 08/07/2014   PLT 185 08/07/2014    --/--/O POS, O POS (09/01 0510)  A/P Post op day #2 s/p c/s at 27+ weeks for severe preeclampsia/HELLP LFTs continuing to trend down, will recheck in am. BPs normal and mild range without medication.  Routine care.   Allyn Kenner

## 2014-08-08 ENCOUNTER — Inpatient Hospital Stay (HOSPITAL_COMMUNITY): Payer: BC Managed Care – PPO

## 2014-08-08 LAB — COMPREHENSIVE METABOLIC PANEL
ALBUMIN: 2.7 g/dL — AB (ref 3.5–5.2)
ALK PHOS: 164 U/L — AB (ref 39–117)
ALT: 146 U/L — ABNORMAL HIGH (ref 0–35)
ALT: 252 U/L — AB (ref 0–35)
AST: 169 U/L — AB (ref 0–37)
AST: 328 U/L — ABNORMAL HIGH (ref 0–37)
Albumin: 3.1 g/dL — ABNORMAL LOW (ref 3.5–5.2)
Alkaline Phosphatase: 153 U/L — ABNORMAL HIGH (ref 39–117)
Anion gap: 11 (ref 5–15)
Anion gap: 12 (ref 5–15)
BILIRUBIN TOTAL: 0.3 mg/dL (ref 0.3–1.2)
BUN: 17 mg/dL (ref 6–23)
BUN: 16 mg/dL (ref 6–23)
CHLORIDE: 98 meq/L (ref 96–112)
CO2: 24 mEq/L (ref 19–32)
CO2: 25 mEq/L (ref 19–32)
CREATININE: 0.57 mg/dL (ref 0.50–1.10)
Calcium: 9.6 mg/dL (ref 8.4–10.5)
Calcium: 9.5 mg/dL (ref 8.4–10.5)
Chloride: 100 mEq/L (ref 96–112)
Creatinine, Ser: 0.56 mg/dL (ref 0.50–1.10)
GFR calc Af Amer: >90 mL/min (ref >90)
GFR calc non Af Amer: >90 mL/min (ref >90)
GFR calc non Af Amer: >90 mL/min (ref >90)
GLUCOSE: 76 mg/dL (ref 70–99)
Glucose, Bld: 81 mg/dL (ref 70–99)
POTASSIUM: 4.9 meq/L (ref 3.7–5.3)
Potassium: 5.1 mEq/L (ref 3.7–5.3)
SODIUM: 135 meq/L — AB (ref 137–147)
Sodium: 135 mEq/L — ABNORMAL LOW (ref 137–147)
TOTAL PROTEIN: 6.3 g/dL (ref 6.0–8.3)
TOTAL PROTEIN: 7.3 g/dL (ref 6.0–8.3)
Total Bilirubin: 0.2 mg/dL — ABNORMAL LOW (ref 0.3–1.2)

## 2014-08-08 LAB — CBC
HCT: 32.5 % — ABNORMAL LOW (ref 36.0–46.0)
Hemoglobin: 10.9 g/dL — ABNORMAL LOW (ref 12.0–15.0)
MCH: 31.7 pg (ref 26.0–34.0)
MCHC: 33.5 g/dL (ref 30.0–36.0)
MCV: 94.5 fL (ref 78.0–100.0)
Platelets: 157 K/uL (ref 150–400)
RBC: 3.44 MIL/uL — ABNORMAL LOW (ref 3.87–5.11)
RDW: 13.6 % (ref 11.5–15.5)
WBC: 12.8 K/uL — ABNORMAL HIGH (ref 4.0–10.5)

## 2014-08-08 LAB — AMYLASE: Amylase: 44 U/L (ref 0–105)

## 2014-08-08 LAB — LIPASE, BLOOD: Lipase: 32 U/L (ref 11–59)

## 2014-08-08 LAB — HEPATITIS A ANTIBODY, IGM: Hep A IgM: NONREACTIVE

## 2014-08-08 LAB — HEPATITIS B CORE ANTIBODY, IGM: Hep B C IgM: NONREACTIVE

## 2014-08-08 LAB — HEPATITIS C ANTIBODY: HCV Ab: NEGATIVE

## 2014-08-08 MED ORDER — OXYCODONE HCL 5 MG PO TABS
5.0000 mg | ORAL_TABLET | ORAL | Status: DC | PRN
  Administered 2014-08-08 – 2014-08-10 (×4): 5 mg via ORAL
  Filled 2014-08-08 (×4): qty 1

## 2014-08-08 MED ORDER — CALCIUM CARBONATE ANTACID 500 MG PO CHEW: 400.0000 mg | CHEWABLE_TABLET | Freq: Three times a day (TID) | ORAL | Status: DC | PRN

## 2014-08-08 NOTE — Progress Notes (Signed)
Patient is eating, ambulating, voiding.  Pain control is good. +flatus.  No epigastric or RUQ pain.  No HA, vision change.  No CP/SOB.  No N/V/D/C.  Feeling well without complaints.  Filed Vitals:   08/07/14 1400 08/07/14 1805 08/07/14 2115 08/08/14 0546  BP: 136/90 127/93 129/84 121/85  Pulse: 67 89 80 67  Temp: 98.7 F (37.1 C) 98.7 F (37.1 C) 98.7 F (37.1 C) 98.3 F (36.8 C)  TempSrc: Oral Oral Oral Oral  Resp: 16 16 18 16   Height:      Weight:      SpO2: 99% 100% 100% 99%   Abd: RUQ non-tender on deep palpation, appropriate post-surgical discomfort Fundus firm Inc: c/d/i Ext: no CT  Lab Results  Component Value Date   WBC 12.8* 08/08/2014   HGB 10.9* 08/08/2014   HCT 32.5* 08/08/2014   MCV 94.5 08/08/2014   PLT 157 08/08/2014    --/--/O POS, O POS (09/01 0510)  A/P Post op day #3 from 27+ week c/s for preeclampsia with severe features, HELLP LFTs were trending down postoperatively, however, repeat this morning shows more than doubling since yesterday, pt asymptomatic.  Will order acute hepatitis panel, repeat LFTs, amylase, lipase and RUQ [place] for 11am.  T/C GI consult pending results. BPs have remained in normal/low mild range on no medication.  Plts stable.  Routine care.     Allyn Kenner

## 2014-08-08 NOTE — Progress Notes (Signed)
Consulted by phone with Dr. Jessie [facility]. We discussed the patients abnormal lab findings, specifically her increased LFTs, after having been declining postpartum and normal RUQ ultrasound. Pt reports feeling well, with only mild burning of epigastrum when stomach is empty. We discussed pt's use of valtrex, Dr. Winnifred Friar did not believe this was a contributing factor.  Pt's tylenol medications have already been discontinued. He recommends given her clinical picture, to monitor with repeat LFTs in am.  Can consider alternative if she begins feeling poorly throughout the day or evening. He remains available for consultation through 08/09/14 at [phone].

## 2014-08-09 LAB — CBC
HCT: 33.7 % — ABNORMAL LOW (ref 36.0–46.0)
HEMOGLOBIN: 11.3 g/dL — AB (ref 12.0–15.0)
MCH: 31.7 pg (ref 26.0–34.0)
MCHC: 33.5 g/dL (ref 30.0–36.0)
MCV: 94.7 fL (ref 78.0–100.0)
Platelets: 176 10*3/uL (ref 150–400)
RBC: 3.56 MIL/uL — AB (ref 3.87–5.11)
RDW: 13.8 % (ref 11.5–15.5)
WBC: 9.9 10*3/uL (ref 4.0–10.5)

## 2014-08-09 LAB — COMPREHENSIVE METABOLIC PANEL
ALK PHOS: 168 U/L — AB (ref 39–117)
ALT: 382 U/L — ABNORMAL HIGH (ref 0–35)
AST: 404 U/L — ABNORMAL HIGH (ref 0–37)
Albumin: 3 g/dL — ABNORMAL LOW (ref 3.5–5.2)
Anion gap: 11 (ref 5–15)
BUN: 24 mg/dL — ABNORMAL HIGH (ref 6–23)
CALCIUM: 9.8 mg/dL (ref 8.4–10.5)
CO2: 24 meq/L (ref 19–32)
Chloride: 103 mEq/L (ref 96–112)
Creatinine, Ser: 0.7 mg/dL (ref 0.50–1.10)
GLUCOSE: 85 mg/dL (ref 70–99)
POTASSIUM: 5.6 meq/L — AB (ref 3.7–5.3)
Sodium: 138 mEq/L (ref 137–147)
Total Bilirubin: 0.2 mg/dL — ABNORMAL LOW (ref 0.3–1.2)
Total Protein: 6.6 g/dL (ref 6.0–8.3)

## 2014-08-09 MED ORDER — FAMOTIDINE 20 MG PO TABS
20.0000 mg | ORAL_TABLET | Freq: Two times a day (BID) | ORAL | Status: DC
  Administered 2014-08-09 – 2014-08-11 (×5): 20 mg via ORAL
  Filled 2014-08-09 (×5): qty 1

## 2014-08-09 NOTE — Plan of Care (Signed)
Problem: Discharge Progression Outcomes Goal: Barriers To Progression Addressed/Resolved Outcome: Progressing Blood pressures have been WNL,DTR's WNL,

## 2014-08-09 NOTE — Plan of Care (Signed)
Problem: Discharge Progression Outcomes Goal: Complications resolved/controlled Outcome: Completed/Met Date Met:  08/09/14 Blood pressures have remained well within normal parameters. Goal: Discharge plan in place and appropriate Outcome: Completed/Met Date Met:  08/09/14  VSS Vaginal bleeding WNL Pain controlled Patient understands signs and symptoms of PIH and to call MD Understands self care and F/U

## 2014-08-09 NOTE — Progress Notes (Signed)
Blood drawn today unable to add ammonia.  Pt looks well.  I have added ammonia and PT to CMET draw tomorrow but instructed lab to hold running until CMET is reviewed by physician.  If LFTS go up again, it may be helpful to check those labs for early signs of acute fatty liver of pregnancy.  I have not added bile acids for cholestasis of pregnancy since she is not having itching.  Bili is low but BUN is slightly elevate.

## 2014-08-09 NOTE — Progress Notes (Signed)
Clinical Social Work Department PSYCHOSOCIAL ASSESSMENT - MATERNAL/CHILD 08/09/2014  Patient:  Evelyn [name],Evelyn [name]  Account Number:  [id number]  Admit Date:  08/02/2014  Childs Name:   Evelyn [name]    Clinical Social Worker:  COLLEEN SHAW, LCSW   Date/Time:  08/09/2014 10:00 AM  Date Referred:        Other referral source:   No referral-NICU admission    I:  FAMILY / HOME ENVIRONMENT Child's legal guardian:  PARENT  Guardian - Name Guardian - Age Guardian - Address  Evelyn [name] 28 [address], [place], [place] [address]  Evelyn [name]  same   Other household support members/support persons Name Relationship DOB  Evelyn [name] SON 2004  Evelyn [name] SON 2009  Evelyn [name] SON 2011   Other support:   Parents report each other are their main supports.  MGM lives in the area and is currently helping FOB care for the 3 boys.  (Oldest boy is FOB's stepson.  Two other children are the couple's biological children).  PGM is visiting from [place], but is traveling home today.    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  FOB is [name] Finance Manager at [employer] in [place].   Financial resources:  Private Insurance If Medicaid - County:    School / Grade:   Maternity Care Coordinator / Child Services Coordination / Early Interventions:   Baby will qualify for Early Intervention services, CDSA and CC4C.  Cultural issues impacting care:   None stated.    III  STRENGTHS Strengths  Adequate Resources  Compliance with medical plan  Home prepared for Child (including basic supplies)  Other - See comment  Supportive family/friends  Understanding of illness   Strength comment:  Parents appear very supportive of each other.  They report pediatric follow up will take place at [facility] on [address], where they take their three sons.   IV  RISK FACTORS AND CURRENT  PROBLEMS Current Problem:  None   Risk Factor & Current Problem Patient Issue Family Issue Risk Factor / Current Problem Comment   N N     V  SOCIAL WORK ASSESSMENT  CSW met with parents in [name]'s third floor room 319 to introduce myself, complete assessment due to baby's admission to NICU at 27.4 weeks, and to offer support.  Parents were pleasant and welcoming.  [name] states she is feeling much better physically today, however, she is not discharging.  She acknowledged that she has had [name] difficult week due to physical illness.  CSW asked parents how they feel they are coping emotionally with baby's premature birth.  [name] became tearful and states, "it's been hard."  CSW validated her feelings and looked to FOB for [name] response.  He agreed and stated everything (in regards to [name]'s pregnancy) was going great until last Monday.  [name] reports coming in with what she thought were Braxton Hicks contractions and ending up delivering the baby.  CSW normalized the couple's emotions as well as encouraged them to allow themselves to feel however they are feeling in response to the situation.  CSW discussed common emotions related to [name] premature birth and admission to NICU as well as signs and symptoms of PPD and when CSW becomes concerned.  CSW asked that parents watch for these signs and speak to CSW and or [name]'s OB if symptoms becoming extreme or prolonged.  [name] agreed.    CSW discussed what to expect from their daughter's admission to NICU, in general terms.  Parents seemed appreciative of this information.  Parents report having almost everything ready for baby at home, except the car seat which one of the grandmothers plans to buy.  CSW recommends asking grandma to wait until baby is close to discharge in order to see if she will need [name] preemie car seat, which is likely.  They stated understanding.  CSW explained that baby's RN can help them if they need to know what to buy.  When asked who their main support people  are, parents replied that they are each other's main supports.  FOB states they have family in town for the holiday, but mainly it's just the two of them, since everyone has their own lives.  He states they "go back to reality tomorrow."  CSW asked him how he feels about this.  FOB responded, "we will be all right."  FOB states he plans to return to work at the end of this week or next.  They report no issues with transportation.  They understand that their children will be able to visit baby until the start of RSV season when there are added restrictions.  CSW ensured that they know that parents are [name] welcomed presence at any time, but stressed the importance of resting and healing to [name].  CSW explained baby's eligibility for SSI and to apply at the [place] if they wish to apply.  CSW explained ongoing support services offered by NICU CSW and gave contact information.  Parents thanked CSW for the visit and state no questions, concerns or needs at this time.     VI SOCIAL WORK PLAN Social Work Plan  Psychosocial Support/Ongoing Assessment of Needs  Patient/Family Education   Type of pt/family education:   PPD signs and symptoms  What to expect from [name] NICU admission, in general terms  Baby's eligibility for [employer] Income  Ongoing support services offered by NICU CSW   If child protective services report - county:   If child protective services report - date:   Information/referral to community resources comment:   No referral needs noted at this time.   Other social work plan:

## 2014-08-09 NOTE — Progress Notes (Signed)
Ur chart review completed.  

## 2014-08-09 NOTE — Progress Notes (Addendum)
Patient is eating, ambulating, voiding.  Pain control is good.  +flatus. No epigastric or RUQ pain. No HA, vision change. No CP/SOB. No N/V. Feeling well without complaints and eating normally.  Pt had loose stool yesterday without blood or odd color.  No signs of bleeding.     Filed Vitals:   08/08/14 2136 08/09/14 0233 08/09/14 0520 08/09/14 0603  BP: 121/79 115/66  120/68  Pulse: 86 68  66  Temp: 98.2 F (36.8 C) 98.4 F (36.9 C)  98 F (36.7 C)  TempSrc: Oral Oral  Oral  Resp: _0 Height:      Weight:   57.6 kg (126 lb 15.8 oz)   SpO2: 99% 99%  98%    lungs:   clear to auscultation cor:    RRR Abdomen:  soft, appropriate tenderness, incisions intact and without erythema or exudate ex:    no cords   Results for orders placed during the hospital encounter of 08/02/14 (from the past 48 hour(s))  CBC     Status: Abnormal   Collection Time    08/08/14  5:23 AM      Result Value Ref Range   WBC 12.8 (*) 4.0 - 10.5 K/uL   RBC 3.44 (*) 3.87 - 5.11 MIL/uL   Hemoglobin 10.9 (*) 12.0 - 15.0 g/dL   HCT 32.5 (*) 36.0 - 46.0 %   MCV 94.5  78.0 - 100.0 fL   MCH 31.7  26.0 - 34.0 pg   MCHC 33.5  30.0 - 36.0 g/dL   RDW 13.6  11.5 - 15.5 %   Platelets 157  150 - 400 K/uL  COMPREHENSIVE METABOLIC PANEL     Status: Abnormal   Collection Time    08/08/14  5:23 AM      Result Value Ref Range   Sodium 135 (*) 137 - 147 mEq/L   Potassium 5.1  3.7 - 5.3 mEq/L   Chloride 100  96 - 112 mEq/L   CO2 24  19 - 32 mEq/L   Glucose, Bld 81  70 - 99 mg/dL   BUN 17  6 - 23 mg/dL   Creatinine, Ser 0.57  0.50 - 1.10 mg/dL   Calcium 9.6  8.4 - 10.5 mg/dL   Total Protein 6.3  6.0 - 8.3 g/dL   Albumin 2.7 (*) 3.5 - 5.2 g/dL   AST 169 (*) 0 - 37 U/L   ALT 146 (*) 0 - 35 U/L   Alkaline Phosphatase 153 (*) 39 - 117 U/L   Total Bilirubin 0.2 (*) 0.3 - 1.2 mg/dL   GFR calc non Af Amer >90  >90 mL/min   GFR calc Af Amer >90  >90 mL/min   Comment: (NOTE)     The eGFR has been calculated using  the CKD EPI equation.     This calculation has not been validated in all clinical situations.     eGFR's persistently <90 mL/min signify possible Chronic Kidney     Disease.   Anion gap 11  5 - 15  AMYLASE     Status: None   Collection Time    08/08/14 10:49 AM      Result Value Ref Range   Amylase 44  0 - 105 U/L   Comment: Performed at [facility] PANEL     Status: Abnormal   Collection Time    08/08/14 10:49 AM      Result Value Ref Range  Sodium 135 (*) 137 - 147 mEq/L   Potassium 4.9  3.7 - 5.3 mEq/L   Chloride 98  96 - 112 mEq/L   CO2 25  19 - 32 mEq/L   Glucose, Bld 76  70 - 99 mg/dL   BUN 16  6 - 23 mg/dL   Creatinine, Ser 0.56  0.50 - 1.10 mg/dL   Calcium 9.5  8.4 - 10.5 mg/dL   Total Protein 7.3  6.0 - 8.3 g/dL   Albumin 3.1 (*) 3.5 - 5.2 g/dL   AST 328 (*) 0 - 37 U/L   ALT 252 (*) 0 - 35 U/L   Alkaline Phosphatase 164 (*) 39 - 117 U/L   Total Bilirubin 0.3  0.3 - 1.2 mg/dL   GFR calc non Af Amer >90  >90 mL/min   GFR calc Af Amer >90  >90 mL/min   Comment: (NOTE)     The eGFR has been calculated using the CKD EPI equation.     This calculation has not been validated in all clinical situations.     eGFR's persistently <90 mL/min signify possible Chronic Kidney     Disease.   Anion gap 12  5 - 15  HEPATITIS A ANTIBODY, IGM     Status: None   Collection Time    08/08/14 10:49 AM      Result Value Ref Range   Hep A IgM NON REACTIVE  NON REACTIVE   Comment: Performed at [facility]  HEPATITIS B CORE ANTIBODY, IGM     Status: None   Collection Time    08/08/14 10:49 AM      Result Value Ref Range   Hep B C IgM NON REACTIVE  NON REACTIVE   Comment: (NOTE)     High levels of Hepatitis B Core IgM antibody are detectable     during the acute stage of Hepatitis B. This antibody is used     to differentiate current from past HBV infection.     Performed at [facility]  HEPATITIS C ANTIBODY     Status: None    Collection Time    08/08/14 10:49 AM      Result Value Ref Range   HCV Ab NEGATIVE  NEGATIVE   Comment: Performed at [facility]  LIPASE, BLOOD     Status: None   Collection Time    08/08/14 10:49 AM      Result Value Ref Range   Lipase 32  11 - 59 U/L   Comment: Performed at [facility]  CBC     Status: Abnormal   Collection Time    08/09/14  5:10 AM      Result Value Ref Range   WBC 9.9  4.0 - 10.5 K/uL   RBC 3.56 (*) 3.87 - 5.11 MIL/uL   Hemoglobin 11.3 (*) 12.0 - 15.0 g/dL   HCT 33.7 (*) 36.0 - 46.0 %   MCV 94.7  78.0 - 100.0 fL   MCH 31.7  26.0 - 34.0 pg   MCHC 33.5  30.0 - 36.0 g/dL   RDW 13.8  11.5 - 15.5 %   Platelets 176  150 - 400 K/uL  COMPREHENSIVE METABOLIC PANEL     Status: Abnormal   Collection Time    08/09/14  5:10 AM      Result Value Ref Range   Sodium 138  137 - 147 mEq/L   Potassium 5.6 (*) 3.7 - 5.3 mEq/L     Chloride 103  96 - 112 mEq/L   CO2 24  19 - 32 mEq/L   Glucose, Bld 85  70 - 99 mg/dL   BUN 24 (*) 6 - 23 mg/dL   Creatinine, Ser 0.70  0.50 - 1.10 mg/dL   Calcium 9.8  8.4 - 10.5 mg/dL   Total Protein 6.6  6.0 - 8.3 g/dL   Albumin 3.0 (*) 3.5 - 5.2 g/dL   AST 404 (*) 0 - 37 U/L   ALT 382 (*) 0 - 35 U/L   Alkaline Phosphatase 168 (*) 39 - 117 U/L   Total Bilirubin 0.2 (*) 0.3 - 1.2 mg/dL   GFR calc non Af Amer >90  >90 mL/min   GFR calc Af Amer >90  >90 mL/min   Comment: (NOTE)     The eGFR has been calculated using the CKD EPI equation.     This calculation has not been validated in all clinical situations.     eGFR's persistently <90 mL/min signify possible Chronic Kidney     Disease.   Anion gap 11  5 - 15   Abd [place] normal  --/--/O POS, O POS (09/01 0510)/RI  A/P    Post operative day 4 from emergent C/S for Hellp at 27 weeks.  LFTS still trending upward but smaller increase since yesterday.  Amylase, lipase, Cr, Hepatitis studies and glucose all normal.  Will check ammonia level and PT tomorrow- BUN is slightly  increased- especially if LFTs go up even more to check on acute fatty liver of pregnancy.    Routine post op and postpartum care.  Currently no other signs of HELLP-H/H and plts stable.  Will recheck LFTs tomorrow and reconsult GI if pt worsens with either sx or increased ammonia.    Ibuprofen and oxycodone for pain control.

## 2014-08-09 NOTE — Progress Notes (Signed)
I visited with Evelyn [name] and Evelyn [name] and they reported that they've been having a difficult time.  They are still somewhat in shock over all of the events of the past week and in shock that their baby is in the NICU.  They asked specifically for spiritual support, including literature to help with their emotional pain.  They were raised Catholic, but are open to other traditions as well.  I provided them with a book of Psalms and read them Psalm 121 and also provided them with a prayer shawl and prayed with them.    We will continue to follow up for additional support, but please also page as needs arise.  [employer] Pager, [phone] 3:51 PM

## 2014-08-10 LAB — CBC
HCT: 34.4 % — ABNORMAL LOW (ref 36.0–46.0)
Hemoglobin: 11.5 g/dL — ABNORMAL LOW (ref 12.0–15.0)
MCH: 31.4 pg (ref 26.0–34.0)
MCHC: 33.4 g/dL (ref 30.0–36.0)
MCV: 94 fL (ref 78.0–100.0)
PLATELETS: 211 10*3/uL (ref 150–400)
RBC: 3.66 MIL/uL — ABNORMAL LOW (ref 3.87–5.11)
RDW: 13.7 % (ref 11.5–15.5)
WBC: 10.5 10*3/uL (ref 4.0–10.5)

## 2014-08-10 LAB — COMPREHENSIVE METABOLIC PANEL
ALT: 253 U/L — ABNORMAL HIGH (ref 0–35)
AST: 159 U/L — ABNORMAL HIGH (ref 0–37)
Albumin: 3 g/dL — ABNORMAL LOW (ref 3.5–5.2)
Alkaline Phosphatase: 161 U/L — ABNORMAL HIGH (ref 39–117)
Anion gap: 9 (ref 5–15)
BUN: 26 mg/dL — ABNORMAL HIGH (ref 6–23)
CO2: 23 meq/L (ref 19–32)
CREATININE: 0.66 mg/dL (ref 0.50–1.10)
Calcium: 9.8 mg/dL (ref 8.4–10.5)
Chloride: 102 mEq/L (ref 96–112)
GFR calc Af Amer: >90 mL/min (ref >90)
GLUCOSE: 95 mg/dL (ref 70–99)
Potassium: 5.1 mEq/L (ref 3.7–5.3)
Sodium: 134 mEq/L — ABNORMAL LOW (ref 137–147)
Total Protein: 7.2 g/dL (ref 6.0–8.3)

## 2014-08-10 LAB — PROTIME-INR
INR: 0.87 (ref 0.00–1.49)
Prothrombin Time: 11.8 seconds (ref 11.6–15.2)

## 2014-08-10 LAB — AMMONIA: Ammonia: 33 umol/L (ref 11–60)

## 2014-08-10 NOTE — Progress Notes (Signed)
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 rate 16, height 5\' 1"  (1.549 m), weight 57.38 kg (126 lb 8 oz), last menstrual period 01/24/2014, SpO2 99.00%, unknown if currently breastfeeding.  Physical Exam:  General: alert, cooperative, appears stated age and no distress Lochia: appropriate Uterine Fundus: firm Incision: healing well, no significant drainage, no dehiscence, no significant erythema DVT Evaluation: No evidence of DVT seen on physical exam. Negative Homan's sign. No cords or calf tenderness.   Recent Labs  08/09/14 0510 08/10/14 0535  HGB 11.3* 11.5*  HCT 33.7* 34.4*   Results for orders placed during the hospital encounter of 08/02/14 (from the past 24 hour(s))  AMMONIA     Status: None   Collection Time    08/10/14  5:35 AM      Result Value Ref Range   Ammonia 33  11 - 60 umol/L  PROTIME-INR     Status: None   Collection Time    08/10/14  5:35 AM      Result Value Ref Range   Prothrombin Time 11.8  11.6 - 15.2 seconds   INR 0.87  0.00 - 1.49  COMPREHENSIVE METABOLIC PANEL     Status: Abnormal   Collection Time    08/10/14  5:35 AM      Result Value Ref Range   Sodium 134 (*) 137 - 147 mEq/L   Potassium 5.1  3.7 - 5.3 mEq/L   Chloride 102  96 - 112 mEq/L   CO2 23  19 - 32 mEq/L   Glucose, Bld 95  70 - 99 mg/dL   BUN 26 (*) 6 - 23 mg/dL   Creatinine, Ser 0.66  0.50 - 1.10 mg/dL   Calcium 9.8  8.4 - 10.5 mg/dL   Total Protein 7.2  6.0 - 8.3 g/dL   Albumin 3.0 (*) 3.5 - 5.2 g/dL   AST 159 (*) 0 - 37 U/L   ALT 253 (*) 0 - 35 U/L   Alkaline Phosphatase 161 (*) 39 - 117 U/L   Total Bilirubin <0.2 (*) 0.3 - 1.2 mg/dL   GFR calc non Af Amer >90  >90 mL/min   GFR calc Af Amer >90  >90 mL/min   Anion  gap 9  5 - 15  CBC     Status: Abnormal   Collection Time    08/10/14  5:35 AM      Result Value Ref Range   WBC 10.5  4.0 - 10.5 K/uL   RBC 3.66 (*) 3.87 - 5.11 MIL/uL   Hemoglobin 11.5 (*) 12.0 - 15.0 g/dL   HCT 34.4 (*) 36.0 - 46.0 %   MCV 94.0  78.0 - 100.0 fL   MCH 31.4  26.0 - 34.0 pg   MCHC 33.4  30.0 - 36.0 g/dL   RDW 13.7  11.5 - 15.5 %   Platelets 211  150 - 400 K/uL     Assessment/Plan: POD #5 s/p Primary Cesarean delivery with transaminitis, LFTs now trending down. Platelets  Now within normal limits.  Plan for discharge tomorrow with close f/u for BP check in office.  Blood pressures have been in normal range.    LOS: 8 days   Jashay Roddy, [place] 08/10/2014, 6:06 PM

## 2014-08-10 NOTE — Progress Notes (Signed)
08/10/14 1700  Clinical Encounter Type  Visited With Patient  Visit Type Follow-up   Attempted f/u, but pt pumping.  [place] will follow, but please also page as needs arise.  Thank you.  [place], [place]

## 2014-08-11 MED ORDER — INFLUENZA VAC SPLIT QUAD 0.5 ML IM SUSY
0.5000 mL | PREFILLED_SYRINGE | INTRAMUSCULAR | Status: AC | PRN
  Administered 2014-08-11: 0.5 mL via INTRAMUSCULAR

## 2014-08-11 MED ORDER — OXYCODONE HCL 5 MG PO TABS: 5.0000 mg | ORAL_TABLET | ORAL | Status: DC | PRN

## 2014-08-11 NOTE — Progress Notes (Signed)
Discharge instructions provided to patient and significant other at bedside.  Activity, medications, follow up appointments, incision care, when to call the doctor and community resources discussed.  No questions at this time.  Patient left unit in stable condition with all personal belongings and rented reast pump accompanied by staff.  Leighton Roach, RN--------------------

## 2014-08-11 NOTE — Discharge Summary (Signed)
Obstetric Discharge Summary Reason for Admission: cesarean section Prenatal Procedures: NST, Preeclampsia and ultrasound Intrapartum Procedures: cesarean: low cervical, transverse Postpartum Procedures: none Complications-Operative and Postpartum: none Hemoglobin  Date Value Ref Range Status  08/10/2014 11.5* 12.0 - 15.0 g/dL Final  01/09/2012 12.3  12.2 - 16.2 g/dL Final     HCT  Date Value Ref Range Status  08/10/2014 34.4* 36.0 - 46.0 % Final     HCT, POC  Date Value Ref Range Status  01/09/2012 38.1  37.7 - 47.9 % Final    Physical Exam:  General: alert Lochia: appropriate Uterine Fundus: firm Incision: healing well DVT Evaluation: No evidence of DVT seen on physical exam.  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 Schedule an appointment as soon as possible for a visit in 2 days.   Specialty:  Obstetrics and Gynecology   Contact information:   [address] [place] [address] [phone]       Newborn Data: Live born female  Birth Weight: 1 lb 14.3 oz (860 g) APGAR: 6, 9  Home with in NICU.  Karlis Cregg E 08/11/2014, 9:30 AM

## 2014-08-11 NOTE — Progress Notes (Signed)
Evelyn [name] and Evelyn [name] were in better spirits today even though they were not looking forward to leaving their daughter here.  Evelyn [name] is able to take a little more time off to help ease the transition and to be present while baby has some significant tests over the next few days.  Their family has gone home now and they are on their own.    They are aware that chaplains continue to be available to them in the NICU.  We will follow up as we see them there.  [place] Pager, [phone] 2:49 PM   08/11/14 1400  Clinical Encounter Type  Visited With Patient  Visit Type Spiritual support  Spiritual Encounters  Spiritual Needs Emotional

## 2014-08-18 ENCOUNTER — Ambulatory Visit (HOSPITAL_COMMUNITY): Payer: BC Managed Care – PPO | Attending: Obstetrics and Gynecology

## 2014-08-26 ENCOUNTER — Ambulatory Visit: Payer: Self-pay

## 2014-08-26 NOTE — Lactation Note (Signed)
This note was copied from the chart of Evelyn Renda Pohlman. Lactation Consultation Note Mom request help with her hand pump, states she tried to use it for the first time but got no milk. States DEP works fine and gets milk out. Adjusted the hand pump white flap, and showed mom how the flap has to be flat on the yellow piece. Mom then attempted to pump with immediate milk let down. Pump now works fine. Enc mom to call if she has any further concerns.   Patient Name: Evelyn [name] Date: 08/26/2014     Maternal Data    Feeding Feeding Type: Breast Milk Length of feed: 30 min  LATCH Score/Interventions                      Lactation Tools Discussed/Used     Consult Status      Dorise Bullion 08/26/2014, 12:12 PM

## 2014-09-01 ENCOUNTER — Ambulatory Visit: Payer: Self-pay

## 2014-09-01 NOTE — Lactation Note (Signed)
This note was copied from the chart of Evelyn Jodelle Fausto. Lactation Consultation Note  Follow up visit in NICU.  Mom states pumping is going well but concerned she is producing an ounce more on one breast.  She is obtaining 3-4 ounces total.  Discussed pumping more frequently may increase supply.  Overall supply is good.  Encouraged to call with concern/questions prn.  Patient Name: Evelyn [name] [id number] Date: 09/01/2014     Maternal Data    Feeding Feeding Type: Breast Milk Length of feed: 30 min  LATCH Score/Interventions                      Lactation Tools Discussed/Used     Consult Status      Ave Filter 09/01/2014, 4:16 PM

## 2014-10-04 ENCOUNTER — Encounter (HOSPITAL_COMMUNITY): Payer: Self-pay | Admitting: Obstetrics

## 2014-10-05 ENCOUNTER — Ambulatory Visit: Payer: Self-pay

## 2014-10-05 NOTE — Lactation Note (Signed)
This note was copied from the chart of Evelyn [name]. Lactation Consultation Note  Met with mom in the NICU.  She states that she started putting baby to breast last week and she is latching easily and nursing well.  Mom has a good supply and feels good breast softening after feeding.  Discussed doing a pre/post weight sometime this week.  Patient Name: Evelyn Shuntel Fishburn [id number] Date: 10/05/2014     Maternal Data    Feeding    LATCH Score/Interventions                      Lactation Tools Discussed/Used     Consult Status      Ave Filter 10/05/2014, 3:10 PM

## 2014-10-15 ENCOUNTER — Ambulatory Visit: Payer: Self-pay

## 2014-10-15 NOTE — Lactation Note (Signed)
This note was copied from the chart of Evelyn Shelbi Vaccaro. Lactation Consultation Note    Follow up phone consult with this mom of a 65 month old NICU baby, now 44 5/7 weeks CGA. Mom has been putting the baby to breast at least once a day, and now the baby has oral thrush. I was called by crista Ward, and she expressed her concern for mom. I called mom and advised her to call her OB MD and ask to begin treatment for yeast, since the baby and mom need to be treated at the same time. i advised mom to ask for all purpose nipple cream, and suggested she have this called in to [facility], in [facility]. Mom agreed and understood the importance of this.   Patient Name: Evelyn [name] [id number] Date: 10/15/2014 Reason for consult: Follow-up assessment   Maternal Data    Feeding Feeding Type: Breast Milk Nipple Type: Other (Dr. Kara Mead Premie) Length of feed: 35 min  LATCH Score/Interventions                      Lactation Tools Discussed/Used     Consult Status Consult Status: PRN Follow-up type: In-patient (NICU)    Tonna Corner 10/15/2014, 6:24 PM

## 2014-10-19 ENCOUNTER — Ambulatory Visit: Payer: Self-pay

## 2014-10-19 NOTE — Lactation Note (Signed)
This note was copied from the chart of Evelyn Shaketa Serafin. Lactation Consultation Note  Mom continues to have an abundant milk supply.  She states since the baby is getting treated for thrush she is not putting her to breast as much.  Mom is using APNO on her nipples and denies symptoms.  Baby is working on bottle feeds.  Encouraged to call with concerns.  Patient Name: Evelyn [name] [id number] Date: 10/19/2014     Maternal Data    Feeding Feeding Type: Breast Milk Length of feed: 45 min (po x 20 min Ng x25 min)  LATCH Score/Interventions                      Lactation Tools Discussed/Used     Consult Status      Ave Filter 10/19/2014, 1:29 PM

## 2014-10-21 ENCOUNTER — Emergency Department (INDEPENDENT_AMBULATORY_CARE_PROVIDER_SITE_OTHER)
Admission: EM | Admit: 2014-10-21 | Discharge: 2014-10-21 | Disposition: A | Discharge status: Home / Self Care | Payer: BC Managed Care – PPO | Source: Home / Self Care | Attending: Emergency Medicine | Admitting: Emergency Medicine

## 2014-10-21 ENCOUNTER — Encounter (HOSPITAL_COMMUNITY): Payer: Self-pay | Admitting: Emergency Medicine

## 2014-10-21 DIAGNOSIS — J069 Acute upper respiratory infection, unspecified: Secondary | ICD-10-CM

## 2014-10-21 LAB — POCT RAPID STREP A: Streptococcus, Group A Screen (Direct): NEGATIVE

## 2014-10-21 MED ORDER — HYDROCOD POLST-CHLORPHEN POLST 10-8 MG/5ML PO LQCR: 5.0000 mL | Freq: Two times a day (BID) | ORAL | Status: DC | PRN

## 2014-10-21 MED ORDER — IPRATROPIUM BROMIDE 0.06 % NA SOLN: 2.0000 | Freq: Four times a day (QID) | NASAL | Status: DC

## 2014-10-21 NOTE — ED Provider Notes (Signed)
Chief Complaint   Sore Throat   History of Present Illness   Evelyn [name] is a 28 year old female who's had a history since this morning of sore throat on the right side, dry cough at nighttime, and nasal congestion with thick white drainage. She denies fever, chills, headache, swollen glands, wheezing, chest pain, or GI symptoms. She's been exposed one of her sons has been sick with a virus and 11 weeks ago she delivered a 27 week premature child. Her daughter is still in the neonatal intensive care unit.  Review of Systems   Other than as noted above, the patient denies any of the following symptoms: Systemic:  No fevers, chills, sweats, or myalgias. Eye:  No redness or discharge. ENT:  No ear pain, headache, nasal congestion, drainage, sinus pressure, or sore throat. Neck:  No neck pain, stiffness, or swollen glands. Lungs:  No cough, sputum production, hemoptysis, wheezing, chest tightness, shortness of breath or chest pain. GI:  No abdominal pain, nausea, vomiting or diarrhea.  Evelyn [name]   Past medical history, family history, social history, meds, and allergies were reviewed.   Physical exam   Vital signs:  BP 107/68 mmHg  Pulse 67  Temp(Src) 97.1 F (36.2 C) (Oral)  Resp 18  SpO2 100% General:  Alert and oriented.  In no distress.  Skin warm and dry. Eye:  No conjunctival injection or drainage. Lids were normal. ENT:  TMs and canals were normal, without erythema or inflammation.  Nasal mucosa was clear and uncongested, without drainage.  Mucous membranes were moist.  Pharynx was mildly erythematous with no exudate or drainage.  There were no oral ulcerations or lesions. Neck:  Supple, no adenopathy, tenderness or mass. Lungs:  No respiratory distress.  Lungs were clear to auscultation, without wheezes, rales or rhonchi.  Breath sounds were clear and equal bilaterally.  Heart:  Regular rhythm, without gallops, murmers or rubs. Skin:  Clear, warm, and dry, without rash or  lesions.  Labs   Results for orders placed or performed during the hospital encounter of 10/21/14  POCT rapid strep A [facility])  Result Value Ref Range   Streptococcus, Group A Screen (Direct) NEGATIVE NEGATIVE    Assessment     The encounter diagnosis was Viral URI.  There is no evidence of pneumonia, strep throat, sinusitis, otitis media.  She is contagious, therefore must stay away from her daughter in the neonatal intensive care unit until she is symptom-free. The patient was told to pump and discard breast milk while she's on the cough syrup, the patient states she has some frozen breast milk that the child could have.  Plan    1.  Meds:  The following meds were prescribed:   Discharge Medication List as of 10/21/2014  6:15 PM    START taking these medications   Details  chlorpheniramine-HYDROcodone (TUSSIONEX) 10-8 MG/5ML LQCR Take 5 mLs by mouth every 12 (twelve) hours as needed for cough., Starting 10/21/2014, Until Discontinued, Normal    ipratropium (ATROVENT) 0.06 % nasal spray Place 2 sprays into both nostrils 4 (four) times daily., Starting 10/21/2014, Until Discontinued, Normal        2.  Patient Education/Counseling:  The patient was given appropriate handouts, self care instructions, and instructed in symptomatic relief.  Instructed to get extra fluids and extra rest.    3.  Follow up:  The patient was told to follow up here if no better in 3 to 4 days, or sooner if becoming worse  in any way, and given some red flag symptoms such as increasing fever, difficulty breathing, chest pain, or persistent vomiting which would prompt immediate return.       Harden Mo, MD 10/21/14 [phone]

## 2014-10-21 NOTE — ED Notes (Signed)
Pt states that she has had a cough and sore throat since 10/19/2014.

## 2014-10-21 NOTE — Discharge Instructions (Signed)

## 2014-10-23 LAB — CULTURE, GROUP A STREP

## 2014-11-02 ENCOUNTER — Ambulatory Visit: Payer: Self-pay

## 2014-11-02 NOTE — Lactation Note (Signed)
This note was copied from the chart of Evelyn [name]. Lactation Consultation Note  Mom roomed in with baby last night.  Baby is mostly getting bottle fed with expressed milk with formula added to increase calories to 24 calorie/ounce.  Mom puts baby to breast some and states baby still snacks at breast.  Milk supply has been excellent.  Encouraged mom to call for outpatient lactation appointment after discharge.  Patient Name: Evelyn [name] [id number] Date: 11/02/2014     Maternal Data    Feeding    LATCH Score/Interventions                      Lactation Tools Discussed/Used     Consult Status      Ave Filter 11/02/2014, 12:24 PM

## 2014-11-15 ENCOUNTER — Other Ambulatory Visit: Payer: Self-pay | Admitting: Obstetrics and Gynecology

## 2014-11-17 ENCOUNTER — Ambulatory Visit (INDEPENDENT_AMBULATORY_CARE_PROVIDER_SITE_OTHER): Payer: BC Managed Care – PPO | Admitting: Family Medicine

## 2014-11-17 ENCOUNTER — Encounter: Payer: Self-pay | Admitting: Family Medicine

## 2014-11-17 VITALS — BP 100/60 | HR 68 | Temp 98.1°F | Resp 16 | Ht 61.5 in | Wt 117.6 lb

## 2014-11-17 DIAGNOSIS — B009 Herpesviral infection, unspecified: Secondary | ICD-10-CM

## 2014-11-17 DIAGNOSIS — Z139 Encounter for screening, unspecified: Secondary | ICD-10-CM

## 2014-11-17 DIAGNOSIS — R7989 Other specified abnormal findings of blood chemistry: Secondary | ICD-10-CM

## 2014-11-17 DIAGNOSIS — R945 Abnormal results of liver function studies: Secondary | ICD-10-CM

## 2014-11-17 DIAGNOSIS — Z130 Encounter for screening for diseases of the blood and blood-forming organs and certain disorders involving the immune mechanism: Secondary | ICD-10-CM

## 2014-11-17 DIAGNOSIS — Z0000 Encounter for general adult medical examination without abnormal findings: Secondary | ICD-10-CM

## 2014-11-17 LAB — POCT URINALYSIS DIPSTICK
Bilirubin, UA: NEGATIVE
Blood, UA: NEGATIVE
GLUCOSE UA: NEGATIVE
Ketones, UA: NEGATIVE
Leukocytes, UA: NEGATIVE
Nitrite, UA: NEGATIVE
Protein, UA: NEGATIVE
SPEC GRAV UA: 1.025
UROBILINOGEN UA: 0.2
pH, UA: 5.5

## 2014-11-17 LAB — CBC
HCT: 39.3 % (ref 36.0–46.0)
Hemoglobin: 13.2 g/dL (ref 12.0–15.0)
MCH: 30.1 pg (ref 26.0–34.0)
MCHC: 33.6 g/dL (ref 30.0–36.0)
MCV: 89.7 fL (ref 78.0–100.0)
MPV: 11 fL (ref 9.4–12.4)
PLATELETS: 337 10*3/uL (ref 150–400)
RBC: 4.38 MIL/uL (ref 3.87–5.11)
RDW: 13.2 % (ref 11.5–15.5)
WBC: 6.1 10*3/uL (ref 4.0–10.5)

## 2014-11-17 NOTE — Patient Instructions (Signed)
Loratadine, Benadryl and Allegra are listed as "probably safe" while nursing     Why follow it? Research shows. . Those who follow the Mediterranean diet have a reduced risk of heart disease  . The diet is associated with a reduced incidence of Parkinson's and Alzheimer's diseases . People following the diet may have longer life expectancies and lower rates of chronic diseases  . The Dietary Guidelines for Americans recommends the Mediterranean diet as an eating plan to promote health and prevent disease  What Is the Mediterranean Diet?  . Healthy eating plan based on typical foods and recipes of Mediterranean-style cooking . The diet is primarily a plant based diet; these foods should make up a majority of meals   Starches - Plant based foods should make up a majority of meals - They are an important sources of vitamins, minerals, energy, antioxidants, and fiber - Choose whole grains, foods high in fiber and minimally processed items  - Typical grain sources include wheat, oats, barley, corn, brown rice, bulgar, farro, millet, polenta, couscous  - Various types of beans include chickpeas, lentils, fava beans, black beans, white beans   Fruits  Veggies - Large quantities of antioxidant rich fruits & veggies; 6 or more servings  - Vegetables can be eaten raw or lightly drizzled with oil and cooked  - Vegetables common to the traditional Mediterranean Diet include: artichokes, arugula, beets, broccoli, brussel sprouts, cabbage, carrots, celery, collard greens, cucumbers, eggplant, kale, leeks, lemons, lettuce, mushrooms, okra, onions, peas, peppers, potatoes, pumpkin, radishes, rutabaga, shallots, spinach, sweet potatoes, turnips, zucchini - Fruits common to the Mediterranean Diet include: apples, apricots, avocados, cherries, clementines, dates, figs, grapefruits, grapes, melons, nectarines, oranges, peaches, pears, pomegranates, strawberries, tangerines  Fats - Replace butter and margarine with  healthy oils, such as olive oil, canola oil, and tahini  - Limit nuts to no more than a handful a day  - Nuts include walnuts, almonds, pecans, pistachios, pine nuts  - Limit or avoid candied, honey roasted or heavily salted nuts - Olives are central to the [employer] - can be eaten whole or used in a variety of dishes   Meats Protein - Limiting red meat: no more than a few times a month - When eating red meat: choose lean cuts and keep the portion to the size of deck of cards - Eggs: approx. 0 to 4 times a week  - Fish and lean poultry: at least 2 a week  - Healthy protein sources include, chicken, [place], lean beef, lamb - Increase intake of seafood such as tuna, salmon, trout, mackerel, shrimp, scallops - Avoid or limit high fat processed meats such as sausage and bacon  Dairy - Include moderate amounts of low fat dairy products  - Focus on healthy dairy such as fat free yogurt, skim milk, low or reduced fat cheese - Limit dairy products higher in fat such as whole or 2% milk, cheese, ice cream  Alcohol - Moderate amounts of red wine is ok  - No more than 5 oz daily for women (all ages) and men older than age 11  - No more than 10 oz of wine daily for men younger than 69  Other - Limit sweets and other desserts  - Use herbs and spices instead of salt to flavor foods  - Herbs and spices common to the traditional Mediterranean Diet include: basil, bay leaves, chives, cloves, cumin, fennel, garlic, lavender, marjoram, mint, oregano, parsley, pepper, rosemary, sage, savory, sumac, tarragon, thyme  It's not just a diet, it's a lifestyle:  . The Mediterranean diet includes lifestyle factors typical of those in the region  . Foods, drinks and meals are best eaten with others and savored . Daily physical activity is important for overall good health . This could be strenuous exercise like running and aerobics . This could also be more leisurely activities such as walking, housework,  yard-work, or taking the stairs . Moderation is the key; a balanced and healthy diet accommodates most foods and drinks . Consider portion sizes and frequency of consumption of certain foods   Meal Ideas & Options:  . Breakfast:  o Whole wheat toast or whole wheat English muffins with peanut butter & hard boiled egg o Steel cut oats topped with apples & cinnamon and skim milk  o Fresh fruit: banana, strawberries, melon, berries, peaches  o Smoothies: strawberries, bananas, greek yogurt, peanut butter o Low fat greek yogurt with blueberries and granola  o Egg white omelet with spinach and mushrooms o Breakfast couscous: whole wheat couscous, apricots, skim milk, cranberries  . Sandwiches:  o Hummus and grilled vegetables (peppers, zucchini, squash) on whole wheat bread   o Grilled chicken on whole wheat pita with lettuce, tomatoes, cucumbers or tzatziki  o Tuna salad on whole wheat bread: tuna salad made with greek yogurt, olives, red peppers, capers, green onions o Garlic rosemary lamb pita: lamb sauted with garlic, rosemary, salt & pepper; add lettuce, cucumber, greek yogurt to pita - flavor with lemon juice and black pepper  . Seafood:  o Mediterranean grilled salmon, seasoned with garlic, basil, parsley, lemon juice and black pepper o Shrimp, lemon, and spinach whole-grain pasta salad made with low fat greek yogurt  o Seared scallops with lemon orzo  o Seared tuna steaks seasoned salt, pepper, coriander topped with tomato mixture of olives, tomatoes, olive oil, minced garlic, parsley, green onions and cappers  . Meats:  o Herbed greek chicken salad with kalamata olives, cucumber, feta  o Red bell peppers stuffed with spinach, bulgur, lean ground beef (or lentils) & topped with feta   o Kebabs: skewers of chicken, tomatoes, onions, zucchini, squash  o [place] burgers: made with red onions, mint, dill, lemon juice, feta cheese topped with roasted red peppers . Vegetarian o Cucumber  salad: cucumbers, artichoke hearts, celery, red onion, feta cheese, tossed in olive oil & lemon juice  o Hummus and whole grain pita points with a greek salad (lettuce, tomato, feta, olives, cucumbers, red onion) o Lentil soup with celery, carrots made with vegetable broth, garlic, salt and pepper  o Tabouli salad: parsley, bulgur, mint, scallions, cucumbers, tomato, radishes, lemon juice, olive oil, salt and pepper.

## 2014-11-17 NOTE — Progress Notes (Signed)
Subjective:    Patient ID: Evelyn [name] Section, female    DOB: 06/19/1986, 28 y.o.   MRN: [id number]  HPI Patient presents today for CPE. She has her 34 month old daughter with her today. She has 3 boys as well. She is married.  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 When she is not pregnant or breastfeeding, she takes as needed for outbreaks. Has not had an outbreak in over a year. Currently using Depo provera for contraception. Has been having some spotting.  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 She is currently awaiting scheduling of surgery. She is planning on a BTL at the same time.   She was taking Zoloft for post partum depression, but she stopped this due to over somnolence. She struggled with anxiety and depression while her daughter was in the NICU (born at 51 weeks), but her mood has improved significantly since her daughter has been home. She had severe pre-eclampsia and required emergent cesarean section.   Past Medical History  Diagnosis Date  . HSV (herpes simplex virus) infection   . GERD (gastroesophageal reflux disease)    Past Surgical History  Procedure Laterality Date  . Wisdom tooth extraction    . Hernia repair    . Cesarean section N/A 08/05/2014    Procedure: CESAREAN SECTION;  Surgeon: Jerelyn Charles, MD;  Location: [facility];  Service: Obstetrics;  Laterality: N/A;   Family History  Problem Relation Age of Onset  . Asthma Son    History  Substance Use Topics  . Smoking status: Never Smoker   . Smokeless tobacco: Not on file  . Alcohol Use: No    Review of Systems  Constitutional: Negative.   Eyes: Negative.   Respiratory: Negative.   Cardiovascular: Negative.   Gastrointestinal: Negative.   Endocrine: Negative.   Genitourinary: Negative.   Musculoskeletal: Negative.   Skin: Negative.   Allergic/Immunologic: Negative.     Neurological: Positive for headaches.  Hematological: Bruises/bleeds easily.  Psychiatric/Behavioral: Negative.       Objective:   Physical Exam  Constitutional: She is oriented to person, place, and time. She appears well-developed and well-nourished.  HENT:  Head: Normocephalic and atraumatic.  Right Ear: Tympanic membrane, external ear and ear canal normal.  Left Ear: Tympanic membrane, external ear and ear canal normal.  Nose: Nose normal.  Mouth/Throat: Oropharynx is clear and moist.  Eyes: Conjunctivae are normal. Pupils are equal, round, and reactive to light.  Neck: Normal range of motion. Neck supple. Thyromegaly present.  Cardiovascular: Normal rate, regular rhythm and normal heart sounds.   Pulmonary/Chest: Effort normal and breath sounds normal.  Abdominal: Soft. Bowel sounds are normal.  Musculoskeletal: Normal range of motion. She exhibits no edema or tenderness.  Lymphadenopathy:    She has no cervical adenopathy.  Neurological: She is alert and oriented to person, place, and time. She has normal reflexes.  Skin: Skin is warm and dry.  Psychiatric: She has a normal mood and affect. Her behavior is normal. Judgment and thought content normal.  Vitals reviewed. BP 100/60 mmHg  Pulse 68  Temp(Src) 98.1 F (36.7 C) (Oral)  Resp 16  Ht 5' 1.5" (1.562 m)  Wt 117 lb 9.6 oz (53.343 kg)  BMI 21.86 kg/m2  SpO2 99%     Assessment & Plan:  1. Annual physical exam  2. Elevated LFTs - Comprehensive metabolic panel -  TSH  3. Screening for deficiency anemia - CBC  4. Screening - POCT urinalysis dipstick  5. HSV-2 infection - valACYclovir (VALTREX) 500 MG tablet; Take 1 tablet (500 mg total) by mouth daily.  Dispense: 30 tablet; Refill: [place], FNP-BC  [facility]  11/18/2014 4:44 PM

## 2014-11-17 NOTE — Progress Notes (Signed)
   Subjective:    Patient ID: Evelyn [name], female    DOB: Dec 03, 1986, 28 y.o.   MRN: [id number]  HPI    Review of Systems  Constitutional: Negative.   Eyes: Negative.   Respiratory: Negative.   Cardiovascular: Negative.   Gastrointestinal: Negative.   Endocrine: Negative.   Genitourinary: Negative.   Musculoskeletal: Negative.   Allergic/Immunologic: Negative.   Neurological: Positive for headaches.  Hematological: Bruises/bleeds easily.  Psychiatric/Behavioral: Negative.        Objective:   Physical Exam        Assessment & Plan:

## 2014-11-18 ENCOUNTER — Encounter: Payer: Self-pay | Admitting: Family Medicine

## 2014-11-18 LAB — COMPREHENSIVE METABOLIC PANEL
ALK PHOS: 151 U/L — AB (ref 39–117)
ALT: 15 U/L (ref 0–35)
AST: 17 U/L (ref 0–37)
Albumin: 4.9 g/dL (ref 3.5–5.2)
BUN: 20 mg/dL (ref 6–23)
CO2: 23 mEq/L (ref 19–32)
Calcium: 10.3 mg/dL (ref 8.4–10.5)
Chloride: 105 mEq/L (ref 96–112)
Creat: 0.71 mg/dL (ref 0.50–1.10)
GLUCOSE: 82 mg/dL (ref 70–99)
Potassium: 4.3 mEq/L (ref 3.5–5.3)
Sodium: 137 mEq/L (ref 135–145)
Total Bilirubin: 0.3 mg/dL (ref 0.2–1.2)
Total Protein: 8.2 g/dL (ref 6.0–8.3)

## 2014-11-18 LAB — TSH: TSH: 0.901 u[IU]/mL (ref 0.350–4.500)

## 2014-11-18 LAB — CYTOLOGY - PAP

## 2014-11-18 MED ORDER — VALACYCLOVIR HCL 500 MG PO TABS: 500.0000 mg | ORAL_TABLET | Freq: Every day | ORAL | Status: DC

## 2014-11-19 ENCOUNTER — Other Ambulatory Visit: Payer: Self-pay | Admitting: Family Medicine

## 2014-11-19 DIAGNOSIS — R748 Abnormal levels of other serum enzymes: Secondary | ICD-10-CM

## 2015-01-03 ENCOUNTER — Ambulatory Visit (HOSPITAL_COMMUNITY): Admission: RE | Admit: 2015-01-03 | Payer: Medicaid Other | Source: Ambulatory Visit

## 2015-01-07 ENCOUNTER — Ambulatory Visit (HOSPITAL_COMMUNITY): Payer: Medicaid Other

## 2015-06-14 ENCOUNTER — Ambulatory Visit: Payer: Self-pay | Admitting: Family Medicine

## 2015-08-02 ENCOUNTER — Ambulatory Visit: Payer: Self-pay | Admitting: Family Medicine

## 2015-08-13 IMAGING — US US ABDOMEN LIMITED
1 series · 14 of 25 positions shown · non-contrast
Comparison: CT 10/09/2011

CLINICAL DATA: Abnormal liver function tests. Three days
postpartum.

EXAM:
US ABDOMEN LIMITED - RIGHT UPPER QUADRANT

[Series 1: us abdomen limited · 0.20mm/px · 14 of 50 slices shown]
[im 1/50]
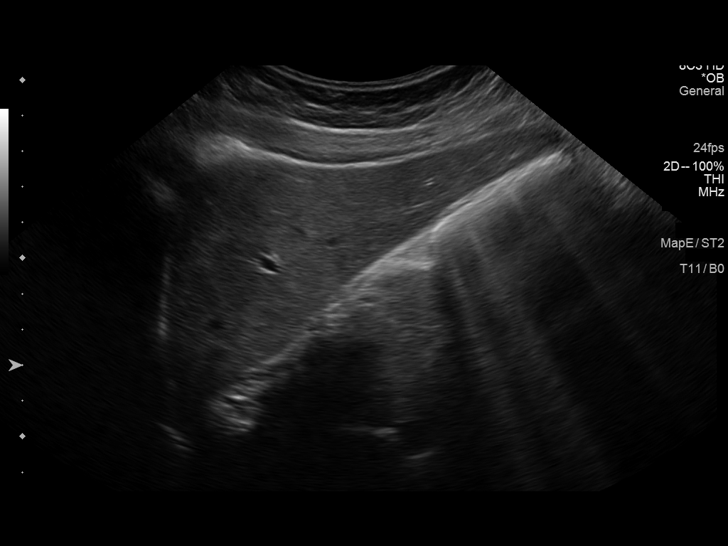
[im 5/50]
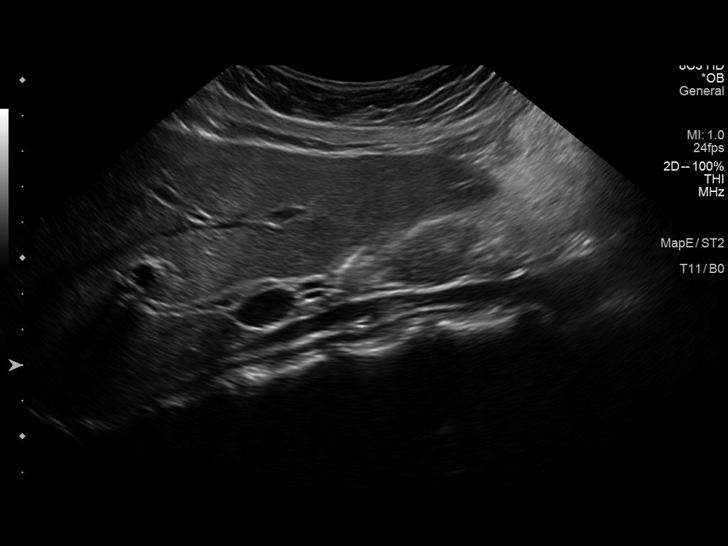
[im 9/50]
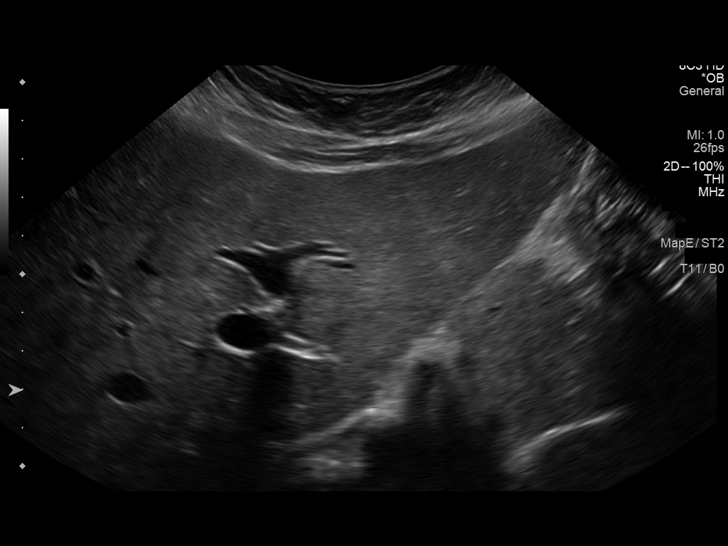
[im 13/50]
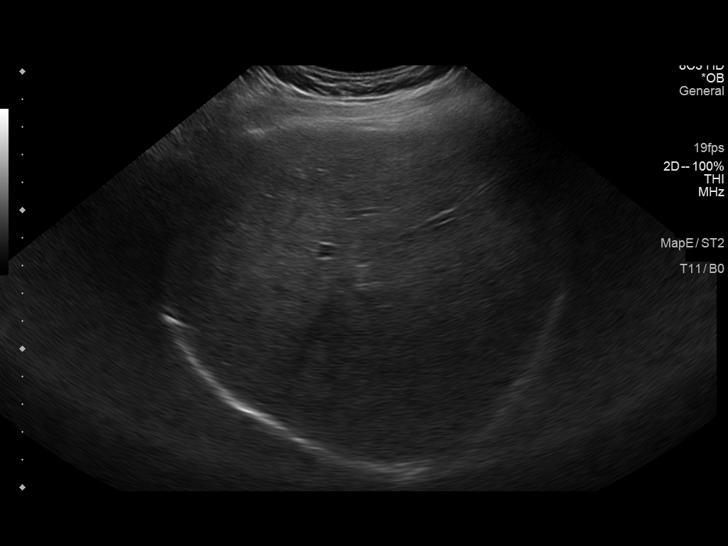
[im 17/50]
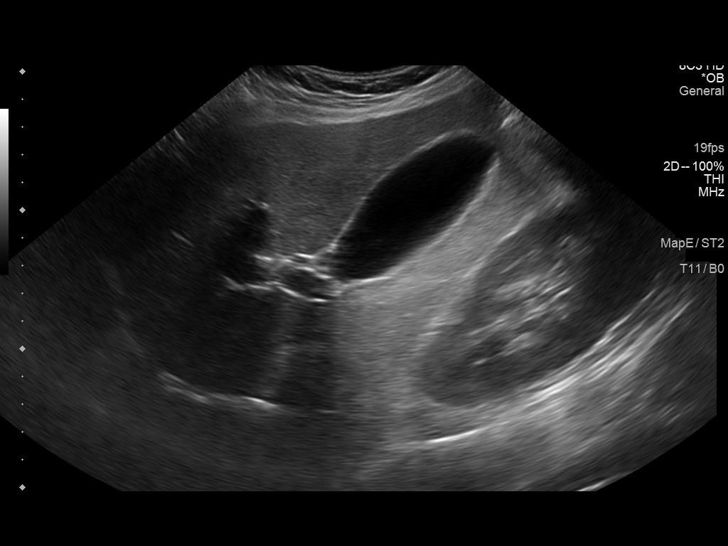
[im 19/50]
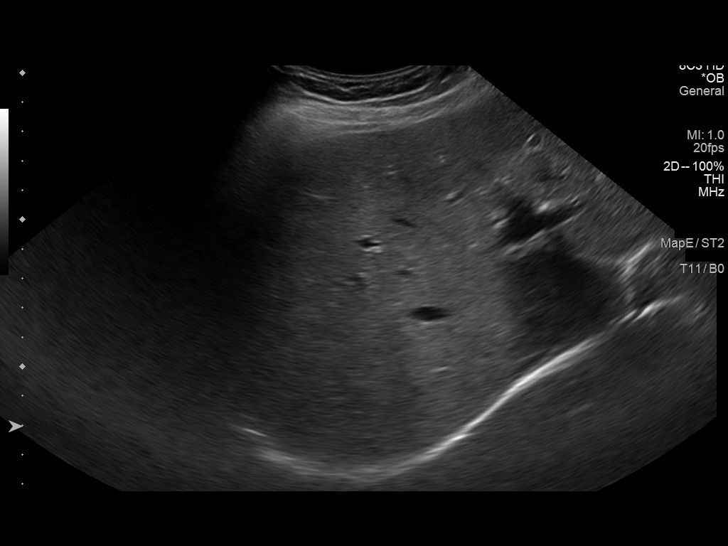
[im 23/50]
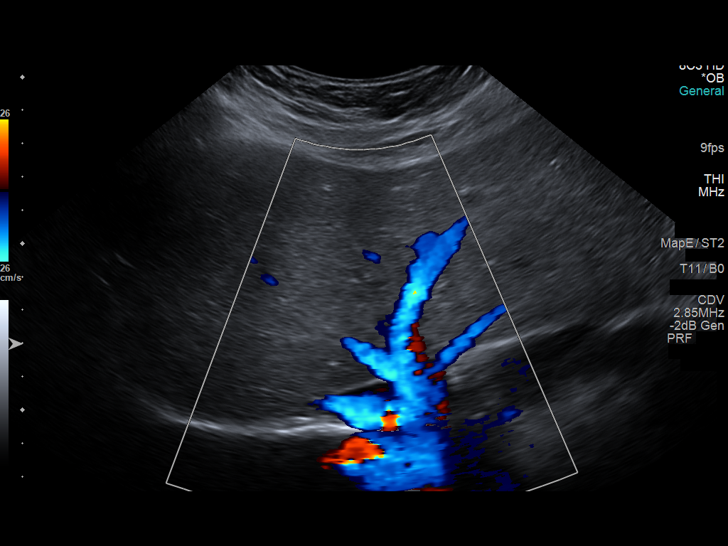
[im 27/50]
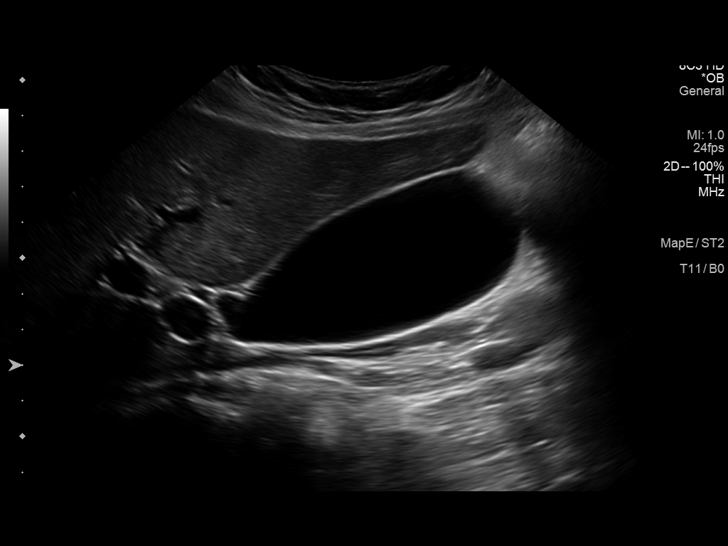
[im 31/50]
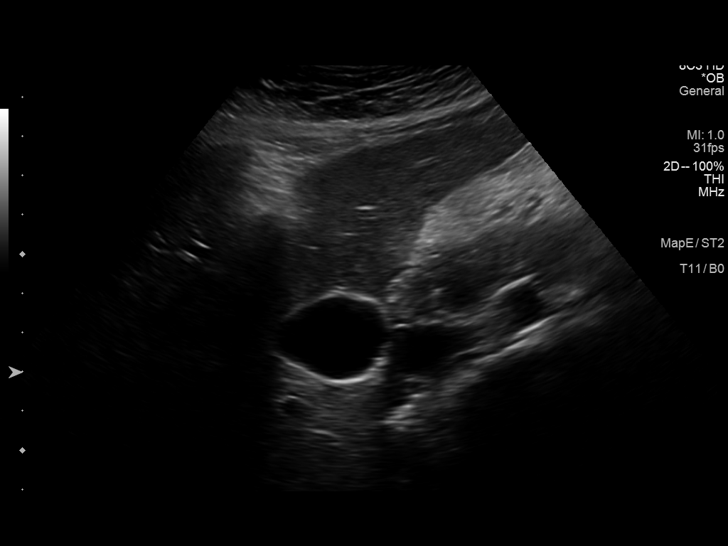
[im 33/50]
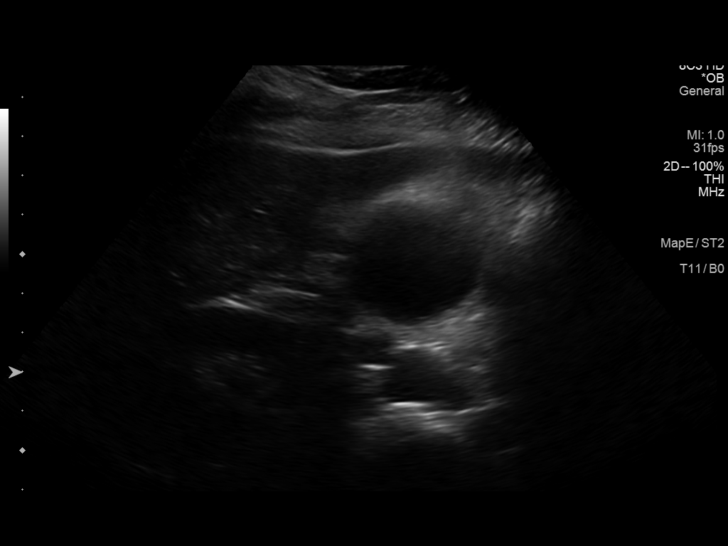
[im 37/50]
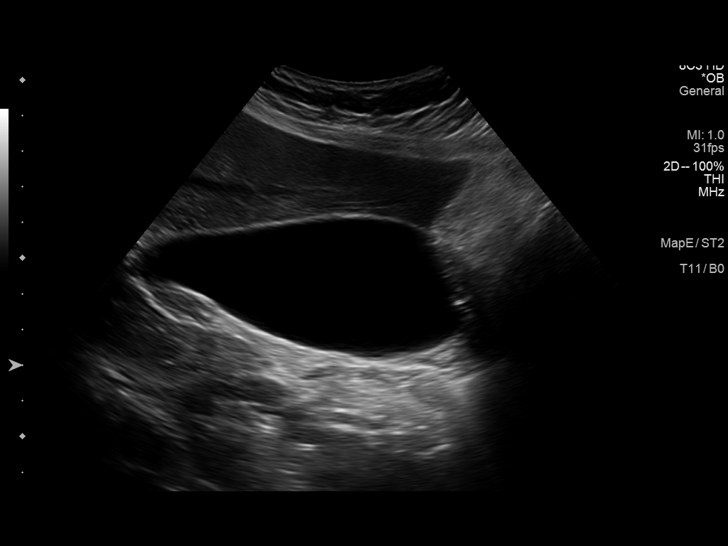
[im 41/50]
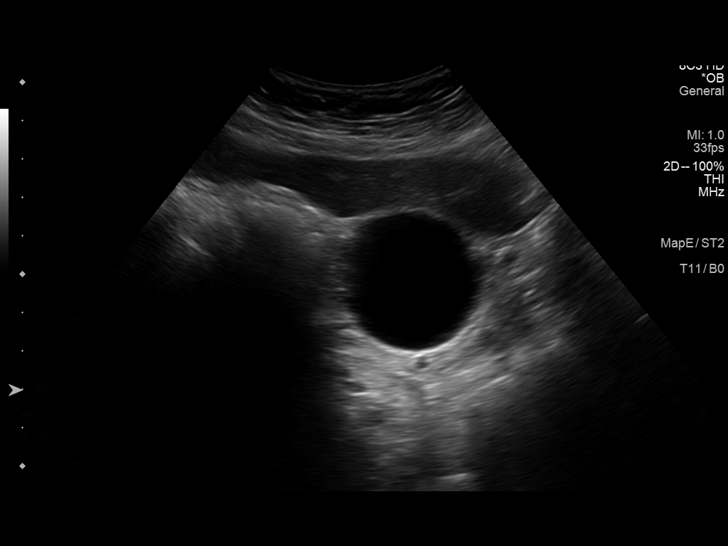
[im 45/50]
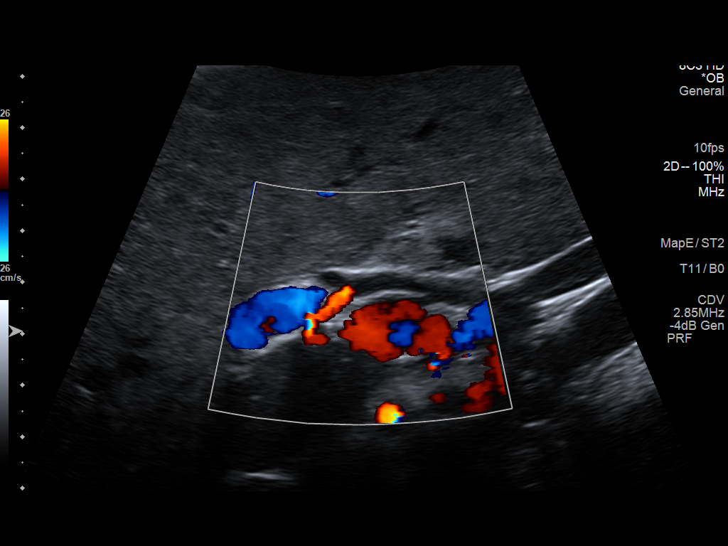
[im 50/50]
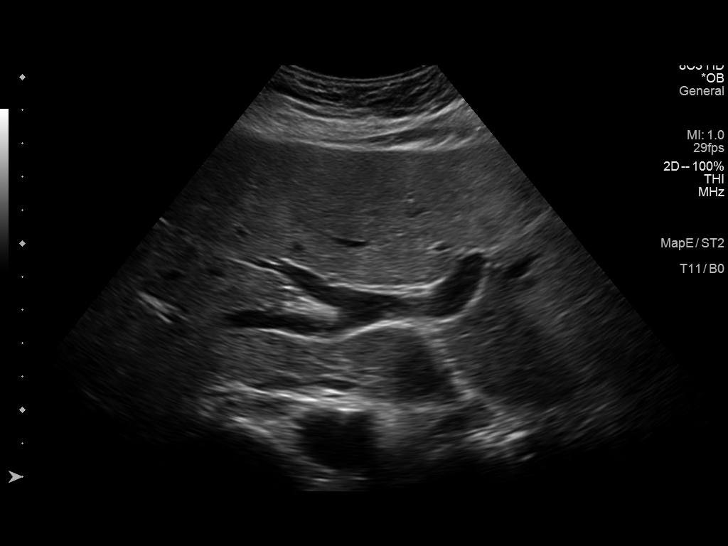

[14 of 25 positions shown; findings below may reference images not displayed]

FINDINGS: Gallbladder:

No gallstones or wall thickening visualized. No sonographic Murphy
sign noted.

Common bile duct:

Diameter: Normal, 4 mm.

Liver:

No focal lesion identified. Within normal limits in parenchymal
echogenicity.
IMPRESSION: Normal right upper quadrant ultrasound.

## 2015-10-07 ENCOUNTER — Encounter: Payer: Self-pay | Admitting: *Deleted

## 2015-10-07 ENCOUNTER — Ambulatory Visit (INDEPENDENT_AMBULATORY_CARE_PROVIDER_SITE_OTHER): Payer: Medicaid Other | Admitting: Obstetrics & Gynecology

## 2015-10-07 ENCOUNTER — Encounter: Payer: Self-pay | Admitting: Obstetrics & Gynecology

## 2015-10-07 VITALS — BP 120/58 | HR 61 | Temp 98.4°F | Ht 61.0 in | Wt 137.9 lb

## 2015-10-07 DIAGNOSIS — A609 Anogenital herpesviral infection, unspecified: Secondary | ICD-10-CM | POA: Diagnosis not present

## 2015-10-07 DIAGNOSIS — R32 Unspecified urinary incontinence: Secondary | ICD-10-CM | POA: Diagnosis not present

## 2015-10-07 DIAGNOSIS — A6009 Herpesviral infection of other urogenital tract: Secondary | ICD-10-CM

## 2015-10-07 DIAGNOSIS — Z3040 Encounter for surveillance of contraceptives, unspecified: Secondary | ICD-10-CM | POA: Diagnosis not present

## 2015-10-07 MED ORDER — VALACYCLOVIR HCL 1 G PO TABS: 2000.0000 mg | ORAL_TABLET | Freq: Two times a day (BID) | ORAL | Status: AC

## 2015-10-07 NOTE — Progress Notes (Signed)
   Subjective:    Patient ID: Evelyn [name], female    DOB: Apr 01, 1986, 29 y.o.   MRN: [id number]  HPI  29 yo MH P4 (69, 7, 36, 34 yo kids) here today for several reasons.  1) She currently uses depo provera and is interested in having permanent sterility.  She was seeing Dr. Freda Munro who had planned to do a sterilization procedure along with a "bladder lift".  2) She complains of worsening GSUI, wears pads daily  3) She has been having "burning" with sex after their most recent sex encounter. She has herpes, ran out of acyclovir   Review of Systems Her husband travels a lot. She has a mesh for umbilical hernia repair.    Objective:   Physical Exam [id number] Breathing, conversing, and  Abd -benign EG- shaved, no lesions +Qtip test     Assessment & Plan:  GSUI- plan for sling. She is aware that 5% of women with a sling develop urge incontinence. Burning after sex recently. Rec restart antivirals. I prescribed valtrex. Desire for sterility. She will sign her medicaid forms today and I will plan to do a lap BS

## 2015-10-11 ENCOUNTER — Emergency Department (HOSPITAL_COMMUNITY)
Admission: EM | Admit: 2015-10-11 | Discharge: 2015-10-11 | Disposition: A | Discharge status: Home / Self Care | Payer: Medicaid Other | Source: Home / Self Care

## 2015-10-11 ENCOUNTER — Encounter (HOSPITAL_COMMUNITY): Payer: Self-pay | Admitting: *Deleted

## 2015-10-11 DIAGNOSIS — M25539 Pain in unspecified wrist: Secondary | ICD-10-CM

## 2015-10-11 DIAGNOSIS — G8929 Other chronic pain: Secondary | ICD-10-CM | POA: Diagnosis not present

## 2015-10-11 NOTE — ED Notes (Signed)
Pt  Advised  By  This  Writer  As    Well as  Electronics engineer member  Maggie  That  She  Needed    To see     Her  pcp     On there medicaid card  To  Get a  referrell   To a  Museum/gallery curator

## 2015-10-11 NOTE — ED Notes (Signed)
Pt  Reports   r   Arm   Pain  From  Carpal  Tunnel  Syndrome  In  Past    -  She reports        Symptoms  Began   approx  5  Years  Ago    denys  Any  specefic  injury

## 2015-10-11 NOTE — Discharge Instructions (Signed)
Carpal Tunnel Syndrome Carpal tunnel syndrome is a condition that causes pain in your hand and arm. The carpal tunnel is a narrow area that is on the palm side of your wrist. Repeated wrist motion or certain diseases may cause swelling in the tunnel. This swelling can pinch the main nerve in the wrist (median nerve).  HOME CARE If You Have a Splint:  Wear it as told by your doctor. Remove it only as told by your doctor.  Loosen the splint if your fingers:  Become numb and tingle.  Turn blue and cold.  Keep the splint clean and dry. General Instructions  Take over-the-counter and prescription medicines only as told by your doctor.  Rest your wrist from any activity that may be causing your pain. If needed, talk to your employer about changes that can be made in your work, such as getting a wrist pad to use while typing.  If directed, apply ice to the painful area:  Put ice in a plastic bag.  Place a towel between your skin and the bag.  Leave the ice on for 20 minutes, 2-3 times per day.  Keep all follow-up visits as told by your doctor. This is important.  Do any exercises as told by your doctor, physical therapist, or occupational therapist. GET HELP IF:  You have new symptoms.  Medicine does not help your pain.  Your symptoms get worse.   This information is not intended to replace advice given to you by your health care provider. Make sure you discuss any questions you have with your health care provider.   Document Released: 11/08/2011 Document Revised: 08/10/2015 Document Reviewed: 04/06/2015 Elsevier Interactive Patient Education 2016 [employer]  

## 2015-10-13 ENCOUNTER — Encounter (HOSPITAL_COMMUNITY): Payer: Self-pay | Admitting: *Deleted

## 2015-12-03 ENCOUNTER — Emergency Department (HOSPITAL_COMMUNITY)
Admission: EM | Admit: 2015-12-03 | Discharge: 2015-12-03 | Disposition: A | Discharge status: Home / Self Care | Payer: Medicaid Other | Source: Home / Self Care

## 2015-12-03 ENCOUNTER — Encounter (HOSPITAL_COMMUNITY): Payer: Self-pay | Admitting: Emergency Medicine

## 2015-12-03 DIAGNOSIS — Z2089 Contact with and (suspected) exposure to other communicable diseases: Secondary | ICD-10-CM | POA: Diagnosis not present

## 2015-12-03 DIAGNOSIS — Z20818 Contact with and (suspected) exposure to other bacterial communicable diseases: Secondary | ICD-10-CM

## 2015-12-03 LAB — POCT RAPID STREP A: Streptococcus, Group A Screen (Direct): NEGATIVE

## 2015-12-03 MED ORDER — AMOXICILLIN 500 MG PO CAPS
500.0000 mg | ORAL_CAPSULE | Freq: Three times a day (TID) | ORAL | Status: DC
Start: 2015-12-03 — End: 2016-02-20

## 2015-12-03 MED ORDER — BENZONATATE 100 MG PO CAPS: 100.0000 mg | ORAL_CAPSULE | Freq: Three times a day (TID) | ORAL | Status: DC

## 2015-12-03 NOTE — ED Notes (Signed)
The patient presented to the [facility] with a complaint of a sore throat and a cough that started last night. The patient reported that she has 4 children in the home that are currently being treated for strep.

## 2015-12-03 NOTE — Discharge Instructions (Signed)
Medidas de precaucin para evitar el contagio a travs del aire Pensions consultant) Los microbios que se propagan a travs del aire se llaman bacterias de transmisin area. Las medidas de precaucin para evitar el contagio a travs del aire son normas que ayudan a Product/process development scientist que esas bacterias se transmitan de Ardelia Mems persona a Theatre manager. NORMAS PARA LOS PACIENTES  Lo tratarn en una habitacin privada. Mantenga cerrada la puerta de esta habitacin. Consulte al personal de enfermera antes de salir de la habitacin.  Use una mascarilla para ir a otra zona del hospital o la clnica. Asegrese de que la mascarilla est bien [place].  Cbrase la boca con un pauelo de papel cuando tosa.  Cbrase la nariz con un pauelo de papel cuando estornude.  Lvese las manos con frecuencia. NORMAS PARA LOS VISITANTES  Consulte al personal de enfermera antes de ingresar a una habitacin que tiene un cartel que dice "Medidas de precaucin para evitar el contagio a travs del aire".  Si un enfermero lo autoriza a Web designer, [employer] y use una mascarilla que le cubra la nariz y la boca. Asegrese de que la mascarilla est bien [place].  No se quite la mascarilla en la habitacin.  No ingiera alimentos ni bebidas en la habitacin.  No use ni toque ningn elemento de la habitacin a menos que consulte primero a Public relations account executive.  Inmediatamente despus de salir de la habitacin, qutese la mascarilla y deschela en la basura. Luego, lvese las manos.   Esta informacin no tiene Marine scientist el consejo del mdico. Asegrese de hacerle al mdico cualquier pregunta que tenga.   Document Released: 12/22/2010 Document Revised: 04/05/2015 Elsevier Interactive Patient Education 2016 [place] (Strep Throat) La faringitis estreptoccica es una infeccin que se produce en la garganta y [place] son las bacterias. Esta enfermedad se transmite de [place] persona a otra a travs de  la tos, el estornudo o el contacto cercano. [place] los medicamentos de venta libre y los recetados solamente como se lo haya indicado el Elaine antibitico como se lo indic su mdico. No deje de tomar los medicamentos aunque comience a Sports administrator.  Si otros miembros de la familia tambin tienen dolor de garganta o fiebre, deben ir al mdico. Comida y bebida  No comparta los alimentos, las tazas ni los artculos personales.  Intente consumir alimentos blandos hasta que el dolor de garganta mejore.  Beba suficiente lquido para mantener el pis (orina) claro o de color amarillo plido. Instrucciones generales  Enjuguese la boca (haga grgaras) con [employer] de agua con sal 3 o 4veces al da, o cuando sea necesario. Para preparar la mezcla de agua y sal, disuelva de media a 1cucharadita de sal en 1taza de agua tibia.  Asegrese de que todas las personas que viven en su casa se laven [employer].  Reposo.  No concurra a la escuela o al Ali Lowe que haya tomado los antibiticos durante 24horas.  Concurra a todas las visitas de control como se lo haya indicado el mdico. Esto es importante. SOLICITE AYUDA SI:  El cuello est cada vez ms hinchado.  Le aparece una erupcin cutnea, tos o dolor de odos.  Tose y expectora un lquido espeso de color verde o amarillo amarronado, o con English.  El dolor no mejora con medicamentos.  Los problemas empeoran en vez de Teacher, English as a foreign language.  Tiene fiebre. SOLICITE AYUDA DE INMEDIATO SI:  Vomita.  Siente un dolor de cabeza muy intenso.  Le duele el cuello o siente que est rgido.  Siente dolor en el pecho o le falta el aire.  Tiene dolor de garganta intenso, babea o tiene cambios en la voz.  Tiene el cuello hinchado o la piel est enrojecida y sensible.  Tiene la boca seca u orina menos de lo normal.  Est cada vez ms cansado o le resulta difcil despertarse.  [place]  articulaciones o estn enrojecidas.   Esta informacin no tiene Marine scientist el consejo del mdico. Asegrese de hacerle al mdico cualquier pregunta que tenga.   Document Released: 02/15/2009 Document Revised: 08/10/2015 Elsevier Interactive Patient Education [employer].

## 2015-12-04 HISTORY — PX: CARPAL TUNNEL RELEASE: SHX101

## 2015-12-04 NOTE — ED Provider Notes (Signed)
CSN: [id number]     Arrival date & time 12/03/15  1213 History   None    Chief Complaint  Patient presents with  . Sore Throat  . Cough   (Consider location/radiation/quality/duration/timing/severity/associated sxs/prior Treatment) HPI 4 kids in home with documented strep, feeling a bit ill at this time. No fever, scratchy throat Past Medical History  Diagnosis Date  . HSV (herpes simplex virus) infection   . GERD (gastroesophageal reflux disease)   . Allergy   . Depression     post partum   Past Surgical History  Procedure Laterality Date  . Wisdom tooth extraction    . Cesarean section N/A 08/05/2014    Procedure: CESAREAN SECTION;  Surgeon: Jerelyn Charles, MD;  Location: [facility];  Service: Obstetrics;  Laterality: N/A;  . Hernia repair  2011   Family History  Problem Relation Age of Onset  . Asthma Son    Social History  Substance Use Topics  . Smoking status: Never Smoker   . Smokeless tobacco: None  . Alcohol Use: No   OB History    Gravida Para Term Preterm AB TAB SAB Ectopic Multiple Living   4 4 2 2  0 0 0 0 0 3     Review of Systems ROS +'ve URI symptoms, strep exposure  Denies: HEADACHE, NAUSEA, ABDOMINAL PAIN, CHEST PAIN, CONGESTION, DYSURIA, SHORTNESS OF BREATH  Allergies  Review of patient's allergies indicates no known allergies.  Home Medications   Prior to Admission medications   Medication Sig Start Date End Date Taking? Authorizing Provider  valACYclovir (VALTREX) 500 MG tablet Take 1 tablet (500 mg total) by mouth daily. 11/18/14  Yes Elby Beck, FNP  amoxicillin (AMOXIL) 500 MG capsule Take 1 capsule (500 mg total) by mouth 3 (three) times daily. 12/03/15   Konrad Felix, PA  benzonatate (TESSALON) 100 MG capsule Take 1 capsule (100 mg total) by mouth every 8 (eight) hours. 12/03/15   Konrad Felix, PA  Prenatal Vit w/Fe-Methylfol-FA (PNV PO) Take by mouth.    Historical Provider, MD   Meds Ordered and Administered this Visit   Medications - No data to display  BP 109/45 mmHg  Pulse 77  Temp(Src) 98.1 F (36.7 C) (Oral)  Resp 18  SpO2 98% No data found.   Physical Exam NURSES NOTES AND VITAL SIGNS REVIEWED. CONSTITUTIONAL: Well developed, well nourished, no acute distress HEENT: normocephalic, atraumatic, no exudate EYES: Conjunctiva normal NECK:normal ROM, supple PULMONARY:No respiratory distress, normal effort, Lungs: CTAb/l CARDIOVASCULAR: RRR, no murmur ABDOMEN: soft, ND, NT, +'ve BS MUSCULOSKELETAL: Normal ROM of all extremities SKIN: warm and dry without rash PSYCHIATRIC: Mood and affect normal  ED Course  Procedures (including critical care time)  Labs Review Labs Reviewed  CULTURE, GROUP A STREP  POCT RAPID STREP A    Imaging Review No results found.   Visual Acuity Review  Right Eye Distance:   Left Eye Distance:   Bilateral Distance:    Right Eye Near:   Left Eye Near:    Bilateral Near:         MDM   1. Strep throat exposure    Patient is advised to continue home symptomatic treatment. Prescription for  Amoxil, tessalon sent pharmacy patient has indicated. Patient is advised that if there are new or worsening symptoms or attend the emergency department, or contact primary care provider. Instructions of care provided discharged home in stable condition.  THIS NOTE WAS GENERATED USING A VOICE RECOGNITION SOFTWARE PROGRAM. ALL  REASONABLE EFFORTS  WERE MADE TO PROOFREAD THIS DOCUMENT FOR ACCURACY.     Konrad Felix, PA 12/04/15 1012

## 2015-12-05 LAB — CULTURE, GROUP A STREP: Strep A Culture: NEGATIVE

## 2015-12-08 ENCOUNTER — Encounter (HOSPITAL_COMMUNITY): Payer: Self-pay

## 2015-12-12 MED ORDER — DOXYCYCLINE HYCLATE 100 MG IV SOLR
200.0000 mg | INTRAVENOUS | Status: DC
  Filled 2015-12-12: qty 200

## 2015-12-13 ENCOUNTER — Inpatient Hospital Stay (HOSPITAL_COMMUNITY): Payer: Medicaid Other | Admitting: Anesthesiology

## 2015-12-13 ENCOUNTER — Ambulatory Visit (HOSPITAL_COMMUNITY)
Admission: RE | Admit: 2015-12-13 | Discharge: 2015-12-13 | DRG: 743 | Disposition: A | Discharge status: Home / Self Care | Payer: Medicaid Other | Source: Ambulatory Visit | Attending: Obstetrics & Gynecology | Admitting: Obstetrics & Gynecology

## 2015-12-13 ENCOUNTER — Encounter (HOSPITAL_COMMUNITY): Payer: Self-pay | Admitting: *Deleted

## 2015-12-13 ENCOUNTER — Encounter (HOSPITAL_COMMUNITY)
Admission: RE | Disposition: A | Discharge status: Home / Self Care | Payer: Self-pay | Source: Ambulatory Visit | Attending: Obstetrics & Gynecology

## 2015-12-13 DIAGNOSIS — Z302 Encounter for sterilization: Secondary | ICD-10-CM | POA: Diagnosis present

## 2015-12-13 DIAGNOSIS — N393 Stress incontinence (female) (male): Secondary | ICD-10-CM

## 2015-12-13 DIAGNOSIS — K219 Gastro-esophageal reflux disease without esophagitis: Secondary | ICD-10-CM | POA: Diagnosis present

## 2015-12-13 DIAGNOSIS — B009 Herpesviral infection, unspecified: Secondary | ICD-10-CM | POA: Diagnosis not present

## 2015-12-13 DIAGNOSIS — I10 Essential (primary) hypertension: Secondary | ICD-10-CM | POA: Diagnosis present

## 2015-12-13 DIAGNOSIS — Z79899 Other long term (current) drug therapy: Secondary | ICD-10-CM

## 2015-12-13 DIAGNOSIS — R32 Unspecified urinary incontinence: Secondary | ICD-10-CM | POA: Diagnosis present

## 2015-12-13 HISTORY — PX: LAPAROSCOPIC BILATERAL SALPINGECTOMY: SHX5889

## 2015-12-13 HISTORY — PX: PUBOVAGINAL SLING: SHX1035

## 2015-12-13 HISTORY — DX: HELLP syndrome (HELLP), unspecified trimester: O14.20

## 2015-12-13 LAB — PREGNANCY, URINE: Preg Test, Ur: NEGATIVE

## 2015-12-13 LAB — CBC
HEMATOCRIT: 37.8 % (ref 36.0–46.0)
Hemoglobin: 13 g/dL (ref 12.0–15.0)
MCH: 30 pg (ref 26.0–34.0)
MCHC: 34.4 g/dL (ref 30.0–36.0)
MCV: 87.1 fL (ref 78.0–100.0)
Platelets: 305 10*3/uL (ref 150–400)
RBC: 4.34 MIL/uL (ref 3.87–5.11)
RDW: 13 % (ref 11.5–15.5)
WBC: 6.1 10*3/uL (ref 4.0–10.5)

## 2015-12-13 SURGERY — CREATION, PUBOVAGINAL SLING
Anesthesia: General | Site: Vagina

## 2015-12-13 MED ORDER — NEOSTIGMINE METHYLSULFATE 10 MG/10ML IV SOLN
INTRAVENOUS | Status: DC | PRN
  Administered 2015-12-13: 3 mg via INTRAVENOUS

## 2015-12-13 MED ORDER — KETOROLAC TROMETHAMINE 30 MG/ML IJ SOLN
INTRAMUSCULAR | Status: DC | PRN
  Administered 2015-12-13: 30 mg via INTRAVENOUS

## 2015-12-13 MED ORDER — HYDROMORPHONE HCL 1 MG/ML IJ SOLN
0.2500 mg | INTRAMUSCULAR | Status: DC | PRN
  Administered 2015-12-13: 0.5 mg via INTRAVENOUS

## 2015-12-13 MED ORDER — SCOPOLAMINE 1 MG/3DAYS TD PT72
1.0000 | MEDICATED_PATCH | Freq: Once | TRANSDERMAL | Status: DC
  Administered 2015-12-13: 1.5 mg via TRANSDERMAL

## 2015-12-13 MED ORDER — DEXAMETHASONE SODIUM PHOSPHATE 10 MG/ML IJ SOLN
INTRAMUSCULAR | Status: DC | PRN
  Administered 2015-12-13: 10 mg via INTRAVENOUS

## 2015-12-13 MED ORDER — PROPOFOL 10 MG/ML IV BOLUS
INTRAVENOUS | Status: DC | PRN
  Administered 2015-12-13: 150 mg via INTRAVENOUS

## 2015-12-13 MED ORDER — OXYCODONE-ACETAMINOPHEN 5-325 MG PO TABS
1.0000 | ORAL_TABLET | Freq: Once | ORAL | Status: AC
  Administered 2015-12-13: 1 via ORAL

## 2015-12-13 MED ORDER — GLYCOPYRROLATE 0.2 MG/ML IJ SOLN
INTRAMUSCULAR | Status: DC | PRN
  Administered 2015-12-13: 0.6 mg via INTRAVENOUS

## 2015-12-13 MED ORDER — ONDANSETRON HCL 4 MG/2ML IJ SOLN
INTRAMUSCULAR | Status: DC | PRN
  Administered 2015-12-13: 4 mg via INTRAVENOUS

## 2015-12-13 MED ORDER — BUPIVACAINE HCL (PF) 0.5 % IJ SOLN
INTRAMUSCULAR | Status: DC | PRN
  Administered 2015-12-13: 60 mL

## 2015-12-13 MED ORDER — MIDAZOLAM HCL 2 MG/2ML IJ SOLN
INTRAMUSCULAR | Status: DC | PRN
  Administered 2015-12-13: 2 mg via INTRAVENOUS

## 2015-12-13 MED ORDER — OXYCODONE-ACETAMINOPHEN 5-325 MG PO TABS
ORAL_TABLET | ORAL | Status: AC
  Filled 2015-12-13: qty 1

## 2015-12-13 MED ORDER — HYDROMORPHONE HCL 1 MG/ML IJ SOLN
INTRAMUSCULAR | Status: AC
  Administered 2015-12-13: 0.5 mg via INTRAVENOUS
  Filled 2015-12-13: qty 1

## 2015-12-13 MED ORDER — GLYCOPYRROLATE 0.2 MG/ML IJ SOLN
INTRAMUSCULAR | Status: AC
  Filled 2015-12-13: qty 3

## 2015-12-13 MED ORDER — DOXYCYCLINE HYCLATE 100 MG IV SOLR
200.0000 mg | INTRAVENOUS | Status: AC
  Administered 2015-12-13: 200 mg via INTRAVENOUS
  Filled 2015-12-13: qty 200

## 2015-12-13 MED ORDER — OXYCODONE-ACETAMINOPHEN 5-325 MG PO TABS: 1.0000 | ORAL_TABLET | Freq: Four times a day (QID) | ORAL | Status: DC | PRN

## 2015-12-13 MED ORDER — LACTATED RINGERS IV SOLN
INTRAVENOUS | Status: DC
  Administered 2015-12-13 (×2): via INTRAVENOUS

## 2015-12-13 MED ORDER — NEOSTIGMINE METHYLSULFATE 10 MG/10ML IV SOLN
INTRAVENOUS | Status: AC
  Filled 2015-12-13: qty 1

## 2015-12-13 MED ORDER — LIDOCAINE HCL (CARDIAC) 20 MG/ML IV SOLN
INTRAVENOUS | Status: AC
  Filled 2015-12-13: qty 5

## 2015-12-13 MED ORDER — MIDAZOLAM HCL 2 MG/2ML IJ SOLN
INTRAMUSCULAR | Status: AC
  Filled 2015-12-13: qty 2

## 2015-12-13 MED ORDER — STERILE WATER FOR IRRIGATION IR SOLN
Status: DC | PRN
  Administered 2015-12-13: 1000 mL

## 2015-12-13 MED ORDER — SCOPOLAMINE 1 MG/3DAYS TD PT72
MEDICATED_PATCH | TRANSDERMAL | Status: AC
  Administered 2015-12-13: 1.5 mg via TRANSDERMAL
  Filled 2015-12-13: qty 1

## 2015-12-13 MED ORDER — IBUPROFEN 600 MG PO TABS: 600.0000 mg | ORAL_TABLET | Freq: Four times a day (QID) | ORAL | Status: DC | PRN

## 2015-12-13 MED ORDER — FENTANYL CITRATE (PF) 250 MCG/5ML IJ SOLN
INTRAMUSCULAR | Status: AC
  Filled 2015-12-13: qty 5

## 2015-12-13 MED ORDER — LIDOCAINE HCL (CARDIAC) 20 MG/ML IV SOLN
INTRAVENOUS | Status: DC | PRN
  Administered 2015-12-13: 100 mg via INTRAVENOUS

## 2015-12-13 MED ORDER — ROCURONIUM BROMIDE 100 MG/10ML IV SOLN
INTRAVENOUS | Status: DC | PRN
  Administered 2015-12-13: 30 mg via INTRAVENOUS

## 2015-12-13 MED ORDER — BUPIVACAINE HCL (PF) 0.5 % IJ SOLN
INTRAMUSCULAR | Status: AC
  Filled 2015-12-13: qty 30

## 2015-12-13 MED ORDER — PROPOFOL 10 MG/ML IV BOLUS
INTRAVENOUS | Status: AC
  Filled 2015-12-13: qty 20

## 2015-12-13 MED ORDER — ONDANSETRON HCL 4 MG/2ML IJ SOLN
INTRAMUSCULAR | Status: AC
  Filled 2015-12-13: qty 2

## 2015-12-13 MED ORDER — OXYCODONE-ACETAMINOPHEN 5-325 MG PO TABS
1.0000 | ORAL_TABLET | ORAL | Status: DC | PRN
  Administered 2015-12-13: 1 via ORAL

## 2015-12-13 MED ORDER — FENTANYL CITRATE (PF) 100 MCG/2ML IJ SOLN
INTRAMUSCULAR | Status: DC | PRN
  Administered 2015-12-13: 100 ug via INTRAVENOUS
  Administered 2015-12-13: 50 ug via INTRAVENOUS
  Administered 2015-12-13: 10 ug via INTRAVENOUS

## 2015-12-13 MED ORDER — SODIUM CHLORIDE 0.9 % IJ SOLN
INTRAMUSCULAR | Status: AC
  Filled 2015-12-13: qty 3

## 2015-12-13 SURGICAL SUPPLY — 46 items
APPLICATOR COTTON TIP 6IN STRL (MISCELLANEOUS) IMPLANT
BLADE SURG 11 STRL SS (BLADE) ×4 IMPLANT
BLADE SURG 15 STRL LF C SS BP (BLADE) ×2 IMPLANT
BLADE SURG 15 STRL SS (BLADE) ×3
CANISTER SUCT 3000ML (MISCELLANEOUS) ×4 IMPLANT
CATH FOLEY 2WAY SLVR  5CC 18FR (CATHETERS) ×2
CATH FOLEY 2WAY SLVR 5CC 18FR (CATHETERS) ×2 IMPLANT
CATH ROBINSON RED A/P 16FR (CATHETERS) ×8 IMPLANT
CLOTH BEACON ORANGE TIMEOUT ST (SAFETY) ×4 IMPLANT
DECANTER SPIKE VIAL GLASS SM (MISCELLANEOUS) IMPLANT
DRSG COVADERM PLUS 2X2 (GAUZE/BANDAGES/DRESSINGS) IMPLANT
DRSG OPSITE POSTOP 3X4 (GAUZE/BANDAGES/DRESSINGS) ×4 IMPLANT
DURAPREP 26ML APPLICATOR (WOUND CARE) ×4 IMPLANT
GLOVE BIO SURGEON STRL SZ 6.5 (GLOVE) ×3 IMPLANT
GLOVE BIO SURGEONS STRL SZ 6.5 (GLOVE) ×1
GLOVE BIOGEL PI IND STRL 6.5 (GLOVE) ×2 IMPLANT
GLOVE BIOGEL PI IND STRL 7.0 (GLOVE) ×2 IMPLANT
GLOVE BIOGEL PI INDICATOR 6.5 (GLOVE) ×2
GLOVE BIOGEL PI INDICATOR 7.0 (GLOVE) ×2
GOWN STRL REUS W/TWL LRG LVL3 (GOWN DISPOSABLE) ×16 IMPLANT
HEMOSTAT SURGICEL 2X3 (HEMOSTASIS) IMPLANT
LIQUID BAND (GAUZE/BANDAGES/DRESSINGS) ×4 IMPLANT
NDL SAFETY ECLIPSE 18X1.5 (NEEDLE) ×2 IMPLANT
NEEDLE HYPO 18GX1.5 SHARP (NEEDLE) ×3
NEEDLE INSUFFLATION 120MM (ENDOMECHANICALS) ×4 IMPLANT
NEEDLE SPNL 18GX3.5 QUINCKE PK (NEEDLE) ×4 IMPLANT
NS IRRIG 1000ML POUR BTL (IV SOLUTION) ×4 IMPLANT
PACK LAPAROSCOPY BASIN (CUSTOM PROCEDURE TRAY) ×4 IMPLANT
PACK VAGINAL WOMENS (CUSTOM PROCEDURE TRAY) IMPLANT
PAD POSITIONING PINK XL (MISCELLANEOUS) ×4 IMPLANT
SET CYSTO W/LG BORE CLAMP LF (SET/KITS/TRAYS/PACK) ×4 IMPLANT
SHEARS HARMONIC ACE PLUS 36CM (ENDOMECHANICALS) ×4 IMPLANT
SLING TVT EXACT (Sling) ×4 IMPLANT
SUT VIC AB 2-0 SH 27 (SUTURE)
SUT VIC AB 2-0 SH 27XBRD (SUTURE) IMPLANT
SUT VIC AB 4-0 SH 27 (SUTURE) ×4
SUT VIC AB 4-0 SH 27XANBCTRL (SUTURE) ×4 IMPLANT
SUT VICRYL 0 UR6 27IN ABS (SUTURE) ×4 IMPLANT
SUT VICRYL 4-0 PS2 18IN ABS (SUTURE) IMPLANT
SYR 30ML LL (SYRINGE) ×4 IMPLANT
TOWEL OR 17X24 6PK STRL BLUE (TOWEL DISPOSABLE) ×8 IMPLANT
TRAY FOLEY CATH SILVER 14FR (SET/KITS/TRAYS/PACK) IMPLANT
TROCAR OPTI TIP 5M 100M (ENDOMECHANICALS) ×8 IMPLANT
TROCAR XCEL DIL TIP R 11M (ENDOMECHANICALS) ×4 IMPLANT
WARMER LAPAROSCOPE (MISCELLANEOUS) ×4 IMPLANT
WATER STERILE IRR 1000ML POUR (IV SOLUTION) IMPLANT

## 2015-12-13 NOTE — Op Note (Signed)
12/13/2015  10:27 AM  PATIENT:  Evelyn [name]  30 y.o. 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  POST-OPERATIVE DIAGNOSIS:  Genuine Stress Urinary Incontinence and undesired  PROCEDURE:  Procedure(s): PUBO-VAGINAL SLING (N/A) LAPAROSCOPIC BILATERAL SALPINGECTOMY (N/A)  CYSTOSCOPY  SURGEON:  Surgeon(s) and Role:    * Emily Filbert, MD - Primary    * Lavonia Drafts, MD - Assisting   ANESTHESIA:   general  EBL:  Total I/O In: 1000 [I.V.:1000] Out: 15 [Urine:15]  BLOOD ADMINISTERED:none  DRAINS: none   LOCAL MEDICATIONS USED:  MARCAINE     SPECIMEN:  Source of Specimen:  both oviducts  DISPOSITION OF SPECIMEN:  PATHOLOGY  COUNTS:  YES  TOURNIQUET:  * No tourniquets in log *  DICTATION: .Dragon Dictation  PLAN OF CARE: Discharge to home after PACU  PATIENT DISPOSITION:  PACU - hemodynamically stable.   Delay start of Pharmacological VTE agent (>24hrs) due to surgical blood loss or risk of bleeding: not applicable  The risks, benefits, and alternatives of surgery were explained, understood, accepted. She is certain that she wants permanent sterility. She understands that this is not reversible. She understands there is a small failure rate of this procedure. She also would like to have both oviducts removed her due newest recommendations to help prevent ovarian/peritoneal cancer. In the operating room she was placed in the dorsal lithotomy position, and general anesthesia was given without complication. Her abdomen and vagina were prepped and draped in the usual sterile fashion. A timeout procedure was done. A bimanual exam revealed a small anteverted and mobile uterus. Her adnexa felt normal. A Hulka manipulator was placed. Her bladder was emptied with a Robinson catheter. Gloves were changed, and attention was turned to the abdomen. Approximately the 5 mL of 0.5% Marcaine was injected into the LUQ. A vertical  incision was made at the site. A varies needle was placed intraperitoneally. Low-flow CO2 was used to insufflate the abdomen to approximately 3-1/2 L. Once a good pneumoperitoneum was established, a 10 mm Excel trocar was placed in the RLQ. Laparoscopy confirmed correct placement. A 5 mm port was placed in the left lower quadrant under direct laparoscopic visualization after injecting 0.5% Marcaine in the incision sites. Her pelvis and upper abdomen appeared normal. A Harmonic scapel was used to hemostatically removed both oviducts.The oviducts were removed through the 10 mm port and sent to pathology. I removed the 5 mm ports and noted hemostasis. The right lower quadrant fascia was closed with a 0 vicryl suture. No defects were palpable. Dermabond was used at all incision sites. I then proceded with the mid urethral sling placement. I placed a weighted speculum in the vagina. I injected 0.5% marcaine in the vaginal mucosa and vertically behind each side of the suprapubic bone. I made a small incision about 1.5 cm below the urethra. I  Diverted the bladder to each side while I placed a Gynecare Stage manager. Excellent hemostasis was noted. I closed the vaginal mucosal incision with a 4-0 vicryl suture after a cystoscopy showed no damage to the bladder. She was extubated and taken to the recovery room in stable condition.

## 2015-12-13 NOTE — Anesthesia Procedure Notes (Signed)
Procedure Name: Intubation Date/Time: 12/13/2015 9:30 AM Performed by: Gilmer Mor R Pre-anesthesia Checklist: Patient identified, Emergency Drugs available, Suction available and Patient being monitored Patient Re-evaluated:Patient Re-evaluated prior to inductionOxygen Delivery Method: Circle system utilized Preoxygenation: Pre-oxygenation with 100% oxygen Intubation Type: IV induction Ventilation: Mask ventilation without difficulty Laryngoscope Size: Mac and 3 Grade View: Grade II Tube type: Oral Tube size: 7.0 mm Number of attempts: 1 Airway Equipment and Method: Stylet Placement Confirmation: ETT inserted through vocal cords under direct vision,  positive ETCO2 and breath sounds checked- equal and bilateral Secured at: 21 (right lip) cm Tube secured with: Tape Dental Injury: Teeth and Oropharynx as per pre-operative assessment

## 2015-12-13 NOTE — Transfer of Care (Signed)
Immediate Anesthesia Transfer of Care Note  Patient: Evelyn [name]  Procedure(s) Performed: Procedure(s): PUBO-VAGINAL SLING (N/A) LAPAROSCOPIC BILATERAL SALPINGECTOMY (N/A)  Patient Location: PACU  Anesthesia Type:General  Level of Consciousness: awake  Airway & Oxygen Therapy: Patient Spontanous Breathing  Post-op Assessment: Report given to PACU RN  Post vital signs: stable  Filed Vitals:   12/13/15 0808  BP: 114/88  Pulse: 72  Temp: 36.7 C  Resp: 18    Complications: No apparent anesthesia complications

## 2015-12-13 NOTE — H&P (Signed)
Evelyn [name] is an 30 yo MH P4 (6, 28, 73, 65 yo kids) here today for several reasons.  1) She currently uses depo provera and is interested in having permanent sterility.  She was seeing Dr. Freda Munro who had planned to do a sterilization procedure along with a "bladder lift".  2) She complains of worsening GSUI, wears pads daily. She has a +Qtip test.       Past Medical History  Diagnosis Date  . HSV (herpes simplex virus) infection   . GERD (gastroesophageal reflux disease)   . Allergy   . Depression     post partum  . HELLP syndrome     2015  . Vaginal delivery 2004, 2009, 2011    Past Surgical History  Procedure Laterality Date  . Cesarean section N/A 08/05/2014    Procedure: CESAREAN SECTION;  Surgeon: Jerelyn Charles, MD;  Location: [facility];  Service: Obstetrics;  Laterality: N/A;  . Hernia repair  2011    Family History  Problem Relation Age of Onset  . Asthma Son     Social History:  reports that she has never smoked. She has never used smokeless tobacco. She reports that she drinks alcohol. She reports that she does not use illicit drugs.  Allergies: No Known Allergies  Prescriptions prior to admission  Medication Sig Dispense Refill Last Dose  . amoxicillin (AMOXIL) 500 MG capsule Take 1 capsule (500 mg total) by mouth 3 (three) times daily. (Patient taking differently: Take 500 mg by mouth 3 (three) times daily. 12-31 to 01-7) 21 capsule 0   . benzonatate (TESSALON) 100 MG capsule Take 1 capsule (100 mg total) by mouth every 8 (eight) hours. (Patient taking differently: Take 100 mg by mouth every 8 (eight) hours as needed for cough. ) 21 capsule 0   . valACYclovir (VALTREX) 500 MG tablet Take 1 tablet (500 mg total) by mouth daily. 30 tablet 11 Past Month at Unknown time  . cetirizine (ZYRTEC) 10 MG tablet Take 10 mg by mouth daily as needed for allergies.   More than a month at Unknown time    ROS  Blood pressure 114/88, pulse 72, temperature 98 F  (36.7 C), temperature source Oral, resp. rate 18, height 5\' 1"  (1.549 m), weight 61.236 kg (135 lb), last menstrual period 11/06/2015, SpO2 100 %, currently breastfeeding. Physical Exam [id number] Heart- rrr Lungs- CTAB Abd-benign Results for orders placed or performed during the hospital encounter of 12/13/15 (from the past 24 hour(s))  CBC     Status: None   Collection Time: 12/13/15  7:55 AM  Result Value Ref Range   WBC 6.1 4.0 - 10.5 K/uL   RBC 4.34 3.87 - 5.11 MIL/uL   Hemoglobin 13.0 12.0 - 15.0 g/dL   HCT 37.8 36.0 - 46.0 %   MCV 87.1 78.0 - 100.0 fL   MCH 30.0 26.0 - 34.0 pg   MCHC 34.4 30.0 - 36.0 g/dL   RDW 13.0 11.5 - 15.5 %   Platelets 305 150 - 400 K/uL  Pregnancy, urine     Status: None   Collection Time: 12/13/15  8:00 AM  Result Value Ref Range   Preg Test, Ur NEGATIVE NEGATIVE    No results found.  Assessment/Plan: Desire for permanent sterility- plan for l/s BS so that she will have a decreased incidence of ovarian cancer in the future. GSUI- I will plan to place a sling. She is aware of the risks of a permanent mesh. Of note, she  has a permanent mesh in her umbilicus for a hernia repair. I will therefore not use an umbilical incision and instead use a LUQ port site.  She understands the risks of surgery, including, but not to infection, bleeding, DVTs, damage to bowel, bladder, ureters. She wishes to proceed.     Demetrick Eichenberger C. 12/13/2015, 9:03 AM

## 2015-12-13 NOTE — Discharge Instructions (Signed)
Laparoscopic Tubal Ligation, Care After Refer to this sheet in the next few weeks. These instructions provide you with information about caring for yourself after your procedure. Your health care provider may also give you more specific instructions. Your treatment has been planned according to current medical practices, but problems sometimes occur. Call your health care provider if you have any problems or questions after your procedure. WHAT TO EXPECT AFTER THE PROCEDURE After your procedure, it is common to have:  Sore throat.  Soreness at the incision site.  Mild cramping.  Tiredness.  Mild nausea or vomiting.  Shoulder pain. HOME CARE INSTRUCTIONS  Rest for the remainder of the day.  Take medicines only as directed by your health care provider. These include over-the-counter medicines and prescription medicines. Do not take aspirin, which can cause bleeding.  Over the next few days, gradually return to your normal activities and your normal diet.  Avoid sexual intercourse for 2 weeks or as directed by your health care provider.  Do not use tampons, and do not douche.  Do not drive or operate heavy machinery while taking pain medicine.  Do not lift anything that is heavier than 5 lb (2.3 kg) for 2 weeks or as directed by your health care provider.  Do not take baths. Take showers only. Ask your health care provider when you can start taking baths.  Take your temperature twice each day and write it down.  Try to have help for your household needs for the first 7-10 days.  There are many different ways to close and cover an incision, including stitches (sutures), skin glue, and adhesive strips. Follow instructions from your health care provider about:  Incision care.  Bandage (dressing) changes and removal.  Incision closure removal.  Check your incision area every day for signs of infection. Watch for:  Redness, swelling, or pain.  Fluid, blood, or pus.  Keep  all follow-up visits as directed by your health care provider. SEEK MEDICAL CARE IF:  You have redness, swelling, or increasing pain in your incision area.  You have fluid, blood, or pus coming from your incision for longer than 1 day.  You notice a bad smell coming from your incision or your dressing.  The edges of your incision break open after the sutures have been removed.  Your pain does not decrease after 2-3 days.  You have a rash.  You repeatedly become dizzy or light-headed.  You have a reaction to your medicine.  Your pain medicine is not helping.  You are constipated. SEEK IMMEDIATE MEDICAL CARE IF:  You have a fever.  You faint.  You have increasing pain in your abdomen.  You have severe pain in one or both of your shoulders.  You have bleeding or drainage from your suture sites or your vagina after surgery.  You have shortness of breath or have difficulty breathing.  You have chest pain or leg pain.  You have ongoing nausea, vomiting, or diarrhea.  DO NOT HAVE UNPROTECTED SEX UNTIL AFTER YOUR NEXT MENSTRUAL PERIOD.   This information is not intended to replace advice given to you by your health care provider. Make sure you discuss any questions you have with your health care provider.   Document Released: 06/08/2005 Document Revised: 04/05/2015 Document Reviewed: 03/01/2012 Elsevier Interactive Patient Education 2016 [place] INSTRUCTIONS: Laparoscopy  The following instructions have been prepared to help you care for yourself upon your return home today.  Wound care:  Do not  get the incision wet for the first 24 hours. The incision should be kept clean and dry.  The Band-Aids or dressings may be removed the day after surgery.  Should the incision become sore, red, and swollen after the first week, check with your doctor.  Personal hygiene:  Shower the day after your procedure.  Activity and limitations:  Do NOT drive or  operate any equipment today.  Do NOT lift anything more than 15 pounds for 2-3 weeks after surgery.  Do NOT rest in bed all day.  Walking is encouraged. Walk each day, starting slowly with 5-minute walks 3 or 4 times a day. Slowly increase the length of your walks.  Walk up and down stairs slowly.  Do NOT do strenuous activities, such as golfing, playing tennis, bowling, running, biking, weight lifting, gardening, mowing, or vacuuming for 2-4 weeks. Ask your doctor when it is okay to start.  Diet: Eat a light meal as desired this evening. You may resume your usual diet tomorrow.  Return to work: This is dependent on the type of work you do. For the most part you can return to a desk job within a week of surgery. If you are more active at work, please discuss this with your doctor.  What to expect after your surgery: You may have a slight burning sensation when you urinate on the first day. You may have a very small amount of blood in the urine. Expect to have a small amount of vaginal discharge/light bleeding for 1-2 weeks. It is not unusual to have abdominal soreness and bruising for up to 2 weeks. You may be tired and need more rest for about 1 week. You may experience shoulder pain for 24-72 hours. Lying flat in bed may relieve it.  NO IBUPROFEN PRODUCTS (MOTRIN, ADVIL) OR ALEVE UNTIL 4:15PM TODAY.  SCOPE PATCH MAY BE REMOVED ON OR BEFORE 12/16/15.   Call your doctor for any of the following:  Develop a fever of 100.4 or greater  Inability to urinate 6 hours after discharge from hospital  Severe pain not relieved by pain medications  Persistent of heavy bleeding at incision site  Redness or swelling around incision site after a week  Increasing nausea or vomiting  Patient Signature________________________________________ Nurse Signature_________________________________________

## 2015-12-13 NOTE — Anesthesia Preprocedure Evaluation (Signed)
Anesthesia Evaluation  Patient identified by MRN, date of birth, ID band Patient awake    Reviewed: Allergy & Precautions, NPO status , Patient's Chart, lab work & pertinent test results  Airway Mallampati: II  TM Distance: >3 FB Neck ROM: Full    Dental   Pulmonary neg pulmonary ROS,    breath sounds clear to auscultation       Cardiovascular hypertension,  Rhythm:Regular Rate:Normal     Neuro/Psych    GI/Hepatic Neg liver ROS, GERD  ,  Endo/Other    Renal/GU negative Renal ROS     Musculoskeletal   Abdominal   Peds  Hematology   Anesthesia Other Findings   Reproductive/Obstetrics                             Anesthesia Physical Anesthesia Plan  ASA: III  Anesthesia Plan: General   Post-op Pain Management:    Induction: Intravenous  Airway Management Planned: Oral ETT  Additional Equipment:   Intra-op Plan:   Post-operative Plan: Extubation in OR  Informed Consent: I have reviewed the patients History and Physical, chart, labs and discussed the procedure including the risks, benefits and alternatives for the proposed anesthesia with the patient or authorized representative who has indicated his/her understanding and acceptance.   Dental advisory given  Plan Discussed with: Anesthesiologist and CRNA  Anesthesia Plan Comments:         Anesthesia Quick Evaluation

## 2015-12-13 NOTE — Progress Notes (Signed)
Patient has attempted to void twice since moved to phase 2 without success. States that she feels pressure to go, but cannot. Patient moved to stretcher and bladder scanned. Approx. 50-85 cc present per scanner. No bladder distention noted. IVF's connected to SL. Bolus of IVF running to assist patient in filling bladder. Patient is having shoulder discomfort from gas pressure. Feels better reclined. Lying on stretcher in [place] 1. Family updated.

## 2015-12-13 NOTE — Anesthesia Postprocedure Evaluation (Signed)
Anesthesia Post Note  Patient: Evelyn [name]  Procedure(s) Performed: Procedure(s) (LRB): PUBO-VAGINAL SLING (N/A) LAPAROSCOPIC BILATERAL SALPINGECTOMY (N/A)  Patient location during evaluation: PACU Anesthesia Type: General Level of consciousness: awake Pain management: pain level controlled Vital Signs Assessment: post-procedure vital signs reviewed and stable Respiratory status: spontaneous breathing Anesthetic complications: no    Last Vitals:  Filed Vitals:   12/13/15 1100 12/13/15 1115  BP: 104/63 100/65  Pulse: 69 68  Temp:    Resp: 16 16    Last Pain: There were no vitals filed for this visit.               EDWARDS,Drago Hammonds

## 2015-12-14 ENCOUNTER — Encounter (HOSPITAL_COMMUNITY): Payer: Self-pay | Admitting: Obstetrics & Gynecology

## 2016-01-25 ENCOUNTER — Ambulatory Visit: Payer: Medicaid Other | Admitting: Obstetrics & Gynecology

## 2016-01-25 ENCOUNTER — Encounter: Payer: Self-pay | Admitting: Obstetrics & Gynecology

## 2016-01-25 VITALS — BP 126/70 | HR 84 | Ht 61.0 in | Wt 134.3 lb

## 2016-01-25 DIAGNOSIS — Z9889 Other specified postprocedural states: Secondary | ICD-10-CM

## 2016-01-25 NOTE — Progress Notes (Signed)
   Subjective:    Patient ID: Evelyn [name], female    DOB: Oct 29, 1986, 29 y.o.   MRN: [id number]  HPI  This 30 yo MH is here 6 weeks post op after a lap BS and sling. She is very happy with her lack of incontinence.   Her only complaint is a lot of vaginal laxity since her delivery. She is concerned by the discomfort caused by the fact that her vagina "never closes up".   Review of Systems     Objective:   Physical Exam [id number] Breathing, conversing, and ambulating normally Abd- benign, incisions healed well EG- small rectocele, gaping introitus Sling not palpable through vaginal mucosa       Assessment & Plan:  Post op- doing well Rectocele and vaginal laxity- plan for posterior repair and perineorrhaphy

## 2016-01-27 ENCOUNTER — Other Ambulatory Visit: Payer: Self-pay | Admitting: Family Medicine

## 2016-01-27 ENCOUNTER — Encounter (HOSPITAL_COMMUNITY): Payer: Self-pay | Admitting: *Deleted

## 2016-01-31 ENCOUNTER — Telehealth: Payer: Self-pay | Admitting: *Deleted

## 2016-01-31 DIAGNOSIS — B009 Herpesviral infection, unspecified: Secondary | ICD-10-CM

## 2016-01-31 MED ORDER — VALACYCLOVIR HCL 500 MG PO TABS: 500.0000 mg | ORAL_TABLET | Freq: Every day | ORAL | Status: DC

## 2016-01-31 NOTE — Telephone Encounter (Signed)
Rx sent to pharmacy for valtrex.

## 2016-02-01 ENCOUNTER — Encounter (HOSPITAL_COMMUNITY): Payer: Self-pay

## 2016-02-19 ENCOUNTER — Encounter (HOSPITAL_COMMUNITY): Payer: Self-pay | Admitting: Emergency Medicine

## 2016-02-19 ENCOUNTER — Emergency Department (HOSPITAL_COMMUNITY)
Admission: EM | Admit: 2016-02-19 | Discharge: 2016-02-19 | Disposition: A | Discharge status: Home / Self Care | Payer: Medicaid Other | Attending: Emergency Medicine | Admitting: Emergency Medicine

## 2016-02-19 DIAGNOSIS — Z8659 Personal history of other mental and behavioral disorders: Secondary | ICD-10-CM | POA: Insufficient documentation

## 2016-02-19 DIAGNOSIS — Z8619 Personal history of other infectious and parasitic diseases: Secondary | ICD-10-CM | POA: Diagnosis not present

## 2016-02-19 DIAGNOSIS — Y92410 Unspecified street and highway as the place of occurrence of the external cause: Secondary | ICD-10-CM | POA: Insufficient documentation

## 2016-02-19 DIAGNOSIS — S301XXA Contusion of abdominal wall, initial encounter: Secondary | ICD-10-CM | POA: Diagnosis not present

## 2016-02-19 DIAGNOSIS — S4992XA Unspecified injury of left shoulder and upper arm, initial encounter: Secondary | ICD-10-CM | POA: Diagnosis not present

## 2016-02-19 DIAGNOSIS — S0992XA Unspecified injury of nose, initial encounter: Secondary | ICD-10-CM | POA: Diagnosis not present

## 2016-02-19 DIAGNOSIS — S0990XA Unspecified injury of head, initial encounter: Secondary | ICD-10-CM | POA: Diagnosis not present

## 2016-02-19 DIAGNOSIS — R1032 Left lower quadrant pain: Secondary | ICD-10-CM

## 2016-02-19 DIAGNOSIS — Z8719 Personal history of other diseases of the digestive system: Secondary | ICD-10-CM | POA: Diagnosis not present

## 2016-02-19 DIAGNOSIS — Y998 Other external cause status: Secondary | ICD-10-CM | POA: Diagnosis not present

## 2016-02-19 DIAGNOSIS — S161XXA Strain of muscle, fascia and tendon at neck level, initial encounter: Secondary | ICD-10-CM | POA: Insufficient documentation

## 2016-02-19 DIAGNOSIS — Y9389 Activity, other specified: Secondary | ICD-10-CM | POA: Insufficient documentation

## 2016-02-19 DIAGNOSIS — S199XXA Unspecified injury of neck, initial encounter: Secondary | ICD-10-CM | POA: Diagnosis present

## 2016-02-19 LAB — COMPREHENSIVE METABOLIC PANEL
ALK PHOS: 119 U/L (ref 38–126)
ALT: 18 U/L (ref 14–54)
ANION GAP: 9 (ref 5–15)
AST: 21 U/L (ref 15–41)
Albumin: 4.7 g/dL (ref 3.5–5.0)
BILIRUBIN TOTAL: 0.8 mg/dL (ref 0.3–1.2)
BUN: 14 mg/dL (ref 6–20)
CALCIUM: 9.7 mg/dL (ref 8.9–10.3)
CO2: 24 mmol/L (ref 22–32)
Chloride: 106 mmol/L (ref 101–111)
Creatinine, Ser: 0.67 mg/dL (ref 0.44–1.00)
GFR calc non Af Amer: >60 mL/min (ref >60)
GLUCOSE: 96 mg/dL (ref 65–99)
Potassium: 3.8 mmol/L (ref 3.5–5.1)
Sodium: 139 mmol/L (ref 135–145)
TOTAL PROTEIN: 8.3 g/dL — AB (ref 6.5–8.1)

## 2016-02-19 LAB — CBC
HCT: 36.5 % (ref 36.0–46.0)
HEMOGLOBIN: 12.6 g/dL (ref 12.0–15.0)
MCH: 29.4 pg (ref 26.0–34.0)
MCHC: 34.5 g/dL (ref 30.0–36.0)
MCV: 85.3 fL (ref 78.0–100.0)
PLATELETS: 327 10*3/uL (ref 150–400)
RBC: 4.28 MIL/uL (ref 3.87–5.11)
RDW: 13.1 % (ref 11.5–15.5)
WBC: 6.6 10*3/uL (ref 4.0–10.5)

## 2016-02-19 LAB — LIPASE, BLOOD: Lipase: 23 U/L (ref 11–51)

## 2016-02-19 MED ORDER — HYDROCODONE-ACETAMINOPHEN 5-325 MG PO TABS: 1.0000 | ORAL_TABLET | Freq: Four times a day (QID) | ORAL | Status: DC | PRN

## 2016-02-19 MED ORDER — HYDROCODONE-ACETAMINOPHEN 5-325 MG PO TABS
1.0000 | ORAL_TABLET | Freq: Once | ORAL | Status: AC
  Administered 2016-02-19: 1 via ORAL
  Filled 2016-02-19: qty 1

## 2016-02-19 MED ORDER — IBUPROFEN 600 MG PO TABS: 600.0000 mg | ORAL_TABLET | Freq: Four times a day (QID) | ORAL | Status: DC | PRN

## 2016-02-19 MED ORDER — METHOCARBAMOL 500 MG PO TABS: 500.0000 mg | ORAL_TABLET | Freq: Two times a day (BID) | ORAL | Status: DC
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
## 2016-02-19 NOTE — Discharge Instructions (Signed)
Distensión cervical °(Cervical Sprain) °Una distensión cervical es una lesión en el cuello, en la que los tejidos fuertes y fibrosos (ligamentos) que unen los huesos del cuello, se distienden o se rompen. Una distensión cervical puede ser desde muy leve a muy grave. En los casos graves pueden hacer que las vértebras del cuello se vuelvan inestables. Esto puede causar un daño en la médula espinal y puede dar lugar a graves problemas del sistema nervioso. La cantidad de tiempo que demora la mejoría de una distensión cervical depende de la causa y de la extensión de la lesión. La mayoría de las veces se cura en 1 a 3 semanas. °CAUSAS  °Las distensiones graves pueden ser causadas por:  °· Lesiones por deportes de contacto (como en el fútbol americano, rugby, lucha, hockey, automovilismo, gimnasia, buceo, artes marciales y boxeo). °· Colisiones en vehículos de motor. °· Lesiones de latigazo cervical. Esta es una lesión por movimiento brusco de adelante hacia atrás de la cabeza y el cuello. °· Caídas. °La causa de las distensiones cervicales leves pueden ser:  °· Adoptar posiciones incómodas, como sostener el teléfono entre la oreja y el hombro. °· Sentarse en una silla que no ofrece el soporte adecuado. °· Trabajar en una mesa de computadora mal diseñada. °· Las actividades que requieren mirar hacia arriba o hacia abajo durante largos períodos. °SÍNTOMAS  °· Dolor, sensibilidad, rigidez, o sensación de ardor en la parte anterior, posterior o lateral del cuello. Este malestar puede aparecer inmediatamente después de la lesión o puede desarrollarse lentamente y no empezar hasta 24 horas o más después de la lesión. °· Dolor o sensibilidad que se siente directamente en la parte media posterior del cuello. °· Dolor en el hombro o la zona superior de la espalda. °· Capacidad limitada para mover el cuello. °· Dolor de cabeza. °· Mareos. °· Debilidad, entumecimiento u hormigueo en las manos o los brazos. °· Espasmos  musculares. °· Dificultades para tragar o comer. °· Sensibilidad e hinchazón en el cuello. °DIAGNÓSTICO  °La mayoría de las veces, el médico puede diagnosticar este problema mediante la historia clínica y un examen físico. Su médico le preguntará acerca de lesiones previas y problemas conocidos como artritis en el cuello. Podrán tomarle radiografías para determinar si hay otros problemas, como enfermedades en los huesos del cuello. También puede ser necesario realizar otras pruebas, como tomografías computadas o resonancia magnética.  °TRATAMIENTO  °El tratamiento depende de la gravedad de la distensión. Las distensiones leves se pueden tratar con reposo, manteniendo el cuello en su lugar (inmovilización) y usando medicamentos para el dolor. Las distensiones graves deben ser inmediatamente inmovilizadas. Será necesario completar el tratamiento para aliviar el dolor, los espasmos musculares y otros síntomas, y puede incluir. °· Medicamentos como calmantes para el dolor, anestésicos o relajantes musculares. °· Fisioterapia. Esto puede incluir ejercicios de elongación, fortalecimiento y entrenamiento de la postura. Los ejercicios y una mejor postura pueden ayudar a estabilizar el cuello, fortalecer los músculos y evitar que los síntomas vuelvan a aparecer. °INSTRUCCIONES PARA EL CUIDADO EN EL HOGAR  °· Aplique hielo sobre la zona lesionada. °¨ Ponga el hielo en una bolsa plástica. °¨ Colóquese una toalla entre la piel y la bolsa de hielo. °¨ Deje el hielo durante 15 - 20 minutos y aplíquelo 3 - 4 veces por día. °· Si la lesión fue grave, le indicarán el uso de un collarín cervical. El collarín cervical es un collar de dos piezas para impedir que el cuello se mueva mientras se cura. °¨   Nose quite el collarn excepto que se lo indique su mdico.  Si tiene el cabello largo, mantngalo fuera del collarn.  Consulte a su mdico antes de hacerle ajustes. Los [employer] pueden ser requeridos con el tiempo para  Garment/textile technologist confort y reducir la presin sobre la barbilla o en la parte posterior de la cabeza.  Si le permiten quitarse el collarn para lavarlo o darse un bao, siga las indicaciones de su mdico acerca de cmo hacerlo con seguridad.  Mantenga el collarn limpio pasando un pao con agua y [place] y secndolo bien. Si el collarn tiene almohadillas removibles, qutelas cada 1-2 das para lavarlas a mano con agua y [place]. Deje que se sequen al aire. Debe secarlas bien antes de volver a colocarlas en el collarn.  Si le permiten quitarse el collarn para lavarlo y darse un bao, lave y seque la piel del cuello. Controle su piel para detectar irritacin o llagas. Si las tiene, informe a su mdico.  No conduzca vehculos mientras [place] el collarn.  Slo tome medicamentos de venta libre o recetados para Glass blower/designer, el malestar o bajar la fiebre, segn las indicaciones de su mdico.  Cumpla con todas las visitas de control, segn le indique su mdico.  Cumpla con todas las sesiones de fisioterapia, segn le indique su mdico.  Haga los ajustes necesarios en su lugar de [place] para favorecer una buena postura.  Evite las posiciones y actividades que [employer] sntomas.  Haga precalentamiento y elongue antes de comenzar una actividad para Customer service manager. SOLICITE ATENCIN MDICA SI:   El dolor no se alivia con los [employer].  No puede disminuir la dosis de analgsicos segn lo planificado.  Su nivel de actividad no mejora segn lo esperado. SOLICITE ATENCIN MDICA DE INMEDIATO SI:   Presenta cualquier hemorragia.  Siente Higher education careers adviser.  Tiene signos de reaccin alrgica a los medicamentos.  Los sntomas empeoran.  Le aparecen sntomas nuevos e inexplicables.  Siente adormecimiento, hormigueo, debilidad o parlisis en alguna parte del cuerpo. ASEGRESE DE QUE:   Comprende estas instrucciones.  Controlar su afeccin.  Recibir ayuda de inmediato si no mejora o  si empeora.   Esta informacin no tiene Marine scientist el consejo del mdico. Asegrese de hacerle al mdico cualquier pregunta que tenga.   Document Released: 02/15/2009 Document Revised: 09/09/2013 Elsevier Interactive Patient Education [employer].
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
## 2016-02-20 ENCOUNTER — Encounter (HOSPITAL_COMMUNITY): Payer: Self-pay | Admitting: *Deleted

## 2016-02-20 ENCOUNTER — Encounter (HOSPITAL_COMMUNITY)
Admission: RE | Disposition: A | Discharge status: Home / Self Care | Payer: Self-pay | Source: Ambulatory Visit | Attending: Obstetrics & Gynecology

## 2016-02-20 ENCOUNTER — Ambulatory Visit (HOSPITAL_COMMUNITY): Payer: Medicaid Other | Admitting: Anesthesiology

## 2016-02-20 ENCOUNTER — Ambulatory Visit (HOSPITAL_COMMUNITY)
Admission: RE | Admit: 2016-02-20 | Discharge: 2016-02-20 | Disposition: A | Discharge status: Home / Self Care | Payer: Medicaid Other | Source: Ambulatory Visit | Attending: Obstetrics & Gynecology | Admitting: Obstetrics & Gynecology

## 2016-02-20 DIAGNOSIS — K219 Gastro-esophageal reflux disease without esophagitis: Secondary | ICD-10-CM | POA: Diagnosis not present

## 2016-02-20 DIAGNOSIS — N816 Rectocele: Secondary | ICD-10-CM | POA: Diagnosis present

## 2016-02-20 DIAGNOSIS — A6000 Herpesviral infection of urogenital system, unspecified: Secondary | ICD-10-CM | POA: Diagnosis not present

## 2016-02-20 HISTORY — PX: PERINEOPLASTY: SHX2218

## 2016-02-20 HISTORY — PX: RECTOCELE REPAIR: SHX761

## 2016-02-20 LAB — PREGNANCY, URINE: PREG TEST UR: NEGATIVE

## 2016-02-20 SURGERY — COLPORRHAPHY, POSTERIOR, FOR RECTOCELE REPAIR
Anesthesia: General | Site: Vulva

## 2016-02-20 MED ORDER — MIDAZOLAM HCL 2 MG/2ML IJ SOLN
INTRAMUSCULAR | Status: AC
  Filled 2016-02-20: qty 2

## 2016-02-20 MED ORDER — ESTRADIOL 0.1 MG/GM VA CREA
TOPICAL_CREAM | VAGINAL | Status: AC
  Filled 2016-02-20: qty 42.5

## 2016-02-20 MED ORDER — ONDANSETRON HCL 4 MG/2ML IJ SOLN
INTRAMUSCULAR | Status: AC
  Filled 2016-02-20: qty 2

## 2016-02-20 MED ORDER — BUPIVACAINE HCL (PF) 0.5 % IJ SOLN
INTRAMUSCULAR | Status: AC
  Filled 2016-02-20: qty 30

## 2016-02-20 MED ORDER — DEXAMETHASONE SODIUM PHOSPHATE 4 MG/ML IJ SOLN
INTRAMUSCULAR | Status: AC
  Filled 2016-02-20: qty 1

## 2016-02-20 MED ORDER — ONDANSETRON HCL 4 MG/2ML IJ SOLN: 4.0000 mg | Freq: Once | INTRAMUSCULAR | Status: DC | PRN

## 2016-02-20 MED ORDER — SODIUM CHLORIDE 0.9 % IJ SOLN
INTRAMUSCULAR | Status: DC | PRN
  Administered 2016-02-20: 50 mL

## 2016-02-20 MED ORDER — ONDANSETRON HCL 4 MG/2ML IJ SOLN
INTRAMUSCULAR | Status: DC | PRN
  Administered 2016-02-20: 4 mg via INTRAVENOUS

## 2016-02-20 MED ORDER — IBUPROFEN 600 MG PO TABS: 600.0000 mg | ORAL_TABLET | Freq: Four times a day (QID) | ORAL | Status: DC | PRN

## 2016-02-20 MED ORDER — FENTANYL CITRATE (PF) 250 MCG/5ML IJ SOLN
INTRAMUSCULAR | Status: AC
  Filled 2016-02-20: qty 5

## 2016-02-20 MED ORDER — SCOPOLAMINE 1 MG/3DAYS TD PT72
MEDICATED_PATCH | TRANSDERMAL | Status: AC
  Administered 2016-02-20: 1.5 mg via TRANSDERMAL
  Filled 2016-02-20: qty 1

## 2016-02-20 MED ORDER — SCOPOLAMINE 1 MG/3DAYS TD PT72
1.0000 | MEDICATED_PATCH | Freq: Once | TRANSDERMAL | Status: DC
  Administered 2016-02-20: 1.5 mg via TRANSDERMAL

## 2016-02-20 MED ORDER — LACTATED RINGERS IV SOLN
INTRAVENOUS | Status: DC
Start: 2016-02-20 — End: 2016-02-20
  Administered 2016-02-20 (×2): via INTRAVENOUS

## 2016-02-20 MED ORDER — OXYCODONE-ACETAMINOPHEN 5-325 MG PO TABS: 1.0000 | ORAL_TABLET | Freq: Four times a day (QID) | ORAL | Status: DC | PRN

## 2016-02-20 MED ORDER — PROPOFOL 10 MG/ML IV BOLUS
INTRAVENOUS | Status: DC | PRN
Start: 2016-02-20 — End: 2016-02-20
  Administered 2016-02-20: 170 mg via INTRAVENOUS

## 2016-02-20 MED ORDER — FENTANYL CITRATE (PF) 100 MCG/2ML IJ SOLN
INTRAMUSCULAR | Status: AC
  Filled 2016-02-20: qty 2

## 2016-02-20 MED ORDER — BUPIVACAINE HCL (PF) 0.5 % IJ SOLN
INTRAMUSCULAR | Status: DC | PRN
  Administered 2016-02-20: 30 mL

## 2016-02-20 MED ORDER — PROPOFOL 10 MG/ML IV BOLUS
INTRAVENOUS | Status: AC
  Filled 2016-02-20: qty 20

## 2016-02-20 MED ORDER — FENTANYL CITRATE (PF) 100 MCG/2ML IJ SOLN
INTRAMUSCULAR | Status: DC | PRN
  Administered 2016-02-20: 50 ug via INTRAVENOUS
  Administered 2016-02-20: 100 ug via INTRAVENOUS

## 2016-02-20 MED ORDER — DEXAMETHASONE SODIUM PHOSPHATE 10 MG/ML IJ SOLN
INTRAMUSCULAR | Status: DC | PRN
  Administered 2016-02-20: 4 mg via INTRAVENOUS

## 2016-02-20 MED ORDER — SODIUM CHLORIDE 0.9 % IJ SOLN
INTRAMUSCULAR | Status: AC
  Filled 2016-02-20: qty 50

## 2016-02-20 MED ORDER — FENTANYL CITRATE (PF) 100 MCG/2ML IJ SOLN
25.0000 ug | INTRAMUSCULAR | Status: DC | PRN
  Administered 2016-02-20: 50 ug via INTRAVENOUS

## 2016-02-20 MED ORDER — MIDAZOLAM HCL 2 MG/2ML IJ SOLN
INTRAMUSCULAR | Status: DC | PRN
  Administered 2016-02-20: 2 mg via INTRAVENOUS

## 2016-02-20 MED ORDER — LIDOCAINE HCL (CARDIAC) 20 MG/ML IV SOLN
INTRAVENOUS | Status: DC | PRN
  Administered 2016-02-20: 60 mg via INTRAVENOUS

## 2016-02-20 MED ORDER — LIDOCAINE HCL (CARDIAC) 20 MG/ML IV SOLN
INTRAVENOUS | Status: AC
  Filled 2016-02-20: qty 5

## 2016-02-20 SURGICAL SUPPLY — 19 items
BLADE SURG 15 STRL LF C SS BP (BLADE) ×2 IMPLANT
BLADE SURG 15 STRL SS (BLADE) ×2
CLOTH BEACON ORANGE TIMEOUT ST (SAFETY) ×4 IMPLANT
DECANTER SPIKE VIAL GLASS SM (MISCELLANEOUS) ×8 IMPLANT
GAUZE PACKING 1 X5 YD ST (GAUZE/BANDAGES/DRESSINGS) ×4 IMPLANT
GLOVE BIO SURGEON STRL SZ 6.5 (GLOVE) ×3 IMPLANT
GLOVE BIO SURGEONS STRL SZ 6.5 (GLOVE) ×1
GLOVE BIOGEL PI IND STRL 7.0 (GLOVE) ×2 IMPLANT
GLOVE BIOGEL PI INDICATOR 7.0 (GLOVE) ×2
GOWN STRL REUS W/TWL LRG LVL3 (GOWN DISPOSABLE) ×16 IMPLANT
NEEDLE HYPO 22GX1.5 SAFETY (NEEDLE) ×4 IMPLANT
NS IRRIG 1000ML POUR BTL (IV SOLUTION) ×4 IMPLANT
PACK VAGINAL WOMENS (CUSTOM PROCEDURE TRAY) ×4 IMPLANT
SUT VIC AB 0 CT1 27 (SUTURE)
SUT VIC AB 0 CT1 27XBRD ANBCTR (SUTURE) IMPLANT
SUT VIC AB 2-0 CT2 27 (SUTURE) ×16 IMPLANT
TOWEL OR 17X24 6PK STRL BLUE (TOWEL DISPOSABLE) ×8 IMPLANT
TRAY FOLEY CATH SILVER 14FR (SET/KITS/TRAYS/PACK) ×4 IMPLANT
WATER STERILE IRR 1000ML POUR (IV SOLUTION) IMPLANT

## 2016-02-20 NOTE — Transfer of Care (Signed)
Immediate Anesthesia Transfer of Care Note  Patient: Evelyn [name]  Procedure(s) Performed: Procedure(s): POSTERIOR REPAIR (RECTOCELE) (N/A) PERINEOPLASTY (N/A)  Patient Location: PACU  Anesthesia Type:General  Level of Consciousness: awake  Airway & Oxygen Therapy: Patient Spontanous Breathing  Post-op Assessment: Report given to PACU RN  Post vital signs: stable  Filed Vitals:   02/20/16 0917  BP: 108/70  Pulse: 72  Temp: 36.8 C  Resp: 18    Complications: No apparent anesthesia complications

## 2016-02-20 NOTE — Anesthesia Preprocedure Evaluation (Signed)
Anesthesia Evaluation  Patient identified by MRN, date of birth, ID band Patient awake    Reviewed: Allergy & Precautions, NPO status , Patient's Chart, lab work & pertinent test results  History of Anesthesia Complications Negative for: history of anesthetic complications  Airway Mallampati: II  TM Distance: >3 FB Neck ROM: Full    Dental no notable dental hx. (+) Dental Advisory Given   Pulmonary neg pulmonary ROS,    Pulmonary exam normal breath sounds clear to auscultation       Cardiovascular Normal cardiovascular exam Rhythm:Regular Rate:Normal     Neuro/Psych PSYCHIATRIC DISORDERS Anxiety Depression negative neurological ROS     GI/Hepatic Neg liver ROS, GERD  ,  Endo/Other  negative endocrine ROS  Renal/GU negative Renal ROS  negative genitourinary   Musculoskeletal negative musculoskeletal ROS (+)   Abdominal   Peds negative pediatric ROS (+)  Hematology negative hematology ROS (+)   Anesthesia Other Findings   Reproductive/Obstetrics negative OB ROS                             Anesthesia Physical Anesthesia Plan  ASA: II  Anesthesia Plan: General   Post-op Pain Management:    Induction: Intravenous  Airway Management Planned: LMA  Additional Equipment:   Intra-op Plan:   Post-operative Plan: Extubation in OR  Informed Consent: I have reviewed the patients History and Physical, chart, labs and discussed the procedure including the risks, benefits and alternatives for the proposed anesthesia with the patient or authorized representative who has indicated his/her understanding and acceptance.   Dental advisory given  Plan Discussed with: CRNA  Anesthesia Plan Comments:         Anesthesia Quick Evaluation

## 2016-02-20 NOTE — Op Note (Addendum)
02/20/2016  10:36 AM  PATIENT:  Evelyn [name]  30 y.o. female  PRE-OPERATIVE DIAGNOSIS:  Symptomatic rectocele  POST-OPERATIVE DIAGNOSIS: symptomatic rectocele  PROCEDURE:  Procedure(s): POSTERIOR REPAIR (RECTOCELE) (N/A) PERINEOPLASTY (N/A)  SURGEON:  Surgeon(s) and Role:    * Emily Filbert, MD - Primary   ASSISTANTS: Liliane Bade, MS4   ANESTHESIA:   general  EBL:  Total I/O In: 1000 [I.V.:1000] Out: 100 [Blood:100]  BLOOD ADMINISTERED:none  DRAINS: none   LOCAL MEDICATIONS USED:  MARCAINE     SPECIMEN:  No Specimen  DISPOSITION OF SPECIMEN:  N/A  COUNTS:  YES  TOURNIQUET:  * No tourniquets in log *  DICTATION: .Dragon Dictation  PLAN OF CARE: Discharge to home after PACU  PATIENT DISPOSITION:  PACU - hemodynamically stable.   Delay start of Pharmacological VTE agent (>24hrs) due to surgical blood loss or risk of bleeding: not applicable  The risks, benefits, and alternatives of surgery were explained, understood, and accepted. Consents were signed. All questions were answered. She was taken the operating room and placed in the dorsal lithotomy position. LMA anesthesia was applied without complication. Her vagina was prepped and draped in the usual sterile fashion. An exam revealed a moderate rectocele and a moderate amount of vaginal laxity. A timeout procedure was done. The  vaginal mucosa was infiltrated with approximately 30 mL of a dilute Marcaine solution.A vertical incision was made from the introitus up to the cervix.T clamps were used to grasp the vaginal edges and the vaginal mucosa was dissected off the underlying rectovaginal mucosa. Interrupted 2-0 Vicryl sutures were used to close the defect. The excess vaginal mucosa was excised. A running locking 2-0 Vicryl suture was used to close the vaginal mucosal defect. Excellent hemostasis was noted. I then proceeded with the perineoplasty. The used to remove Vicryl suture and did a crown stitch. I closed the  defect with running nonlocking 2-0 Vicryl suture. Excellent cosmetic results were obtained. I packed the vagina with packing material she was. She was extubated and taken to the recovery room in stable condition.
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
## 2016-02-20 NOTE — Anesthesia Postprocedure Evaluation (Signed)
Anesthesia Post Note  Patient: Evelyn [name]  Procedure(s) Performed: Procedure(s) (LRB): POSTERIOR REPAIR (RECTOCELE) (N/A) PERINEOPLASTY (N/A)  Patient location during evaluation: PACU Anesthesia Type: General Level of consciousness: awake and alert Pain management: pain level controlled Vital Signs Assessment: post-procedure vital signs reviewed and stable Respiratory status: spontaneous breathing, nonlabored ventilation, respiratory function stable and patient connected to nasal cannula oxygen Cardiovascular status: blood pressure returned to baseline and stable Postop Assessment: no signs of nausea or vomiting Anesthetic complications: no    Last Vitals:  Filed Vitals:   02/20/16 1155 02/20/16 1235  BP:  108/69  Pulse: 69 68  Temp: 37 C   Resp: 13 16    Last Pain:  Filed Vitals:   02/20/16 1246  PainSc: 3                  Jeniah Kishi JENNETTE

## 2016-02-20 NOTE — Anesthesia Procedure Notes (Signed)
Procedure Name: LMA Insertion Date/Time: 02/20/2016 9:56 AM Performed by: Casimer Lanius A Pre-anesthesia Checklist: Patient being monitored, Patient identified, Emergency Drugs available and Suction available Patient Re-evaluated:Patient Re-evaluated prior to inductionOxygen Delivery Method: Circle system utilized Preoxygenation: Pre-oxygenation with 100% oxygen Intubation Type: IV induction and Inhalational induction Ventilation: Mask ventilation without difficulty LMA: LMA inserted LMA Size: 4.0 Number of attempts: 1 Dental Injury: Teeth and Oropharynx as per pre-operative assessment

## 2016-02-20 NOTE — Discharge Instructions (Signed)
Colporrafa anterior y posterior - Cuidados posteriores  (Anterior and Posterior Colporrhaphy, Care After) Siga estas instrucciones durante las prximas semanas. Estas indicaciones le proporcionan informacin general acerca de cmo deber cuidarse despus del procedimiento. El mdico tambin podr darle instrucciones ms especficas. El tratamiento se ha planificado de acuerdo a las prcticas mdicas actuales, pero a veces se producen problemas. Comunquese con el mdico si tiene algn problema o tiene dudas despus del procedimiento.  Grand frecuentes momentos de Occupational hygienist.   Tome slo medicamentos de venta libre o recetados, segn las indicaciones del mdico.   Evite las actividades extenuantes como levantar objetos pesados (ms de 10 libras [4.5 kg]), empujar y jalar hasta que su mdico la autorice.   Puede darse una ducha si su mdico la autoriza. Seque la incisin con golpecitos. No frote las incisiones con un pao ni con una toalla. No tome baos de inmersin hasta que el mdico la autorice.   Use medias de compresin como le haya indicado su mdico. Estas medias ayudan a evitar la formacin de cogulos en las piernas.   Consulte a su mdico cundo podr volver a Fish farm manager y a su rutina de ejercicios.   No conduzca vehculos hasta que el mdico la autorice.   Puede retomar su dieta habitual. Consuma una dieta bien balanceada.   Beba suficiente lquido para Consulting civil engineer orina clara o de color amarillo plido.   La funcin intestinal normal se reestablecer. Si est constipada podr:   Tomar un laxante suave.   Agregar frutas y salvado a su dieta.   Beber ms lquidos.  No tenga relaciones sexuales hasta que su mdico la autorice.  Concurra a las consultas de control con su mdico segn las indicaciones. SOLICITE ATENCIN MDICA SI:  Tiene nuseas o vmitos persistentes.  SOLICITE ATENCIN MDICA DE INMEDIATO  SI:   Aumenta el sangrado (ms de una pequea mancha) en la zona vaginal.   El dolor no se alivia con los medicamentos o [place].   Tiene irritacin, hinchazn o aumenta el dolor en la zona vaginal.   Siente dolor abdominal.   Hay pus en las heridas.   Tiene fiebre.   Siente mal olor en la zona vaginal.   Se marea o se desmaya.   Tiene dificultad para respirar.  ASEGRESE DE QUE:   Comprende estas instrucciones.  Controlar su afeccin.  Recibir ayuda de inmediato si no mejora o si empeora.   Esta informacin no tiene Marine scientist el consejo del mdico. Asegrese de hacerle al mdico cualquier pregunta que tenga.   Document Released: 02/15/2009 Document Revised: 04/05/2015 Elsevier Interactive Patient Education 2016 [place]: D&C / D&E The following instructions have been prepared to help you care for yourself upon your return home.   Personal hygiene:  Use sanitary pads for vaginal drainage, not tampons.  Shower the day after your procedure.  NO tub baths, pools or Jacuzzis for 2-3 weeks.  Wipe front to back after using the bathroom.  Activity and limitations:  Do NOT drive or operate any equipment for 24 hours. The effects of anesthesia are still present and drowsiness may result.  Do NOT rest in bed all day.  Walking is encouraged.  Walk up and down stairs slowly.  You may resume your normal activity in one to two days or as indicated by your physician.  Sexual activity: NO intercourse for at least 2 weeks after the procedure, or as  indicated by your physician.  Diet: Eat a light meal as desired this evening. You may resume your usual diet tomorrow.  Return to work: You may resume your work activities in one to two days or as indicated by your doctor.  What to expect after your surgery: Expect to have vaginal bleeding/discharge for 2-3 days and spotting for up to 10 days. It is not unusual to have soreness  for up to 1-2 weeks. You may have a slight burning sensation when you urinate for the first day. Mild cramps may continue for a couple of days. You may have a regular period in 2-6 weeks.  Call your doctor for any of the following:  Excessive vaginal bleeding, saturating and changing one pad every hour.  Inability to urinate 6 hours after discharge from hospital.  Pain not relieved by pain medication.  Fever of 100.4 F or greater.  Unusual vaginal discharge or odor.   Call for an appointment:    Patients signature: ______________________  Nurses signature ________________________  Support person's signature_______________________

## 2016-02-21 ENCOUNTER — Encounter (HOSPITAL_COMMUNITY): Payer: Self-pay | Admitting: Obstetrics & Gynecology

## 2016-03-30 ENCOUNTER — Ambulatory Visit: Payer: Medicaid Other | Admitting: Obstetrics & Gynecology

## 2016-03-30 ENCOUNTER — Encounter: Payer: Self-pay | Admitting: Obstetrics & Gynecology

## 2016-03-30 VITALS — BP 122/73 | HR 74 | Temp 98.4°F | Wt 133.5 lb

## 2016-03-30 DIAGNOSIS — Z9889 Other specified postprocedural states: Secondary | ICD-10-CM

## 2016-03-30 NOTE — Progress Notes (Signed)
   Subjective:    Patient ID: Evelyn [name], female    DOB: [birth year], 30 y.o.   MRN: [id number]  HPI  30 yo MH lady is here for a 6 week post op visit. Most recently she had a posterior repair and perineoplasty. She has no complaints. She has not had sex since surgery. She reports normal bowel and bladder function.   Review of Systems     Objective:   Physical Exam WNWHHFNAD Breathing and conversing normally Abd- benign Vagina and perineum healed well Good cosmetic result     Assessment & Plan:  Post op- doing well RTC 1 year/prn sooner

## 2016-07-31 ENCOUNTER — Inpatient Hospital Stay (HOSPITAL_COMMUNITY)
Admission: AD | Admit: 2016-07-31 | Discharge: 2016-07-31 | Discharge status: Against Medical Advice | Payer: Medicaid Other | Source: Ambulatory Visit | Attending: Obstetrics and Gynecology | Admitting: Obstetrics and Gynecology

## 2016-07-31 ENCOUNTER — Encounter (HOSPITAL_COMMUNITY): Payer: Self-pay | Admitting: *Deleted

## 2016-07-31 DIAGNOSIS — Z5321 Procedure and treatment not carried out due to patient leaving prior to being seen by health care provider: Secondary | ICD-10-CM | POA: Diagnosis not present

## 2016-07-31 DIAGNOSIS — N939 Abnormal uterine and vaginal bleeding, unspecified: Secondary | ICD-10-CM | POA: Insufficient documentation

## 2016-07-31 NOTE — MAU Note (Signed)
Pt states she wants to leave, cannot wait any longer, has small child.  AMA form signed.

## 2016-07-31 NOTE — MAU Note (Signed)
Having a lot of inconsistent bleeding.  Bleeding never completely stops.  Has been going on for 5 wks. Will slow to spotting, but never completely stops.

## 2016-08-17 ENCOUNTER — Encounter: Payer: Self-pay | Admitting: Obstetrics and Gynecology

## 2016-08-17 ENCOUNTER — Ambulatory Visit (INDEPENDENT_AMBULATORY_CARE_PROVIDER_SITE_OTHER): Payer: Medicaid Other | Admitting: Obstetrics and Gynecology

## 2016-08-17 VITALS — BP 104/68 | HR 69 | Ht 61.0 in | Wt 133.0 lb

## 2016-08-17 DIAGNOSIS — R102 Pelvic and perineal pain: Secondary | ICD-10-CM

## 2016-08-17 NOTE — Progress Notes (Signed)
   Subjective:    Patient ID: Evelyn [name], female    DOB: [birth year], 30 y.o.   MRN: [id number]  HPI 30 yo [id number] presenting today for evaluation of dyspareunia and irregular menses. Patient reports being amenorrheic while using Mirena IUD. She had a lsc salpingectomy and posterior repair in April. She reports feeling well up until July when she experienced a 5 week episode of vaginal bleeding alternating between spotting and heavy flow. Patient recently had a herpes outbreak to complicate things but is now feeling better. She describes the presence of a white discharge with some pruritis. Patient also report some pain with intercourse.   Review of Systems See pertinent in HPI    Objective:   Physical Exam  GENERAL: Well-developed, well-nourished female in no acute distress.  ABDOMEN: Soft, nontender, nondistended.  PELVIC: Normal external female genitalia. Vagina is pink and rugated.  Normal discharge. Normal appearing cervix. Uterus is normal in size. No adnexal mass or tenderness. EXTREMITIES: No cyanosis, clubbing, or edema, 2+ distal pulses.       Assessment & Plan:  30 yo here for evaluation of pelvic pain - Wet prep collected - Advised patient to track menstrual calendar and consider contraception for cycle control. Patient reports history of ovarian cyst with Mirena IUD. Discussed using contraception option which will prevent ovulation - Patient has Rx suppression - Advised to return to see Dr. Hulan Fray regarding dyspareunia following posterior repair

## 2016-08-17 NOTE — Progress Notes (Signed)
Post op pelvic pain. Painful Intercourse. Irregular menses.

## 2016-08-18 LAB — WET PREP, GENITAL: Trich, Wet Prep: NONE SEEN

## 2016-08-18 MED ORDER — METRONIDAZOLE 500 MG PO TABS: 500.0000 mg | ORAL_TABLET | Freq: Two times a day (BID) | ORAL | 0 refills | Status: DC

## 2016-08-18 MED ORDER — FLUCONAZOLE 150 MG PO TABS: 150.0000 mg | ORAL_TABLET | Freq: Once | ORAL | 0 refills | Status: AC

## 2016-08-18 NOTE — Addendum Note (Signed)
Addended by: Mora Bellman on: 08/18/2016 06:50 AM   Modules accepted: Orders

## 2016-08-20 ENCOUNTER — Telehealth: Payer: Self-pay | Admitting: *Deleted

## 2016-08-20 DIAGNOSIS — B379 Candidiasis, unspecified: Secondary | ICD-10-CM

## 2016-08-20 MED ORDER — FLUCONAZOLE 150 MG PO TABS: 150.0000 mg | ORAL_TABLET | Freq: Once | ORAL | 0 refills | Status: AC

## 2016-08-20 NOTE — Telephone Encounter (Signed)
Informed pt of wet prep result and need for treatment.  Instructed on medication use, pt acknowledged instructions.

## 2016-08-20 NOTE — Telephone Encounter (Signed)
-----   Message from Mora Bellman, MD sent at 08/18/2016  6:51 AM EDT ----- Please inform patient of both yeast and BV infection. Rxs have been e-prescribed  Thanks  [employer]

## 2016-09-07 ENCOUNTER — Encounter: Payer: Self-pay | Admitting: Obstetrics & Gynecology

## 2016-09-07 ENCOUNTER — Ambulatory Visit (INDEPENDENT_AMBULATORY_CARE_PROVIDER_SITE_OTHER): Payer: Medicaid Other | Admitting: Obstetrics & Gynecology

## 2016-09-07 VITALS — BP 124/76 | HR 70 | Wt 133.0 lb

## 2016-09-07 DIAGNOSIS — Z0000 Encounter for general adult medical examination without abnormal findings: Secondary | ICD-10-CM

## 2016-09-07 DIAGNOSIS — Z113 Encounter for screening for infections with a predominantly sexual mode of transmission: Secondary | ICD-10-CM

## 2016-09-07 DIAGNOSIS — N938 Other specified abnormal uterine and vaginal bleeding: Secondary | ICD-10-CM

## 2016-09-07 DIAGNOSIS — Z23 Encounter for immunization: Secondary | ICD-10-CM

## 2016-09-07 NOTE — Progress Notes (Signed)
   Subjective:    Patient ID: Evelyn [name], female    DOB: Jan 05, 1986, 30 y.o.   MRN: [id number]  HPI 30 yo H P4 here for follow up. She was seen by Dr. Elly Modena 08/17/16 with a 2 month h/o irregular bleeding. She was treated for BV and yeast found on a wet prep that day. The bleeding had stopped just before she saw Dr. Elly Modena (on 08-04-16). She has now started back on 921-17 and is still bleeding.   Review of Systems     Objective:   Physical Exam        Assessment & Plan:  DUB- check TSH, cervical cultures, gyn u/s Flu vaccine today

## 2016-09-08 LAB — TSH: TSH: 0.79 m[IU]/L

## 2016-09-10 LAB — GC/CHLAMYDIA PROBE AMP (~~LOC~~) NOT AT ARMC
Chlamydia: NEGATIVE
Neisseria Gonorrhea: NEGATIVE

## 2016-09-14 ENCOUNTER — Ambulatory Visit (HOSPITAL_COMMUNITY)
Admission: RE | Admit: 2016-09-14 | Discharge: 2016-09-14 | Disposition: A | Discharge status: Home / Self Care | Payer: Medicaid Other | Source: Ambulatory Visit | Attending: Obstetrics & Gynecology | Admitting: Obstetrics & Gynecology

## 2016-09-14 DIAGNOSIS — N938 Other specified abnormal uterine and vaginal bleeding: Secondary | ICD-10-CM | POA: Diagnosis present

## 2016-09-17 ENCOUNTER — Encounter (HOSPITAL_COMMUNITY): Payer: Self-pay | Admitting: Emergency Medicine

## 2016-09-17 ENCOUNTER — Ambulatory Visit (HOSPITAL_COMMUNITY)
Admission: EM | Admit: 2016-09-17 | Discharge: 2016-09-17 | Disposition: A | Discharge status: Home / Self Care | Payer: Medicaid Other | Attending: Family Medicine | Admitting: Family Medicine

## 2016-09-17 DIAGNOSIS — J209 Acute bronchitis, unspecified: Secondary | ICD-10-CM | POA: Diagnosis not present

## 2016-09-17 MED ORDER — AMOXICILLIN 875 MG PO TABS: 875.0000 mg | ORAL_TABLET | Freq: Two times a day (BID) | ORAL | 0 refills | Status: DC

## 2016-09-17 MED ORDER — HYDROCODONE-HOMATROPINE 5-1.5 MG/5ML PO SYRP: 5.0000 mL | ORAL_SOLUTION | Freq: Four times a day (QID) | ORAL | 0 refills | Status: DC | PRN

## 2016-09-17 NOTE — ED Triage Notes (Signed)
Patient presents to [facility] today, She states that she has been experiencing Cough and Chest Congestion, Patient denies no fever.

## 2016-09-17 NOTE — ED Provider Notes (Signed)
[facility]    CSN: [id number] Arrival date & time: 09/17/16  1408     History   Chief Complaint Chief Complaint  Patient presents with  . Cough  . Chest Congestion    HPI Evelyn [name] is a 30 y.o. female.   This a 89 year old mother of 3 children who brings in the family because of persistent cough for week. Initial presentation was with her 64-year-old daughter who developed cough and was treated with Prelone. The other 2 boys have asthma and after several days, the mother and 2 sons were also coughing to the point of vomiting. The older of the 2 boys was wheezing.  The boys been using nebulizer but this has not seemed to help with the coughing.  The patient has not had a fever at her cough is nonproductive. She's had a mild sore throat in association with the cough but no earache or shortness of breath.      Past Medical History:  Diagnosis Date  . Allergy   . Depression    post partum  . GERD (gastroesophageal reflux disease)   . HELLP syndrome    2015  . HSV (herpes simplex virus) infection   . Vaginal delivery 2004, 2009, 2011    Patient Active Problem List   Diagnosis Date Noted  . Stress incontinence 12/13/2015  . Encounter for female sterilization procedure 12/13/2015  . Hypertension in pregnancy 08/03/2014  . Preeclampsia 08/03/2014  . Chronic back pain 01/14/2014  . Allergic rhinitis 01/14/2014    Past Surgical History:  Procedure Laterality Date  . CESAREAN SECTION N/A 08/05/2014   Procedure: CESAREAN SECTION;  Surgeon: Jerelyn Charles, MD;  Location: [facility];  Service: Obstetrics;  Laterality: N/A;  . HERNIA REPAIR  2011  . LAPAROSCOPIC BILATERAL SALPINGECTOMY N/A 12/13/2015   Procedure: LAPAROSCOPIC BILATERAL SALPINGECTOMY;  Surgeon: Emily Filbert, MD;  Location: [facility];  Service: Gynecology;  Laterality: N/A;  . PERINEOPLASTY N/A 02/20/2016   Procedure: PERINEOPLASTY;  Surgeon: Emily Filbert, MD;  Location: [facility];  Service: Gynecology;   Laterality: N/A;  . PUBOVAGINAL SLING N/A 12/13/2015   Procedure: Gaynelle Arabian;  Surgeon: Emily Filbert, MD;  Location: [facility];  Service: Gynecology;  Laterality: N/A;  . RECTOCELE REPAIR N/A 02/20/2016   Procedure: POSTERIOR REPAIR (RECTOCELE);  Surgeon: Emily Filbert, MD;  Location: [facility];  Service: Gynecology;  Laterality: N/A;    OB History    Gravida Para Term Preterm AB Living   4 4 2 2  0 4   SAB TAB Ectopic Multiple Live Births   0 0 0 0 4       Home Medications    Prior to Admission medications   Medication Sig Start Date End Date Taking? Authorizing Provider  ACYCLOVIR PO Take 500 mg by mouth daily.    Historical Provider, MD  amoxicillin (AMOXIL) 875 MG tablet Take 1 tablet (875 mg total) by mouth 2 (two) times daily. 09/17/16   Robyn Haber, MD  ibuprofen (ADVIL,MOTRIN) 600 MG tablet Take 1 tablet (600 mg total) by mouth every 6 (six) hours as needed. 02/20/16   Emily Filbert, MD    Family History Family History  Problem Relation Age of Onset  . Asthma Son     Social History Social History  Substance Use Topics  . Smoking status: Never Smoker  . Smokeless tobacco: Never Used  . Alcohol use Yes     Comment: occ     Allergies  Review of patient's allergies indicates no known allergies.   Review of Systems Review of Systems  Constitutional: Negative.   HENT: Positive for sore throat. Negative for congestion.   Eyes: Negative.   Respiratory: Positive for cough, chest tightness and wheezing.   Cardiovascular: Negative for chest pain.  Gastrointestinal: Negative.   Genitourinary: Negative.   Musculoskeletal: Negative.      Physical Exam Triage Vital Signs ED Triage Vitals [09/17/16 1550]  Enc Vitals Group     BP 112/73     Pulse Rate 77     Resp 14     Temp 98.7 F (37.1 C)     Temp Source Oral     SpO2 99 %     Weight      Height      Head Circumference      Peak Flow      Pain Score      Pain Loc      Pain Edu?      Excl. in [place]?     No data found.   Updated Vital Signs BP 112/73 (BP Location: Right Arm)   Pulse 77   Temp 98.7 F (37.1 C) (Oral)   Resp 14   LMP 08/23/2016   SpO2 99%   Visual Acuity Right Eye Distance:   Left Eye Distance:   Bilateral Distance:    Right Eye Near:   Left Eye Near:    Bilateral Near:     Physical Exam  Constitutional: She is oriented to person, place, and time. She appears well-developed and well-nourished.  HENT:  Head: Normocephalic and atraumatic.  Right Ear: External ear normal.  Left Ear: External ear normal.  Mouth/Throat: Oropharynx is clear and moist.  Eyes: Conjunctivae and EOM are normal. Pupils are equal, round, and reactive to light.  Neck: Normal range of motion. Neck supple.  Cardiovascular: Normal rate, regular rhythm and normal heart sounds.   Pulmonary/Chest: Effort normal.  Bilateral rhonchi  Musculoskeletal: Normal range of motion.  Neurological: She is alert and oriented to person, place, and time.  Skin: Skin is warm and dry.  Psychiatric: She has a normal mood and affect. Her behavior is normal.  Nursing note and vitals reviewed.    UC Treatments / Results  Labs (all labs ordered are listed, but only abnormal results are displayed) Labs Reviewed - No data to display  EKG  EKG Interpretation None       Radiology No results found.  Procedures Procedures (including critical care time)  Medications Ordered in UC Medications - No data to display   Initial Impression / Assessment and Plan / UC Course  I have reviewed the triage vital signs and the nursing notes.  Pertinent labs & imaging results that were available during my care of the patient were reviewed by me and considered in my medical decision making (see chart for details).  Clinical Course      Final Clinical Impressions(s) / UC Diagnoses   Final diagnoses:  Acute bronchitis, unspecified organism    New Prescriptions Current Discharge Medication List     START taking these medications   Details  amoxicillin (AMOXIL) 875 MG tablet Take 1 tablet (875 mg total) by mouth 2 (two) times daily. Qty: 20 tablet, Refills: 0         Robyn Haber, MD 09/17/16 1623

## 2016-10-05 ENCOUNTER — Encounter: Payer: Self-pay | Admitting: Obstetrics & Gynecology

## 2016-10-05 ENCOUNTER — Ambulatory Visit (INDEPENDENT_AMBULATORY_CARE_PROVIDER_SITE_OTHER): Payer: Medicaid Other | Admitting: Obstetrics & Gynecology

## 2016-10-05 VITALS — BP 117/70 | HR 59 | Resp 18 | Ht 61.0 in | Wt 132.0 lb

## 2016-10-05 DIAGNOSIS — N938 Other specified abnormal uterine and vaginal bleeding: Secondary | ICD-10-CM

## 2016-10-05 NOTE — Progress Notes (Signed)
   Subjective:    Patient ID: Evelyn [name], female    DOB: 1986/07/27, 30 y.o.   MRN: [id number]  HPI 30 yo MH P4 here for follow up of u/s and TSH done for evaluation of DUB. The tests were normal. She has had a BTL and A&P repair in the past. She had stopped bleeding 09-03-16 and started again on 10-01-16. It is still rather heavy as of today.  She used Mirena in the past and had very little bleeding.   Review of Systems     Objective:   Physical Exam WNWHHFNAD Breathing, conversing, and ambulating normal       Assessment & Plan:  DUB- She will fill out the [employer] scholarship paperwork RTC when free Mirena available

## 2017-02-07 ENCOUNTER — Ambulatory Visit (INDEPENDENT_AMBULATORY_CARE_PROVIDER_SITE_OTHER): Payer: Self-pay | Admitting: Obstetrics & Gynecology

## 2017-02-07 ENCOUNTER — Encounter: Payer: Self-pay | Admitting: Obstetrics & Gynecology

## 2017-02-07 VITALS — BP 110/73 | HR 65 | Wt 128.0 lb

## 2017-02-07 DIAGNOSIS — Z3202 Encounter for pregnancy test, result negative: Secondary | ICD-10-CM | POA: Diagnosis not present

## 2017-02-07 DIAGNOSIS — Z30430 Encounter for insertion of intrauterine contraceptive device: Secondary | ICD-10-CM

## 2017-02-07 LAB — POCT URINE PREGNANCY: PREG TEST UR: NEGATIVE

## 2017-02-07 MED ORDER — VALACYCLOVIR HCL 1 G PO TABS: 500.0000 mg | ORAL_TABLET | Freq: Every day | ORAL | 12 refills | Status: AC

## 2017-02-07 MED ORDER — LEVONORGESTREL 18.6 MCG/DAY IU IUD
INTRAUTERINE_SYSTEM | Freq: Once | INTRAUTERINE | Status: AC
  Administered 2017-02-07: 10:00:00 via INTRAUTERINE

## 2017-02-07 NOTE — Progress Notes (Signed)
   Subjective:    Patient ID: Evelyn [name], female    DOB: [birth year], 31 y.o.   MRN: [id number]  HPI  31 yo H P4 here for Mirena insertion. She has DUB and has elected Mirena for her treatment. She used Mirena in the past and had very little bleeding.  Review of Systems She had a BTL in 2017.    Objective:   Physical Exam Pleasant WNWHHFNAD Breathing, conversing, and ambulating normally UPT negative, consent signed, Time out procedure done. Cervix prepped with betadine and grasped with a single tooth tenaculum. Liletta was easily placed and the strings were cut to 3-4 cm. Uterus sounded to 9 cm. She tolerated the procedure well.      Assessment & Plan:  DUB- Liletta RTC 3 months

## 2017-02-07 NOTE — Addendum Note (Signed)
Addended by: Lyndal Rainbow on: 02/07/2017 10:24 AM   Modules accepted: Orders

## 2017-09-06 ENCOUNTER — Ambulatory Visit: Payer: BLUE CROSS/BLUE SHIELD | Admitting: Obstetrics & Gynecology

## 2018-04-09 ENCOUNTER — Other Ambulatory Visit: Payer: Self-pay

## 2018-08-29 ENCOUNTER — Ambulatory Visit (INDEPENDENT_AMBULATORY_CARE_PROVIDER_SITE_OTHER): Payer: Medicaid Other | Admitting: Orthopaedic Surgery

## 2018-08-29 DIAGNOSIS — G5601 Carpal tunnel syndrome, right upper limb: Secondary | ICD-10-CM

## 2018-08-29 NOTE — Progress Notes (Signed)
Office Visit Note   Patient: Evelyn [name]           Date of Birth: 1986/06/16           MRN: [id number] Visit Date: 08/29/2018              Requested by: No referring provider defined for this encounter. PCP: System, Provider Not In   Assessment & Plan: Visit Diagnoses:  1. Right carpal tunnel syndrome     Plan: Impression is right carpal tunnel syndrome.  Symptoms are reminiscent of her left hand which she had complete resolution of after the release.  For now we will going to put her in a nighttime carpal tunnel splint.  She would like to proceed with right carpal tunnel release in the near future.  Risks and benefits and rehab and recovery reviewed.  Follow-Up Instructions: Return if symptoms worsen or fail to improve.   Orders:  No orders of the defined types were placed in this encounter.  No orders of the defined types were placed in this encounter.     Procedures: No procedures performed   Clinical Data: No additional findings.   Subjective: Chief Complaint  Patient presents with  . Right Hand - Pain    Sharlee Blew is a 32 year old female who I previously treated for left carpal tunnel syndrome which she underwent carpal tunnel release in March 2017.  She did well from the surgery and she had complete resolution of her symptoms despite a normal nerve conduction study.  She has now recently had worsening in her right hand with symptoms reminiscent of her left carpal tunnel syndrome.  She denies any radicular symptoms.  She does type a lot for living.  She denies any injuries.   Review of Systems  Constitutional: Negative.   HENT: Negative.   Eyes: Negative.   Respiratory: Negative.   Cardiovascular: Negative.   Endocrine: Negative.   Musculoskeletal: Negative.   Neurological: Negative.   Hematological: Negative.   Psychiatric/Behavioral: Negative.   All other systems reviewed and are negative.    Objective: Vital Signs: There were no vitals taken  for this visit.  Physical Exam  Constitutional: She is oriented to person, place, and time. She appears well-developed and well-nourished.  Pulmonary/Chest: Effort normal.  Neurological: She is alert and oriented to person, place, and time.  Skin: Skin is warm. Capillary refill takes less than 2 seconds.  Psychiatric: She has a normal mood and affect. Her behavior is normal. Judgment and thought content normal.  Nursing note and vitals reviewed.   Ortho Exam Right hand exam shows mildly positive carpal tunnel compressive signs.  No muscle atrophy. Specialty Comments:  No specialty comments available.  Imaging: No results found.   PMFS History: Patient Active Problem List   Diagnosis Date Noted  . Stress incontinence 12/13/2015  . Encounter for female sterilization procedure 12/13/2015  . Hypertension in pregnancy 08/03/2014  . Preeclampsia 08/03/2014  . Chronic back pain 01/14/2014  . Allergic rhinitis 01/14/2014   Past Medical History:  Diagnosis Date  . Allergy   . Depression    post partum  . GERD (gastroesophageal reflux disease)   . HELLP syndrome    2015  . HSV (herpes simplex virus) infection   . Vaginal delivery 2004, 2009, 2011    Family History  Problem Relation Age of Onset  . Asthma Son     Past Surgical History:  Procedure Laterality Date  . CESAREAN SECTION N/A 08/05/2014   Procedure:  CESAREAN SECTION;  Surgeon: Jerelyn Charles, MD;  Location: [facility];  Service: Obstetrics;  Laterality: N/A;  . HERNIA REPAIR  2011  . LAPAROSCOPIC BILATERAL SALPINGECTOMY N/A 12/13/2015   Procedure: LAPAROSCOPIC BILATERAL SALPINGECTOMY;  Surgeon: Emily Filbert, MD;  Location: [facility];  Service: Gynecology;  Laterality: N/A;  . PERINEOPLASTY N/A 02/20/2016   Procedure: PERINEOPLASTY;  Surgeon: Emily Filbert, MD;  Location: [facility];  Service: Gynecology;  Laterality: N/A;  . PUBOVAGINAL SLING N/A 12/13/2015   Procedure: Gaynelle Arabian;  Surgeon: Emily Filbert, MD;  Location: [facility];  Service: Gynecology;  Laterality: N/A;  . RECTOCELE REPAIR N/A 02/20/2016   Procedure: POSTERIOR REPAIR (RECTOCELE);  Surgeon: Emily Filbert, MD;  Location: [facility];  Service: Gynecology;  Laterality: N/A;   Social History   Occupational History  . Occupation: Not Employed  Tobacco Use  . Smoking status: Never Smoker  . Smokeless tobacco: Never Used  Substance and Sexual Activity  . Alcohol use: Yes    Comment: occ  . Drug use: No  . Sexual activity: Yes    Partners: Male    Birth control/protection: Surgical

## 2018-09-05 ENCOUNTER — Encounter (INDEPENDENT_AMBULATORY_CARE_PROVIDER_SITE_OTHER): Payer: Self-pay | Admitting: Family Medicine

## 2018-09-05 ENCOUNTER — Ambulatory Visit (INDEPENDENT_AMBULATORY_CARE_PROVIDER_SITE_OTHER): Payer: Medicaid Other

## 2018-09-05 ENCOUNTER — Ambulatory Visit (INDEPENDENT_AMBULATORY_CARE_PROVIDER_SITE_OTHER): Payer: Medicaid Other | Admitting: Family Medicine

## 2018-09-05 VITALS — Ht 61.0 in | Wt 128.0 lb

## 2018-09-05 DIAGNOSIS — M542 Cervicalgia: Secondary | ICD-10-CM

## 2018-09-05 MED ORDER — METHYLPREDNISOLONE 4 MG PO TBPK: ORAL_TABLET | ORAL | 0 refills | Status: DC

## 2018-09-05 MED ORDER — TIZANIDINE HCL 2 MG PO TABS: 2.0000 mg | ORAL_TABLET | Freq: Four times a day (QID) | ORAL | 1 refills | Status: DC | PRN

## 2018-09-05 MED ORDER — VITAMIN D-3 125 MCG (5000 UT) PO TABS: 1.0000 | ORAL_TABLET | Freq: Every day | ORAL | 3 refills | Status: DC

## 2018-09-05 NOTE — Progress Notes (Signed)
Office Visit Note   Patient: Evelyn [name]           Date of Birth: [birth year]           MRN: [id number] Visit Date: 09/05/2018 Requested by: No referring provider defined for this encounter. PCP: System, Provider Not In  Subjective: Chief Complaint  Patient presents with  . Neck - Pain    Patient c/o of sharp pain radiating from her right side of  neck to right arm; pain is severe and came in to see if she could possibly have pitch nerve damage.  . Right Arm - Pain    HPI: She is a 32 year old with neck and right arm pain.  She has history of carpal tunnel syndrome diagnosed by nerve conduction studies.  I could not find a report in the chart, but she has had left carpal tunnel release.  She started developing worsening right arm symptoms this year and is scheduled for carpal tunnel release but in the past week she has developed worsening symptoms with pain in the shoulder blade and neck.  She also had a pressure sensation in her right eye and a couple days ago, some numbness in her right leg.  She went to orthopedic urgent care yesterday but was told to come here.  She thought she had had neck x-rays done but I cannot find any in the chart.              ROS: Otherwise noncontributory.  She does have a history of vitamin D deficiency but is not on anything for it.  Objective: Vital Signs: Ht 5\' 1"  (1.549 m)   Wt 128 lb (58.1 kg)   BMI 24.19 kg/m   Physical Exam:  Neck: Full range of motion with negative Spurling's test.  Upper extremity strength and reflexes are normal.  Tender trigger point in the right rhomboid area that seems to reproduce her pain.  Tender trigger point to the right of C3-4 as well.  No thenar atrophy.  Intrinsic hand strength is normal.  Mildly positive Tinel's at the carpal tunnel.  Imaging: 2 views cervical spine x-rays: She has reversed cervical curve, otherwise disc spaces are well-preserved.  No acute abnormality.  Assessment & Plan: 1.  Neck and  right arm pain with paresthesias, etiology uncertain.  Cannot rule out cervical disc protrusion. -MRI cervical spine.  Medrol Dosepak, muscle relaxant as needed.  Follow-up afterward to go over the results. -Start taking vitamin D3 long-term.   Follow-Up Instructions: No follow-ups on file.       Procedures: None today.   PMFS History: Patient Active Problem List   Diagnosis Date Noted  . Stress incontinence 12/13/2015  . Encounter for female sterilization procedure 12/13/2015  . Hypertension in pregnancy 08/03/2014  . Preeclampsia 08/03/2014  . Chronic back pain 01/14/2014  . Allergic rhinitis 01/14/2014   Past Medical History:  Diagnosis Date  . Allergy   . Depression    post partum  . GERD (gastroesophageal reflux disease)   . HELLP syndrome    2015  . HSV (herpes simplex virus) infection   . Vaginal delivery 2004, 2009, 2011    Family History  Problem Relation Age of Onset  . Asthma Son     Past Surgical History:  Procedure Laterality Date  . CESAREAN SECTION N/A 08/05/2014   Procedure: CESAREAN SECTION;  Surgeon: Jerelyn Charles, MD;  Location: [facility];  Service: Obstetrics;  Laterality: N/A;  . HERNIA REPAIR  2011  .  LAPAROSCOPIC BILATERAL SALPINGECTOMY N/A 12/13/2015   Procedure: LAPAROSCOPIC BILATERAL SALPINGECTOMY;  Surgeon: Emily Filbert, MD;  Location: [facility];  Service: Gynecology;  Laterality: N/A;  . PERINEOPLASTY N/A 02/20/2016   Procedure: PERINEOPLASTY;  Surgeon: Emily Filbert, MD;  Location: [facility];  Service: Gynecology;  Laterality: N/A;  . PUBOVAGINAL SLING N/A 12/13/2015   Procedure: Gaynelle Arabian;  Surgeon: Emily Filbert, MD;  Location: [facility];  Service: Gynecology;  Laterality: N/A;  . RECTOCELE REPAIR N/A 02/20/2016   Procedure: POSTERIOR REPAIR (RECTOCELE);  Surgeon: Emily Filbert, MD;  Location: [facility];  Service: Gynecology;  Laterality: N/A;   Social History   Occupational History  . Occupation: Not Employed  Tobacco Use  . Smoking status: Never  Smoker  . Smokeless tobacco: Never Used  Substance and Sexual Activity  . Alcohol use: Yes    Comment: occ  . Drug use: No  . Sexual activity: Yes    Partners: Male    Birth control/protection: Surgical

## 2018-09-05 NOTE — Addendum Note (Signed)
Addended by: Hortencia Pilar on: 09/05/2018 01:35 PM   Modules accepted: Orders

## 2018-09-07 ENCOUNTER — Encounter

## 2018-09-08 ENCOUNTER — Emergency Department (HOSPITAL_COMMUNITY)
Admission: EM | Admit: 2018-09-08 | Discharge: 2018-09-08 | Disposition: A | Discharge status: Home / Self Care | Payer: Medicaid Other | Attending: Emergency Medicine | Admitting: Emergency Medicine

## 2018-09-08 ENCOUNTER — Encounter (HOSPITAL_COMMUNITY): Payer: Self-pay | Admitting: Emergency Medicine

## 2018-09-08 ENCOUNTER — Other Ambulatory Visit: Payer: Self-pay

## 2018-09-08 ENCOUNTER — Emergency Department (HOSPITAL_COMMUNITY): Payer: Medicaid Other

## 2018-09-08 DIAGNOSIS — H538 Other visual disturbances: Secondary | ICD-10-CM | POA: Insufficient documentation

## 2018-09-08 DIAGNOSIS — Z79899 Other long term (current) drug therapy: Secondary | ICD-10-CM | POA: Insufficient documentation

## 2018-09-08 DIAGNOSIS — M25511 Pain in right shoulder: Secondary | ICD-10-CM | POA: Diagnosis not present

## 2018-09-08 DIAGNOSIS — R202 Paresthesia of skin: Secondary | ICD-10-CM

## 2018-09-08 DIAGNOSIS — M25551 Pain in right hip: Secondary | ICD-10-CM | POA: Diagnosis not present

## 2018-09-08 DIAGNOSIS — M542 Cervicalgia: Secondary | ICD-10-CM | POA: Diagnosis not present

## 2018-09-08 DIAGNOSIS — R51 Headache: Secondary | ICD-10-CM | POA: Insufficient documentation

## 2018-09-08 LAB — I-STAT BETA HCG BLOOD, ED (MC, WL, AP ONLY)

## 2018-09-08 LAB — CBC
HEMATOCRIT: 37.9 % (ref 36.0–46.0)
HEMOGLOBIN: 12.4 g/dL (ref 12.0–15.0)
MCH: 29.5 pg (ref 26.0–34.0)
MCHC: 32.7 g/dL (ref 30.0–36.0)
MCV: 90 fL (ref 78.0–100.0)
Platelets: 315 10*3/uL (ref 150–400)
RBC: 4.21 MIL/uL (ref 3.87–5.11)
RDW: 12.4 % (ref 11.5–15.5)
WBC: 13.5 10*3/uL — ABNORMAL HIGH (ref 4.0–10.5)

## 2018-09-08 LAB — BASIC METABOLIC PANEL
ANION GAP: 8 (ref 5–15)
BUN: 13 mg/dL (ref 6–20)
CO2: 25 mmol/L (ref 22–32)
Calcium: 9.5 mg/dL (ref 8.9–10.3)
Chloride: 103 mmol/L (ref 98–111)
Creatinine, Ser: 0.64 mg/dL (ref 0.44–1.00)
GFR calc Af Amer: >60 mL/min (ref >60)
GFR calc non Af Amer: >60 mL/min (ref >60)
GLUCOSE: 131 mg/dL — AB (ref 70–99)
POTASSIUM: 3.6 mmol/L (ref 3.5–5.1)
Sodium: 136 mmol/L (ref 135–145)

## 2018-09-08 LAB — URINALYSIS, ROUTINE W REFLEX MICROSCOPIC
BACTERIA UA: NONE SEEN
Bilirubin Urine: NEGATIVE
GLUCOSE, UA: NEGATIVE mg/dL
Ketones, ur: NEGATIVE mg/dL
Leukocytes, UA: NEGATIVE
Nitrite: NEGATIVE
PH: 5 (ref 5.0–8.0)
Protein, ur: NEGATIVE mg/dL
SPECIFIC GRAVITY, URINE: 1.018 (ref 1.005–1.030)

## 2018-09-08 LAB — CBG MONITORING, ED: Glucose-Capillary: 64 mg/dL — ABNORMAL LOW (ref 70–99)

## 2018-09-08 MED ORDER — IBUPROFEN 400 MG PO TABS: 400.0000 mg | ORAL_TABLET | Freq: Three times a day (TID) | ORAL | 0 refills | Status: AC

## 2018-09-08 MED ORDER — ACETAMINOPHEN 500 MG PO TABS
1000.0000 mg | ORAL_TABLET | Freq: Once | ORAL | Status: AC
  Administered 2018-09-08: 1000 mg via ORAL
  Filled 2018-09-08: qty 2

## 2018-09-08 MED ORDER — GADOBUTROL 1 MMOL/ML IV SOLN
5.0000 mL | Freq: Once | INTRAVENOUS | Status: AC | PRN
  Administered 2018-09-08: 5 mL via INTRAVENOUS

## 2018-09-08 NOTE — ED Triage Notes (Addendum)
States that she has been having right neck pain and shoulder/arm pain since last week. Last Thursday started having numbness in left leg- saw orthopedist on Friday, prescribed steroids and muscle relaxer- having burning sensation with numbness and tingling- has an Mri scheduled for Friday by [facility].  Also causing blurry vision in rihgt eye and bad headache

## 2018-09-08 NOTE — Discharge Instructions (Addendum)
Evelyn [name],   It was a pleasure taking care of you here in the ED today.  As I discussed with you the MRI of your brain and your spine were essentially negative and showed only a small outpouching of the disc in your cervical spine.  Please follow-up with orthopedic doctor and call the number on this discharge summary to make an appointment with neurology.  ~Take Care Dr. Eileen Stanford

## 2018-09-08 NOTE — ED Provider Notes (Signed)
[facility] EMERGENCY DEPARTMENT Provider Note   CSN: [id number] Arrival date & time: 09/08/18  1113     History   Chief Complaint Chief Complaint  Patient presents with  . Numbness    HPI Evelyn [name] is a 32 y.o. female with medical history significant for carpal tunnel syndrome presenting for an evaluation of right-sided numbness.  She states that about 2 weeks ago she began noticing pain and numbness at the back of her neck which gradually progressed to the right shoulder and subsequently to the right lower extremity.  She also reports of tingling and states that these symptoms are typically worse at night.  Since onset, she has experienced blurry vision on the right side which she thought was due to her old prescription glasses but the symptoms persisted after she changed her glasses.  She also reports of right-sided headache and an episode of night sweats but denies pain at the right temple, lightheadedness, dizziness, fevers, chills, palpitation, dysrhythmia, weight loss, myalgia, nausea, vomiting, chest pain, shortness of breath or neck mass.  She has chronic pain in the joints of both hands which she thought was secondary to her carpal tunnel disease.  She also reports that there have been instances where she will drop objects on normally due to numbness.  She also complains of right hip pain which started this morning and has limited her mobility.  Of note, she was recently evaluated by orthopedics and initially scheduled for an MRI cervical neck this Friday.  After being evaluated by orthopedics she was discharged with methylprednisolone and a muscle relaxer.   Past Medical History:  Diagnosis Date  . Allergy   . Depression    post partum  . GERD (gastroesophageal reflux disease)   . HELLP syndrome    2015  . HSV (herpes simplex virus) infection   . Vaginal delivery 2004, 2009, 2011    Patient Active Problem List   Diagnosis Date Noted  .  Stress incontinence 12/13/2015  . Encounter for female sterilization procedure 12/13/2015  . Hypertension in pregnancy 08/03/2014  . Preeclampsia 08/03/2014  . Chronic back pain 01/14/2014  . Allergic rhinitis 01/14/2014    Past Surgical History:  Procedure Laterality Date  . CESAREAN SECTION N/A 08/05/2014   Procedure: CESAREAN SECTION;  Surgeon: Jerelyn Charles, MD;  Location: [facility];  Service: Obstetrics;  Laterality: N/A;  . HERNIA REPAIR  2011  . LAPAROSCOPIC BILATERAL SALPINGECTOMY N/A 12/13/2015   Procedure: LAPAROSCOPIC BILATERAL SALPINGECTOMY;  Surgeon: Emily Filbert, MD;  Location: [facility];  Service: Gynecology;  Laterality: N/A;  . PERINEOPLASTY N/A 02/20/2016   Procedure: PERINEOPLASTY;  Surgeon: Emily Filbert, MD;  Location: [facility];  Service: Gynecology;  Laterality: N/A;  . PUBOVAGINAL SLING N/A 12/13/2015   Procedure: Gaynelle Arabian;  Surgeon: Emily Filbert, MD;  Location: [facility];  Service: Gynecology;  Laterality: N/A;  . RECTOCELE REPAIR N/A 02/20/2016   Procedure: POSTERIOR REPAIR (RECTOCELE);  Surgeon: Emily Filbert, MD;  Location: [facility];  Service: Gynecology;  Laterality: N/A;     OB History    Gravida  4   Para  4   Term  2   Preterm  2   AB  0   Living  4     SAB  0   TAB  0   Ectopic  0   Multiple  0   Live Births  4            Home  Medications    Prior to Admission medications   Medication Sig Start Date End Date Taking? Authorizing Provider  Cholecalciferol (VITAMIN D-3) 5000 units TABS Take 1 tablet by mouth daily. 09/05/18  Yes Hilts, Legrand Como, MD  levonorgestrel (MIRENA) 20 MCG/24HR IUD 1 each by Intrauterine route once. 04/02/17  Yes [provider]  methylPREDNISolone (MEDROL DOSEPAK) 4 MG TBPK tablet As directed for 6 days. Patient taking differently: Take 4-24 mg by mouth See admin instructions. Take 6,5,4,3,2,1 09/05/18  Yes Hilts, Legrand Como, MD  tiZANidine (ZANAFLEX) 2 MG tablet Take 1-2 tablets (2-4 mg total) by mouth every 6 (six)  hours as needed for muscle spasms. 09/05/18  Yes Hilts, Legrand Como, MD  valACYclovir (VALTREX) 1000 MG tablet Take 500 mg by mouth daily as needed (outbreak).    Yes [provider]  ibuprofen (ADVIL,MOTRIN) 400 MG tablet Take 1 tablet (400 mg total) by mouth 3 (three) times daily for 4 days. 09/08/18 09/12/18  Jean Rosenthal, MD    Family History Family History  Problem Relation Age of Onset  . Asthma Son     Social History Social History   Tobacco Use  . Smoking status: Never Smoker  . Smokeless tobacco: Never Used  Substance Use Topics  . Alcohol use: Yes    Comment: occ  . Drug use: No     Allergies   Patient has no known allergies.   Review of Systems Review of Systems  Constitutional: Negative.   HENT: Negative.   Eyes: Positive for visual disturbance. Negative for photophobia, pain, discharge, redness and itching.  Respiratory: Negative.   Cardiovascular: Negative.   Gastrointestinal: Negative.   Endocrine: Negative.   Musculoskeletal: Positive for arthralgias and neck pain. Negative for gait problem, joint swelling, myalgias and neck stiffness.  Skin: Negative.   Neurological: Positive for numbness and headaches. Negative for light-headedness.  Psychiatric/Behavioral: Negative.      Physical Exam Updated Vital Signs BP 106/64   Pulse 70   Temp 98.6 F (37 C) (Oral)   Resp 16   Ht 5\' 1"  (1.549 m)   Wt 58.1 kg   SpO2 96%   BMI 24.19 kg/m   Physical Exam  Constitutional: She appears well-developed and well-nourished. No distress.  HENT:  Head: Normocephalic and atraumatic.  Eyes: Pupils are equal, round, and reactive to light. Conjunctivae and EOM are normal. Right eye exhibits no discharge. Left eye exhibits no discharge. No scleral icterus.  Neck: Normal range of motion. Neck supple. No thyromegaly present.  Cardiovascular: Normal rate, regular rhythm and normal heart sounds.  No murmur heard. Pulmonary/Chest: Effort normal and breath sounds  normal.  Abdominal: Soft. Bowel sounds are normal.  Musculoskeletal: Normal range of motion. She exhibits no tenderness.  Neurological: She is alert. She has normal strength. She is not disoriented. No cranial nerve deficit or sensory deficit. Coordination normal.  Skin: Skin is warm. She is not diaphoretic.  Psychiatric: She has a normal mood and affect.     ED Treatments / Results  Labs (all labs ordered are listed, but only abnormal results are displayed) Labs Reviewed  BASIC METABOLIC PANEL - Abnormal; Notable for the following components:      Result Value   Glucose, Bld 131 (*)    All other components within normal limits  CBC - Abnormal; Notable for the following components:   WBC 13.5 (*)    All other components within normal limits  URINALYSIS, ROUTINE W REFLEX MICROSCOPIC - Abnormal; Notable for the following components:  Hgb urine dipstick MODERATE (*)    All other components within normal limits  CBG MONITORING, ED - Abnormal; Notable for the following components:   Glucose-Capillary 64 (*)    All other components within normal limits  I-STAT BETA HCG BLOOD, ED ([facility], [facility], [facility] ONLY)    EKG None  Radiology Mr Brain Wo Contrast  Result Date: 09/08/2018 CLINICAL DATA:  Right neck, shoulder, and arm pain since last week. Left leg numbness. Right eye blurred vision and headache. EXAM: MRI HEAD WITHOUT CONTRAST MRI CERVICAL SPINE WITHOUT AND WITH CONTRAST MRI THORACIC SPINE WITHOUT CONTRAST TECHNIQUE: Multiplanar, multiecho pulse sequences of the brain and thoracic spine were obtained without intravenous contrast. Multiplanar, multiecho pulse sequences of the cervical spine, to include the craniocervical junction and cervicothoracic junction, were obtained without and with intravenous contrast. CONTRAST:  5 mL Gadavist COMPARISON:  Cervical spine radiographs 09/05/2018 FINDINGS: MRI HEAD FINDINGS Brain: There is no evidence of acute infarct, intracranial hemorrhage, mass,  midline shift, or extra-axial fluid collection. The ventricles and sulci are normal. The brain is normal in signal. Vascular: Major intracranial vascular flow voids are preserved. Skull and upper cervical spine: Unremarkable calvarial bone marrow signal. Sinuses/Orbits: Unremarkable orbits. Paranasal sinuses and mastoid air cells are clear. Other: None. MRI CERVICAL SPINE FINDINGS Alignment: Cervical spine straightening.  No listhesis. Vertebrae: No fracture, suspicious osseous lesion, or significant marrow edema. Mildly diminished bone marrow T1 signal intensity diffusely, nonspecific though can be seen with anemia and smoking. Cord: No abnormal normal cord signal and morphology. No abnormal intradural enhancement. Posterior Fossa, vertebral arteries, paraspinal tissues: Unremarkable. Disc levels: Intervertebral disc space heights are preserved. There is a diminutive central disc protrusion at C5-6 without stenosis or significant spinal cord mass effect. The other disc levels are unremarkable. MRI THORACIC SPINE FINDINGS Alignment:  Normal. Vertebrae: No fracture, suspicious osseous lesion, or significant marrow edema. Mildly diminished bone marrow T1 signal intensity diffusely as described above. Cord:  Normal signal and morphology. Paraspinal and other soft tissues: Unremarkable. Disc levels: Disc height and hydration are preserved throughout the thoracic spine. No disc herniation is identified, and the spinal canal and neural foramina are widely patent. IMPRESSION: 1. Negative brain MRI. 2. Tiny C5-6 central disc protrusion without stenosis. 3. Negative thoracic spine MRI. Electronically Signed   By: Logan Bores M.D.   On: 09/08/2018 21:26   Mr Thoracic Spine Wo Contrast  Result Date: 09/08/2018 CLINICAL DATA:  Right neck, shoulder, and arm pain since last week. Left leg numbness. Right eye blurred vision and headache. EXAM: MRI HEAD WITHOUT CONTRAST MRI CERVICAL SPINE WITHOUT AND WITH CONTRAST MRI THORACIC  SPINE WITHOUT CONTRAST TECHNIQUE: Multiplanar, multiecho pulse sequences of the brain and thoracic spine were obtained without intravenous contrast. Multiplanar, multiecho pulse sequences of the cervical spine, to include the craniocervical junction and cervicothoracic junction, were obtained without and with intravenous contrast. CONTRAST:  5 mL Gadavist COMPARISON:  Cervical spine radiographs 09/05/2018 FINDINGS: MRI HEAD FINDINGS Brain: There is no evidence of acute infarct, intracranial hemorrhage, mass, midline shift, or extra-axial fluid collection. The ventricles and sulci are normal. The brain is normal in signal. Vascular: Major intracranial vascular flow voids are preserved. Skull and upper cervical spine: Unremarkable calvarial bone marrow signal. Sinuses/Orbits: Unremarkable orbits. Paranasal sinuses and mastoid air cells are clear. Other: None. MRI CERVICAL SPINE FINDINGS Alignment: Cervical spine straightening.  No listhesis. Vertebrae: No fracture, suspicious osseous lesion, or significant marrow edema. Mildly diminished bone marrow T1 signal intensity diffusely, nonspecific though can  be seen with anemia and smoking. Cord: No abnormal normal cord signal and morphology. No abnormal intradural enhancement. Posterior Fossa, vertebral arteries, paraspinal tissues: Unremarkable. Disc levels: Intervertebral disc space heights are preserved. There is a diminutive central disc protrusion at C5-6 without stenosis or significant spinal cord mass effect. The other disc levels are unremarkable. MRI THORACIC SPINE FINDINGS Alignment:  Normal. Vertebrae: No fracture, suspicious osseous lesion, or significant marrow edema. Mildly diminished bone marrow T1 signal intensity diffusely as described above. Cord:  Normal signal and morphology. Paraspinal and other soft tissues: Unremarkable. Disc levels: Disc height and hydration are preserved throughout the thoracic spine. No disc herniation is identified, and the  spinal canal and neural foramina are widely patent. IMPRESSION: 1. Negative brain MRI. 2. Tiny C5-6 central disc protrusion without stenosis. 3. Negative thoracic spine MRI. Electronically Signed   By: Logan Bores M.D.   On: 09/08/2018 21:26   Mr Cervical Spine W Wo Contrast  Result Date: 09/08/2018 CLINICAL DATA:  Right neck, shoulder, and arm pain since last week. Left leg numbness. Right eye blurred vision and headache. EXAM: MRI HEAD WITHOUT CONTRAST MRI CERVICAL SPINE WITHOUT AND WITH CONTRAST MRI THORACIC SPINE WITHOUT CONTRAST TECHNIQUE: Multiplanar, multiecho pulse sequences of the brain and thoracic spine were obtained without intravenous contrast. Multiplanar, multiecho pulse sequences of the cervical spine, to include the craniocervical junction and cervicothoracic junction, were obtained without and with intravenous contrast. CONTRAST:  5 mL Gadavist COMPARISON:  Cervical spine radiographs 09/05/2018 FINDINGS: MRI HEAD FINDINGS Brain: There is no evidence of acute infarct, intracranial hemorrhage, mass, midline shift, or extra-axial fluid collection. The ventricles and sulci are normal. The brain is normal in signal. Vascular: Major intracranial vascular flow voids are preserved. Skull and upper cervical spine: Unremarkable calvarial bone marrow signal. Sinuses/Orbits: Unremarkable orbits. Paranasal sinuses and mastoid air cells are clear. Other: None. MRI CERVICAL SPINE FINDINGS Alignment: Cervical spine straightening.  No listhesis. Vertebrae: No fracture, suspicious osseous lesion, or significant marrow edema. Mildly diminished bone marrow T1 signal intensity diffusely, nonspecific though can be seen with anemia and smoking. Cord: No abnormal normal cord signal and morphology. No abnormal intradural enhancement. Posterior Fossa, vertebral arteries, paraspinal tissues: Unremarkable. Disc levels: Intervertebral disc space heights are preserved. There is a diminutive central disc protrusion at C5-6  without stenosis or significant spinal cord mass effect. The other disc levels are unremarkable. MRI THORACIC SPINE FINDINGS Alignment:  Normal. Vertebrae: No fracture, suspicious osseous lesion, or significant marrow edema. Mildly diminished bone marrow T1 signal intensity diffusely as described above. Cord:  Normal signal and morphology. Paraspinal and other soft tissues: Unremarkable. Disc levels: Disc height and hydration are preserved throughout the thoracic spine. No disc herniation is identified, and the spinal canal and neural foramina are widely patent. IMPRESSION: 1. Negative brain MRI. 2. Tiny C5-6 central disc protrusion without stenosis. 3. Negative thoracic spine MRI. Electronically Signed   By: Logan Bores M.D.   On: 09/08/2018 21:26    Procedures Procedures (including critical care time)  Medications Ordered in ED Medications  acetaminophen (TYLENOL) tablet 1,000 mg (1,000 mg Oral Given 09/08/18 1739)  gadobutrol (GADAVIST) 1 MMOL/ML injection 5 mL (5 mLs Intravenous Contrast Given 09/08/18 2108)     Initial Impression / Assessment and Plan / ED Course  I have reviewed the triage vital signs and the nursing notes.  Pertinent labs & imaging results that were available during my care of the patient were reviewed by me and considered in my medical decision  making (see chart for details).   32 year old otherwise healthy woman presenting with paresthesia of right neck right shoulder right arm which is gradually progressed to the right lower extremity.  In the interim she has experienced blurred vision, burning sensation of the right shoulder, ongoing numbness and tingling in the back of her neck as well as her right digits.  Differential diagnosis for this lady include cervical spine pathology, multiple sclerosis, intracranial processes.   On re-evaluation, she complained of ongoing right hip pain and received a 1 time dose of Tylenol 1000mg .  MRI brain, cervical spine and thoracic  spine revealed: 1. Negative brain MRI. 2. Tiny C5-6 central disc protrusion without stenosis. 3. Negative thoracic spine MRI.  Patient will benefit from orthopedic and neurology follow up. Still unclear as to the etiology of her ongoing paresthesia.   Final Clinical Impressions(s) / ED Diagnoses   Final diagnoses:  Paresthesia    ED Discharge Orders         Ordered    ibuprofen (ADVIL,MOTRIN) 400 MG tablet  3 times daily     09/08/18 2229           Jean Rosenthal, MD 09/08/18 2229    Carmin Muskrat, MD 09/08/18 2307

## 2018-09-08 NOTE — ED Notes (Signed)
Pt given juice and a sandwich.

## 2018-09-08 NOTE — ED Notes (Signed)
Pt aware of need for urine sample for UA. Urine specimen cup provided

## 2018-09-08 NOTE — ED Notes (Signed)
Patient remains in MRI 

## 2018-09-08 NOTE — ED Notes (Signed)
Pt aware of need for urine sample for UA, urine cup provided

## 2018-09-09 ENCOUNTER — Telehealth (INDEPENDENT_AMBULATORY_CARE_PROVIDER_SITE_OTHER): Payer: Self-pay | Admitting: Radiology

## 2018-09-09 ENCOUNTER — Other Ambulatory Visit (INDEPENDENT_AMBULATORY_CARE_PROVIDER_SITE_OTHER): Payer: Self-pay | Admitting: Family Medicine

## 2018-09-09 ENCOUNTER — Telehealth (INDEPENDENT_AMBULATORY_CARE_PROVIDER_SITE_OTHER): Payer: Self-pay

## 2018-09-09 DIAGNOSIS — M79604 Pain in right leg: Secondary | ICD-10-CM

## 2018-09-09 DIAGNOSIS — M79603 Pain in arm, unspecified: Secondary | ICD-10-CM

## 2018-09-09 DIAGNOSIS — M542 Cervicalgia: Secondary | ICD-10-CM

## 2018-09-09 NOTE — Telephone Encounter (Signed)
See message below °

## 2018-09-09 NOTE — Progress Notes (Signed)
MRI scans did not show any significant abnormality.  We will start physical therapy.

## 2018-09-09 NOTE — Telephone Encounter (Signed)
done

## 2018-09-09 NOTE — Telephone Encounter (Signed)
I was working on the authorization for the MRI Cspine wo that was ordered, patient just had an MRI Cspine w/wo done yesterday which was ordered by another physician. Insurance will deny or question why another will need to be done, is it ok to cancel your order?

## 2018-09-09 NOTE — Telephone Encounter (Signed)
I have spoken with her and Dr. Junius Roads also spoke with her.  She knows about the PT referral already.  Dr. Phoebe Sharps response was not available at the time.

## 2018-09-09 NOTE — Telephone Encounter (Signed)
MRI scans were basically normal.  No indication for surgery.  I have made referral to physical therapy.

## 2018-09-09 NOTE — Telephone Encounter (Signed)
CALLED PATIENT TO ADVISE. SCHEDULED APPT FOR Tuesday.

## 2018-09-09 NOTE — Telephone Encounter (Signed)
Patient called triage line. Stated she has been here twice. She initially was seen by Dr Erlinda Hong, was scheduled for CTR that is set for 10/25.  Her pain continued and was getting worse so she was here last week and saw Dr Junius Roads. She states that yesterday pain became so intense and so severe that she could not even walk. She stated that all her joints were very painful and had difficulty moving.  She went to the ER and they did MRI of her brain, csp and tsp. They gave her ibuprofen and told her that she needed to follow up with neurology and orthopedics. 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 She is on day #4 of the prednisone that was prescribed by Dr Junius Roads without any relief. Please call patient to further advise. [phone] (wasn't sure who would be best to address so sending to both of you )

## 2018-09-09 NOTE — Telephone Encounter (Signed)
Patient called again to state she has not heard back from anyone regarding her call to triage today, she is in pain and wanted to know what the next steps for her are. Please call patient back # [phone]

## 2018-09-09 NOTE — Progress Notes (Signed)
Patient called stating she is in severe pain.  I reviewed her MRI scans which were unrevealing.  Nothing to explain her symptoms.  I recommended trying physical therapy and placed a referral for that.

## 2018-09-09 NOTE — Telephone Encounter (Signed)
As for the right carpal tunnel syndrome, we can proceed with surgery whenever she wants.  But I have no answer as to why all of her joints hurt so bad that she couldn't walk.  It sounds like she may have an inflammatory arthritis or autoimmune disorder.  I'm happy to see her to discuss and likely referral to rheumatology.

## 2018-09-09 NOTE — Telephone Encounter (Signed)
Please advise 

## 2018-09-09 NOTE — Telephone Encounter (Signed)
Have you called patient yet? Dr Erlinda Hong responded now.

## 2018-09-09 NOTE — Telephone Encounter (Signed)
Yes, ok to cancel

## 2018-09-09 NOTE — Telephone Encounter (Signed)
Pending Dr Phoebe Sharps response.

## 2018-09-12 ENCOUNTER — Other Ambulatory Visit: Payer: Self-pay

## 2018-09-12 ENCOUNTER — Encounter: Payer: Self-pay | Admitting: Neurology

## 2018-09-12 ENCOUNTER — Ambulatory Visit (INDEPENDENT_AMBULATORY_CARE_PROVIDER_SITE_OTHER): Payer: Medicaid Other | Admitting: Neurology

## 2018-09-12 ENCOUNTER — Encounter: Payer: Self-pay | Admitting: Physical Therapy

## 2018-09-12 ENCOUNTER — Ambulatory Visit: Payer: Medicaid Other | Attending: Family Medicine | Admitting: Physical Therapy

## 2018-09-12 ENCOUNTER — Other Ambulatory Visit: Payer: Medicaid Other

## 2018-09-12 VITALS — BP 100/70 | HR 65 | Ht 61.0 in | Wt 135.1 lb

## 2018-09-12 DIAGNOSIS — M62838 Other muscle spasm: Secondary | ICD-10-CM | POA: Insufficient documentation

## 2018-09-12 DIAGNOSIS — M25511 Pain in right shoulder: Secondary | ICD-10-CM | POA: Diagnosis not present

## 2018-09-12 DIAGNOSIS — M2550 Pain in unspecified joint: Secondary | ICD-10-CM | POA: Diagnosis not present

## 2018-09-12 NOTE — Progress Notes (Signed)
[facility] Clinic Note - Initial Visit   Date: 52/84/13  Evelyn [name] MRN: [id number] DOB: 07-15-1986   Dear Dr. Vanita Panda:  Thank you for your kind referral of Evelyn [name] for consultation of paresthesias. Although her history is well known to you, please allow [place] to reiterate it for the purpose of our medical record. The patient was accompanied to the clinic by 4 children.    History of Present Illness: Evelyn [name] is a 32 y.o. right-handed Caucasian female presenting for evaluation of generalized right sided burning pain.    Starting in early October 2019, she began having burning pain involving the right shoulder, neck, and fingers.  She also has numbness and tingling of the right hand.  She attributed her symptoms to her known history of right CTS and is scheduled to have CTS release on 10/25. She followed-up with her orthopeadic specialist who ordered cervical imaging due to symptoms extending beyond the median distribution.  Imaging was normal.  Over the next few days, she began having similar burning achy pain over the right hip and left side of the body.  She then developed severe pain-limiting weakness of the right hip, especially with walking which prompted her to go to the ER due to concern of stroke as symptoms were predominately right-sided.  She has tried tylenol, ibuprofen, and prednisone without any relief.  She had extensive imaging of the CNS including MRI brain, cervical spine, and thoracic spine which was essentially normal.  She continues to have burning pain which upon asking is localized to the joints, moreso than muscles.  She denies any recent infection, stress triggers, or medication changes.  She is being referred to see rheumatology for these symptoms as well.     Out-side paper records, electronic medical record, and images have been reviewed where available and summarized as:  MRI brain, cervical spine, and  thoracic spine wo contrast 09/08/2018: 1. Negative brain MRI. 2. Tiny C5-6 central disc protrusion without stenosis. 3. Negative thoracic spine MRI.  Past Medical History:  Diagnosis Date  . Allergy   . Depression    post partum  . GERD (gastroesophageal reflux disease)   . HELLP syndrome    2015  . HSV (herpes simplex virus) infection   . Vaginal delivery 2004, 2009, 2011    Past Surgical History:  Procedure Laterality Date  . CESAREAN SECTION N/A 08/05/2014   Procedure: CESAREAN SECTION;  Surgeon: Jerelyn Charles, MD;  Location: [facility];  Service: Obstetrics;  Laterality: N/A;  . HERNIA REPAIR  2011  . LAPAROSCOPIC BILATERAL SALPINGECTOMY N/A 12/13/2015   Procedure: LAPAROSCOPIC BILATERAL SALPINGECTOMY;  Surgeon: Emily Filbert, MD;  Location: [facility];  Service: Gynecology;  Laterality: N/A;  . PERINEOPLASTY N/A 02/20/2016   Procedure: PERINEOPLASTY;  Surgeon: Emily Filbert, MD;  Location: [facility];  Service: Gynecology;  Laterality: N/A;  . PUBOVAGINAL SLING N/A 12/13/2015   Procedure: Gaynelle Arabian;  Surgeon: Emily Filbert, MD;  Location: [facility];  Service: Gynecology;  Laterality: N/A;  . RECTOCELE REPAIR N/A 02/20/2016   Procedure: POSTERIOR REPAIR (RECTOCELE);  Surgeon: Emily Filbert, MD;  Location: [facility];  Service: Gynecology;  Laterality: N/A;     Medications:  Outpatient Encounter Medications as of 09/12/2018  Medication Sig Note  . Cholecalciferol (VITAMIN D-3) 5000 units TABS Take 1 tablet by mouth daily.   Marland Kitchen levonorgestrel (MIRENA) 20 MCG/24HR IUD 1 each by Intrauterine route once.   Marland Kitchen tiZANidine (ZANAFLEX) 2 MG  tablet Take 1-2 tablets (2-4 mg total) by mouth every 6 (six) hours as needed for muscle spasms.   . valACYclovir (VALTREX) 1000 MG tablet Take 500 mg by mouth daily as needed (outbreak).    Marland Kitchen ibuprofen (ADVIL,MOTRIN) 400 MG tablet Take 1 tablet (400 mg total) by mouth 3 (three) times daily for 4 days. (Patient not taking: Reported on 09/12/2018)   . [DISCONTINUED]  methylPREDNISolone (MEDROL DOSEPAK) 4 MG TBPK tablet As directed for 6 days. (Patient taking differently: Take 4-24 mg by mouth See admin instructions. Take 6,5,4,3,2,1) 09/08/2018: Still has 4 full days left (4,3,2,1, then stop)   No facility-administered encounter medications on file as of 09/12/2018.      Allergies: No Known Allergies  Family History: Family History  Problem Relation Age of Onset  . Asthma Son   . Heart disease Neg Hx   . Neurologic Disorder Neg Hx     Social History: Social History   Tobacco Use  . Smoking status: Never Smoker  . Smokeless tobacco: Never Used  Substance Use Topics  . Alcohol use: Not Currently    Comment: occ  . Drug use: No   Social History   Social History Narrative   Married. Pt does not exercise.   She works part-time at [employer] level of education: 12th grade    Review of Systems:  CONSTITUTIONAL: No fevers, chills, night sweats, or weight loss.   EYES: No visual changes or eye pain ENT: No hearing changes.  No history of nose bleeds.   RESPIRATORY: No cough, wheezing and shortness of breath.   CARDIOVASCULAR: Negative for chest pain, and palpitations.   GI: Negative for abdominal discomfort, blood in stools or black stools.  No recent change in bowel habits.   GU:  No history of incontinence.   MUSCLOSKELETAL: +history of joint pain or swelling.  No myalgias.   SKIN: Negative for lesions, rash, and itching.   HEMATOLOGY/ONCOLOGY: Negative for prolonged bleeding, bruising easily, and swollen nodes.  No history of cancer.   ENDOCRINE: Negative for cold or heat intolerance, polydipsia or goiter.   PSYCH:  No depression or anxiety symptoms.   NEURO: As Above.   Vital Signs:  BP 100/70   Pulse 65   Ht 5\' 1"  (1.549 m)   Wt 135 lb 2 oz (61.3 kg)   SpO2 100%   BMI 25.53 kg/m    General Medical Exam:   General:  Well appearing, comfortable.   Eyes/ENT: see cranial nerve examination.   Neck: No masses  appreciated.  Full range of motion without tenderness.  No carotid bruits. Respiratory:  Clear to auscultation, good air entry bilaterally.   Cardiac:  Regular rate and rhythm, no murmur.   Extremities:  No deformities, edema, or skin discoloration.  There is no pain to palpation over the muscles or joints.  Skin:  No rashes or lesions.  Neurological Exam: MENTAL STATUS including orientation to time, place, person, recent and remote memory, attention span and concentration, language, and fund of knowledge is normal.  Speech is not dysarthric.  CRANIAL NERVES: II:  No visual field defects.  Unremarkable fundi.   III-IV-VI: Pupils equal round and reactive to light.  Normal conjugate, extra-ocular eye movements in all directions of gaze.  No nystagmus.  No ptosis.   V:  Normal facial sensation.     VII:  Normal facial symmetry and movements.  No pathologic facial reflexes.  VIII:  Normal hearing and vestibular function.  IX-X:  Normal palatal movement.   XI:  Normal shoulder shrug and head rotation.   XII:  Normal tongue strength and range of motion, no deviation or fasciculation.  MOTOR:  No atrophy, fasciculations or abnormal movements.  No pronator drift.  Tone is normal.    Right Upper Extremity:    Left Upper Extremity:    Deltoid  5/5   Deltoid  5/5   Biceps  5/5   Biceps  5/5   Triceps  5/5   Triceps  5/5   Wrist extensors  5/5   Wrist extensors  5/5   Wrist flexors  5/5   Wrist flexors  5/5   Finger extensors  5/5   Finger extensors  5/5   Finger flexors  5/5   Finger flexors  5/5   Dorsal interossei  5/5   Dorsal interossei  5/5   Abductor pollicis  5/5   Abductor pollicis  5/5   Tone (Ashworth scale)  0  Tone (Ashworth scale)  0   Right Lower Extremity:    Left Lower Extremity:    Hip flexors  5/5   Hip flexors  5/5   Hip extensors  5/5   Hip extensors  5/5   Knee flexors  5/5   Knee flexors  5/5   Knee extensors  5/5   Knee extensors  5/5   Dorsiflexors  5/5    Dorsiflexors  5/5   Plantarflexors  5/5   Plantarflexors  5/5   Toe extensors  5/5   Toe extensors  5/5   Toe flexors  5/5   Toe flexors  5/5   Tone (Ashworth scale)  0  Tone (Ashworth scale)  0   MSRs:  Right                                                                 Left brachioradialis 2+  brachioradialis 2+  biceps 2+  biceps 2+  triceps 2+  triceps 2+  patellar 2+  patellar 2+  ankle jerk 2+  ankle jerk 2+  Hoffman no  Hoffman no  plantar response down  plantar response down   SENSORY:  Normal and symmetric perception of light touch, pinprick, vibration, and proprioception.   COORDINATION/GAIT: Normal finger-to- nose-finger and heel-to-shin.  Intact rapid alternating movements bilaterally.  Able to rise from a chair without using arms.  Gait narrow based and stable. Tandem and stressed gait intact.    IMPRESSION: Evelyn [name] is a 32 year-old female referred for evaluation of generalized polyarthralgias.  Her neurological exam is entirely normal and non-focal.  I have personally viewed her MRI brain, cervical spine, and thoracic spine which is also normal.  Her symptoms are too widespread for a peripheral nerve pathology and with normal exam, I reassured her that the likelihood of primary neurological condition is very low.  I do not see that electrodiagnostic testing will be of high value in this patient.  She will be seeing rheumatology for ongoing polyarthralgias.  I also encouraged her to establish care with primary care provider.    Thank you for allowing me to participate in patient's care.  If I can answer any additional questions, I would be pleased to do so.    Sincerely,  Posie Lillibridge K. Posey Pronto, DO

## 2018-09-12 NOTE — Therapy (Signed)
[place], [place], [address] Phone: [phone]   Fax:  [phone]  Physical Therapy Evaluation  Patient Details  Name: Evelyn [name] MRN: [id number] Date of Birth: 08-13-1986 Referring Provider (PT): Dr Eunice Blase    Encounter Date: 09/12/2018    Past Medical History:  Diagnosis Date  . Allergy   . Depression    post partum  . GERD (gastroesophageal reflux disease)   . HELLP syndrome    2015  . HSV (herpes simplex virus) infection   . Vaginal delivery 2004, 2009, 2011    Past Surgical History:  Procedure Laterality Date  . CESAREAN SECTION N/A 08/05/2014   Procedure: CESAREAN SECTION;  Surgeon: Jerelyn Charles, MD;  Location: [facility];  Service: Obstetrics;  Laterality: N/A;  . HERNIA REPAIR  2011  . LAPAROSCOPIC BILATERAL SALPINGECTOMY N/A 12/13/2015   Procedure: LAPAROSCOPIC BILATERAL SALPINGECTOMY;  Surgeon: Emily Filbert, MD;  Location: [facility];  Service: Gynecology;  Laterality: N/A;  . PERINEOPLASTY N/A 02/20/2016   Procedure: PERINEOPLASTY;  Surgeon: Emily Filbert, MD;  Location: [facility];  Service: Gynecology;  Laterality: N/A;  . PUBOVAGINAL SLING N/A 12/13/2015   Procedure: Gaynelle Arabian;  Surgeon: Emily Filbert, MD;  Location: [facility];  Service: Gynecology;  Laterality: N/A;  . RECTOCELE REPAIR N/A 02/20/2016   Procedure: POSTERIOR REPAIR (RECTOCELE);  Surgeon: Emily Filbert, MD;  Location: [facility];  Service: Gynecology;  Laterality: N/A;    There were no vitals filed for this visit.   Subjective Assessment - 09/12/18 0814    Subjective  Patient began having neck and shoulder pain for about 2 weeks ago. She felt it was related to her carpel tunnel. He pain then started spreading around her body. She had numbness in her right leg and pain in her knees. She feels like the pain moves around., She consitently has pain with movement of her right shoulder.     Currently in Pain?  Yes    Pain  Score  10-Worst pain ever    Pain Location  Shoulder    Pain Orientation  Right    Pain Descriptors / Indicators  Burning    Pain Type  Acute pain    Pain Onset  1 to 4 weeks ago    Pain Frequency  Constant    Aggravating Factors   light touch; flexiing her shoulder     Pain Relieving Factors  nothing     Effect of Pain on Daily Activities  difficulty using her right arm          [facility] PT Assessment - 09/12/18 0001      Assessment   Medical Diagnosis  Right Shoulder Pain/ Whole body pain     Referring Provider (PT)  Dr Legrand Como Hilts     Onset Date/Surgical Date  --   2 weeks prior    Hand Dominance  Right    Next MD Visit  Nothing scheudled     Prior Therapy  None       Precautions   Precautions  None      Restrictions   Weight Bearing Restrictions  No      Balance Screen   Has the patient fallen in the past 6 months  No    Has the patient had a decrease in activity level because of a fear of falling?   No    Is the patient reluctant to leave their home because of a fear  of falling?   No      Home Environment   Additional Comments  Lives at home with 4 children       Prior Function   Level of Independence  Independent    Vocation  Full time employment    Vocation Requirements  works in Biomedical scientist   Overall Cognitive Status  Within Functional Limits for tasks assessed    Attention  Focused    Focused Attention  Appears intact    Memory  Appears intact    Awareness  Appears intact    Problem Solving  Appears intact      Sensation   Light Touch  Impaired Detail    Light Touch Impaired Details  Impaired RUE    Additional Comments  Patient reports just the touch of her bra strap causes her pain in her shoulder. She feels like th epain can spread form her neck down into her leg. She also feels pressure in her eye.       Coordination   Gross Motor Movements are Fluid and Coordinated  Yes    Fine Motor Movements are Fluid and Coordinated  Yes       ROM / Strength   AROM / PROM / Strength  AROM;PROM;Strength      AROM   AROM Assessment Site  Shoulder    Right/Left Shoulder  Right    Right Shoulder Flexion  155 Degrees    Right Shoulder Internal Rotation  --   T-10    Right Shoulder External Rotation  --   pain when reaching behind her head      PROM   Overall PROM Comments  pinching with abduction at 110 degrees in the ac joint. All other motions WNL       Strength   Strength Assessment Site  Shoulder;Hand    Right/Left Shoulder  Right;Left    Right Shoulder Flexion  5/5    Right Shoulder Internal Rotation  5/5    Right Shoulder External Rotation  5/5    Left Shoulder Flexion  5/5    Left Shoulder Internal Rotation  5/5    Left Shoulder External Rotation  5/5    Right/Left hand  Right;Left    Right Hand Grip (lbs)  20    Left Hand Grip (lbs)  60      Palpation   Palpation comment  increased tightness on the r upper trap compared to l upper trap but overall tightness in bil upper traps      Special Tests   Other special tests  SPULRINGS (-) RIGHT  (+) left                 Objective measurements completed on examination: See above findings.      [facility] Adult PT Treatment/Exercise - 09/12/18 0001      Exercises   Exercises  Neck      Neck Exercises: Standing   Other Standing Exercises  tennis ball trigger point release reviewed for home       Neck Exercises: Supine   Other Supine Exercise  wand flexion x10       Neck Exercises: Stretches   Upper Trapezius Stretch  2 reps;20 seconds    Levator Stretch  2 reps;20 seconds             PT Education - 09/12/18 0920    Education Details  symptom managment, HEP;     Person(s) Educated  Patient    Methods  Explanation;Demonstration;Tactile cues;Verbal cues    Comprehension  Verbalized understanding;Returned demonstration;Verbal cues required;Tactile cues required       PT Short Term Goals - 09/12/18 0921      PT SHORT TERM GOAL #1   Title   Patient will report centralized right shoulder pain <3/10     Baseline  pain radiating down her entire arm that is reaching a 10/10     Time  3    Period  Weeks    Status  New    Target Date  10/03/18      PT SHORT TERM GOAL #2   Title  Patient will increase right grip strength by 20 lbs     Baseline  R 20 lbs left 60 lbs     Time  3    Period  Weeks    Status  New    Target Date  10/03/18      PT SHORT TERM GOAL #3   Title  Patient will be independnet with basic HEP for muscle tightness and active shoulder ROM     Baseline  no HEP     Time  3    Period  Weeks    Status  New    Target Date  10/03/18                Plan - 09/12/18 0911    Clinical Impression Statement  Patient is a 32 year old fmeale with right shoulder and upper trap pain. She also at times is having numbness in her right leg, pain in both knees, numbness in her left arm, and numbness and cramping in her hands. She does have a history of carpal tunnel syndrome. She will be having carpel tunnel release on October 25th on the right. She has spasming of her upper trap and pain with active movement of her right shoulder. She has full shoulder strength but limited grip strength on the right. Therapy will treat her current symptoms but there is some concern with the severity of her symptoms, and the fact that they change from limb to limb so qucikly that a large part of her pain may be more systemic. Therapy will treat local symptoms at this time. Patient was given light stretches today but advised to do them only if they improved her symptoms. At this time the pateint reports even the weight of her bra strpa casues significant burning in her right upper extremity     History and Personal Factors relevant to plan of care:  depression, chronic lower back paoin     Clinical Presentation  Evolving    Clinical Presentation due to:  increasing numbness throughout her whole body    Clinical Decision Making  Moderate     Clinical Impairments Affecting Rehab Potential  depression, chronic low banck pain     PT Frequency  1x / week    PT Duration  3 weeks    PT Treatment/Interventions  ADLs/Self Care Home Management;Cryotherapy;Electrical Stimulation;Iontophoresis 4mg /ml Dexamethasone;Neuromuscular re-education;Patient/family education;Therapeutic exercise;Therapeutic activities;Manual techniques;Passive range of motion;Taping;Dry needling;Traction;Ultrasound;Moist Heat    PT Next Visit Plan  assess tolerance to HEP; continue to work on reduceing aspasming of her upper trap and improving shoulder active pain free motion; continue with manual therapy of the neck and the shoulder, consdier light shoulder exercises including sidelying ER; supine flexion; pulleys     PT Home Exercise Plan  upper trap stretch; levator stretch;     Consulted and  Agree with Plan of Care  Patient       Patient will benefit from skilled therapeutic intervention in order to improve the following deficits and impairments:  Pain, Decreased activity tolerance, Decreased range of motion, Decreased strength, Increased muscle spasms  Visit Diagnosis: Acute pain of right shoulder  Other muscle spasm     Problem List Patient Active Problem List   Diagnosis Date Noted  . Stress incontinence 12/13/2015  . Encounter for female sterilization procedure 12/13/2015  . Hypertension in pregnancy 08/03/2014  . Preeclampsia 08/03/2014  . Chronic back pain 01/14/2014  . Allergic rhinitis 01/14/2014    Carney Living PT DPT  09/12/2018, 9:25 AM   Einar Crow DPT   Student was present during treatment but PT performed all treatment and evaluation.   [place], [place], [address] Phone: [phone]   Fax:  [phone]  Name: Evelyn [name] MRN: [id number] Date of Birth: 19-Jun-1986

## 2018-09-16 ENCOUNTER — Encounter (INDEPENDENT_AMBULATORY_CARE_PROVIDER_SITE_OTHER): Payer: Self-pay | Admitting: Orthopaedic Surgery

## 2018-09-16 ENCOUNTER — Ambulatory Visit (INDEPENDENT_AMBULATORY_CARE_PROVIDER_SITE_OTHER): Payer: Medicaid Other | Admitting: Orthopaedic Surgery

## 2018-09-16 DIAGNOSIS — M792 Neuralgia and neuritis, unspecified: Secondary | ICD-10-CM | POA: Diagnosis not present

## 2018-09-16 NOTE — Progress Notes (Signed)
Office Visit Note   Patient: Evelyn [name]           Date of Birth: Jun 06, 1986           MRN: [id number] Visit Date: 09/16/2018              Requested by: No referring provider defined for this encounter. PCP: System, Provider Not In   Assessment & Plan: Visit Diagnoses:  1. Radicular pain in right arm     Plan: At this point, it is hard to explain the patient's current symptoms.  We will obtain a rheumatoid panel to rule out autoimmune disease.  We will be in touch with her over the phone.  We will go ahead and proceed with right carpal tunnel release scheduled for 09/26/2018.  Call with concerns or questions in the meantime.  Follow-Up Instructions: Return for 2 weeks post-op.   Orders:  Orders Placed This Encounter  Procedures  . Antinuclear Antib (ANA)  . Sed Rate (ESR)  . Rheumatoid Factor  . Uric acid   No orders of the defined types were placed in this encounter.     Procedures: No procedures performed   Clinical Data: No additional findings.   Subjective: Chief Complaint  Patient presents with  . Right Hand - Pain    HPI patient is a 32 year old female who presents to our clinic today with right upper extremity pain, tingling and burning.  This is been ongoing for several weeks now.  She has been seen by [place] recently where she was scheduled to have a right carpal tunnel release 2 weeks from now.  She has been experiencing increased pain and numbness to the entire right upper extremity.  This caused so much concerned that she took herself to the hospital.  She had an MRI of her cervical spine, thoracic spine as well as her brain which were all essentially negative.  She has been seen by a neurologist who did not find anything wrong.  She has no personal or family history of autoimmune disease.  She is now starting to experience pain to the left upper extremity as well as the left lower extremity.  Review of Systems as detailed in HPI.  All others  reviewed and are negative.   Objective: Vital Signs: There were no vitals taken for this visit.  Physical Exam well-developed well-nourished female no acute distress.  Alert and oriented x3.  Ortho Exam examination of her right upper extremity reveals full cervical spine motion.  Full range of motion of the right shoulder.  Full range of motion of her elbow as well as her wrist.  She does note soreness with all motions.  Specialty Comments:  No specialty comments available.  Imaging: No new imaging   PMFS History: Patient Active Problem List   Diagnosis Date Noted  . Radicular pain in right arm 09/16/2018  . Stress incontinence 12/13/2015  . Encounter for female sterilization procedure 12/13/2015  . Hypertension in pregnancy 08/03/2014  . Preeclampsia 08/03/2014  . Chronic back pain 01/14/2014  . Allergic rhinitis 01/14/2014   Past Medical History:  Diagnosis Date  . Allergy   . Depression    post partum  . GERD (gastroesophageal reflux disease)   . HELLP syndrome    2015  . HSV (herpes simplex virus) infection   . Vaginal delivery 2004, 2009, 2011    Family History  Problem Relation Age of Onset  . Asthma Son   . Heart disease Neg Hx   .  Neurologic Disorder Neg Hx     Past Surgical History:  Procedure Laterality Date  . CESAREAN SECTION N/A 08/05/2014   Procedure: CESAREAN SECTION;  Surgeon: Jerelyn Charles, MD;  Location: [facility];  Service: Obstetrics;  Laterality: N/A;  . HERNIA REPAIR  2011  . LAPAROSCOPIC BILATERAL SALPINGECTOMY N/A 12/13/2015   Procedure: LAPAROSCOPIC BILATERAL SALPINGECTOMY;  Surgeon: Emily Filbert, MD;  Location: [facility];  Service: Gynecology;  Laterality: N/A;  . PERINEOPLASTY N/A 02/20/2016   Procedure: PERINEOPLASTY;  Surgeon: Emily Filbert, MD;  Location: [facility];  Service: Gynecology;  Laterality: N/A;  . PUBOVAGINAL SLING N/A 12/13/2015   Procedure: Gaynelle Arabian;  Surgeon: Emily Filbert, MD;  Location: [facility];  Service: Gynecology;   Laterality: N/A;  . RECTOCELE REPAIR N/A 02/20/2016   Procedure: POSTERIOR REPAIR (RECTOCELE);  Surgeon: Emily Filbert, MD;  Location: [facility];  Service: Gynecology;  Laterality: N/A;   Social History   Occupational History  . Occupation: Not Employed  Tobacco Use  . Smoking status: Never Smoker  . Smokeless tobacco: Never Used  Substance and Sexual Activity  . Alcohol use: Not Currently    Comment: occ  . Drug use: No  . Sexual activity: Yes    Partners: Male    Birth control/protection: Surgical

## 2018-09-17 ENCOUNTER — Other Ambulatory Visit: Payer: Self-pay

## 2018-09-17 ENCOUNTER — Encounter (HOSPITAL_BASED_OUTPATIENT_CLINIC_OR_DEPARTMENT_OTHER): Payer: Self-pay | Admitting: *Deleted

## 2018-09-18 ENCOUNTER — Telehealth (INDEPENDENT_AMBULATORY_CARE_PROVIDER_SITE_OTHER): Payer: Self-pay | Admitting: Orthopaedic Surgery

## 2018-09-18 ENCOUNTER — Telehealth (INDEPENDENT_AMBULATORY_CARE_PROVIDER_SITE_OTHER): Payer: Self-pay

## 2018-09-18 ENCOUNTER — Other Ambulatory Visit (INDEPENDENT_AMBULATORY_CARE_PROVIDER_SITE_OTHER): Payer: Self-pay

## 2018-09-18 DIAGNOSIS — R7689 Other specified abnormal immunological findings in serum: Secondary | ICD-10-CM

## 2018-09-18 DIAGNOSIS — R768 Other specified abnormal immunological findings in serum: Secondary | ICD-10-CM

## 2018-09-18 LAB — SEDIMENTATION RATE: SED RATE: 25 mm/h — AB (ref 0–20)

## 2018-09-18 LAB — ANTI-NUCLEAR AB-TITER (ANA TITER)

## 2018-09-18 LAB — ANA: Anti Nuclear Antibody(ANA): POSITIVE — AB

## 2018-09-18 LAB — URIC ACID: Uric Acid, Serum: 5.8 mg/dL (ref 2.5–7.0)

## 2018-09-18 LAB — RHEUMATOID FACTOR: Rhuematoid fact SerPl-aCnc: <14 IU/mL (ref <14)

## 2018-09-18 NOTE — Telephone Encounter (Signed)
Patient is very frustrated, after I told her she would need to call her PCP to schedule an appt she said she does not have one and states she "cant keep getting sent from doctor to doctor" that she needs to find out whats going on. She then asked if she should go to urgent care. I told her I was unsure and that I would send a message to see if you could call her and explain. Patients # [phone]

## 2018-09-18 NOTE — Progress Notes (Signed)
Please let her know that her ANA is positive which means that she may have an autoimmune disorder.  We will refer her to rheumatology.  Thanks.

## 2018-09-18 NOTE — Telephone Encounter (Signed)
Called patient to let her know. referral made

## 2018-09-18 NOTE — Telephone Encounter (Signed)
See message below °

## 2018-09-18 NOTE — Telephone Encounter (Signed)
Called patient no answer. Could not leave VM. Phone just kept ringing and ringing. Will try again later.   Per Dr Erlinda Hong "Please let her know that her ANA is positive which means that she may have an autoimmune disorder. We will refer her to rheumatology. Thanks. "

## 2018-09-19 ENCOUNTER — Telehealth (INDEPENDENT_AMBULATORY_CARE_PROVIDER_SITE_OTHER): Payer: Self-pay

## 2018-09-19 NOTE — Telephone Encounter (Signed)
Patient called to see where Rheumatology referral was sent.  Cb# is [phone].  Please advise.  Thank you.

## 2018-09-19 NOTE — Telephone Encounter (Signed)
Called patient back to advise. Depends where they will approve her. But someone will reach out to her to let her know when and where.Marland Kitchen

## 2018-09-22 ENCOUNTER — Ambulatory Visit: Payer: Medicaid Other | Admitting: Physical Therapy

## 2018-09-26 ENCOUNTER — Encounter (HOSPITAL_BASED_OUTPATIENT_CLINIC_OR_DEPARTMENT_OTHER)
Admission: RE | Disposition: A | Discharge status: Home / Self Care | Payer: Self-pay | Source: Ambulatory Visit | Attending: Orthopaedic Surgery

## 2018-09-26 ENCOUNTER — Other Ambulatory Visit: Payer: Self-pay

## 2018-09-26 ENCOUNTER — Ambulatory Visit (HOSPITAL_BASED_OUTPATIENT_CLINIC_OR_DEPARTMENT_OTHER)
Admission: RE | Admit: 2018-09-26 | Discharge: 2018-09-26 | Disposition: A | Discharge status: Home / Self Care | Payer: Medicaid Other | Source: Ambulatory Visit | Attending: Orthopaedic Surgery | Admitting: Orthopaedic Surgery

## 2018-09-26 ENCOUNTER — Ambulatory Visit (HOSPITAL_BASED_OUTPATIENT_CLINIC_OR_DEPARTMENT_OTHER): Payer: Medicaid Other | Admitting: Anesthesiology

## 2018-09-26 ENCOUNTER — Encounter (HOSPITAL_BASED_OUTPATIENT_CLINIC_OR_DEPARTMENT_OTHER): Payer: Self-pay | Admitting: Anesthesiology

## 2018-09-26 DIAGNOSIS — F329 Major depressive disorder, single episode, unspecified: Secondary | ICD-10-CM | POA: Diagnosis not present

## 2018-09-26 DIAGNOSIS — G5601 Carpal tunnel syndrome, right upper limb: Secondary | ICD-10-CM | POA: Diagnosis not present

## 2018-09-26 DIAGNOSIS — K219 Gastro-esophageal reflux disease without esophagitis: Secondary | ICD-10-CM | POA: Diagnosis not present

## 2018-09-26 HISTORY — PX: CARPAL TUNNEL RELEASE: SHX101

## 2018-09-26 LAB — POCT PREGNANCY, URINE: Preg Test, Ur: NEGATIVE

## 2018-09-26 SURGERY — CARPAL TUNNEL RELEASE
Anesthesia: General | Site: Wrist | Laterality: Right

## 2018-09-26 MED ORDER — MEPERIDINE HCL 25 MG/ML IJ SOLN: 6.2500 mg | INTRAMUSCULAR | Status: DC | PRN

## 2018-09-26 MED ORDER — CEFAZOLIN SODIUM-DEXTROSE 2-4 GM/100ML-% IV SOLN
2.0000 g | INTRAVENOUS | Status: AC
  Administered 2018-09-26: 2 g via INTRAVENOUS

## 2018-09-26 MED ORDER — PROPOFOL 500 MG/50ML IV EMUL
INTRAVENOUS | Status: DC | PRN
  Administered 2018-09-26: 75 ug/kg/min via INTRAVENOUS

## 2018-09-26 MED ORDER — ONDANSETRON HCL 4 MG/2ML IJ SOLN
INTRAMUSCULAR | Status: DC | PRN
  Administered 2018-09-26: 4 mg via INTRAVENOUS

## 2018-09-26 MED ORDER — MIDAZOLAM HCL 2 MG/2ML IJ SOLN
1.0000 mg | INTRAMUSCULAR | Status: DC | PRN
  Administered 2018-09-26: 1 mg via INTRAVENOUS

## 2018-09-26 MED ORDER — LIDOCAINE HCL (PF) 0.5 % IJ SOLN
INTRAMUSCULAR | Status: DC | PRN
  Administered 2018-09-26: 40 mL via INTRAVENOUS

## 2018-09-26 MED ORDER — ONDANSETRON HCL 4 MG/2ML IJ SOLN
INTRAMUSCULAR | Status: AC
  Filled 2018-09-26: qty 2

## 2018-09-26 MED ORDER — ONDANSETRON HCL 4 MG PO TABS: 4.0000 mg | ORAL_TABLET | Freq: Three times a day (TID) | ORAL | 0 refills | Status: DC | PRN

## 2018-09-26 MED ORDER — HYDROCODONE-ACETAMINOPHEN 5-325 MG PO TABS: 1.0000 | ORAL_TABLET | Freq: Two times a day (BID) | ORAL | 0 refills | Status: DC | PRN

## 2018-09-26 MED ORDER — OXYCODONE HCL 5 MG PO TABS: 5.0000 mg | ORAL_TABLET | Freq: Once | ORAL | Status: DC | PRN

## 2018-09-26 MED ORDER — LIDOCAINE 2% (20 MG/ML) 5 ML SYRINGE
INTRAMUSCULAR | Status: AC
  Filled 2018-09-26: qty 5

## 2018-09-26 MED ORDER — ACETAMINOPHEN 325 MG PO TABS: 325.0000 mg | ORAL_TABLET | ORAL | Status: DC | PRN

## 2018-09-26 MED ORDER — ONDANSETRON HCL 4 MG/2ML IJ SOLN: 4.0000 mg | Freq: Once | INTRAMUSCULAR | Status: DC | PRN

## 2018-09-26 MED ORDER — FENTANYL CITRATE (PF) 100 MCG/2ML IJ SOLN: 25.0000 ug | INTRAMUSCULAR | Status: DC | PRN

## 2018-09-26 MED ORDER — CHLORHEXIDINE GLUCONATE 4 % EX LIQD: 60.0000 mL | Freq: Once | CUTANEOUS | Status: DC

## 2018-09-26 MED ORDER — OXYCODONE HCL 5 MG/5ML PO SOLN: 5.0000 mg | Freq: Once | ORAL | Status: DC | PRN

## 2018-09-26 MED ORDER — CEFAZOLIN SODIUM-DEXTROSE 2-4 GM/100ML-% IV SOLN
INTRAVENOUS | Status: AC
  Filled 2018-09-26: qty 100

## 2018-09-26 MED ORDER — SCOPOLAMINE 1 MG/3DAYS TD PT72: 1.0000 | MEDICATED_PATCH | Freq: Once | TRANSDERMAL | Status: DC | PRN

## 2018-09-26 MED ORDER — LIDOCAINE HCL (CARDIAC) PF 100 MG/5ML IV SOSY
PREFILLED_SYRINGE | INTRAVENOUS | Status: DC | PRN
  Administered 2018-09-26: 50 mg via INTRAVENOUS

## 2018-09-26 MED ORDER — MIDAZOLAM HCL 2 MG/2ML IJ SOLN
INTRAMUSCULAR | Status: AC
  Filled 2018-09-26: qty 2

## 2018-09-26 MED ORDER — LACTATED RINGERS IV SOLN
INTRAVENOUS | Status: DC
  Administered 2018-09-26: 11:00:00 via INTRAVENOUS

## 2018-09-26 MED ORDER — LACTATED RINGERS IV SOLN: INTRAVENOUS | Status: DC

## 2018-09-26 MED ORDER — DEXAMETHASONE SODIUM PHOSPHATE 10 MG/ML IJ SOLN
INTRAMUSCULAR | Status: AC
  Filled 2018-09-26: qty 1

## 2018-09-26 MED ORDER — BUPIVACAINE HCL (PF) 0.25 % IJ SOLN
INTRAMUSCULAR | Status: DC | PRN
  Administered 2018-09-26: 10 mL

## 2018-09-26 MED ORDER — FENTANYL CITRATE (PF) 100 MCG/2ML IJ SOLN
INTRAMUSCULAR | Status: AC
  Filled 2018-09-26: qty 2

## 2018-09-26 MED ORDER — FENTANYL CITRATE (PF) 100 MCG/2ML IJ SOLN
50.0000 ug | INTRAMUSCULAR | Status: DC | PRN
  Administered 2018-09-26: 50 ug via INTRAVENOUS

## 2018-09-26 MED ORDER — ACETAMINOPHEN 160 MG/5ML PO SOLN: 325.0000 mg | ORAL | Status: DC | PRN

## 2018-09-26 SURGICAL SUPPLY — 49 items
BANDAGE ACE 3X5.8 VEL STRL LF (GAUZE/BANDAGES/DRESSINGS) ×3 IMPLANT
BLADE MINI RND TIP GREEN BEAV (BLADE) ×3 IMPLANT
BLADE SURG 15 STRL LF DISP TIS (BLADE) ×1 IMPLANT
BLADE SURG 15 STRL SS (BLADE) ×2
BNDG CMPR 9X4 STRL LF SNTH (GAUZE/BANDAGES/DRESSINGS) ×1
BNDG ESMARK 4X9 LF (GAUZE/BANDAGES/DRESSINGS) ×3 IMPLANT
BNDG PLASTER X FAST 3X3 WHT LF (CAST SUPPLIES) IMPLANT
BNDG PLSTR 9X3 FST ST WHT (CAST SUPPLIES)
BRUSH SCRUB EZ PLAIN DRY (MISCELLANEOUS) ×3 IMPLANT
CANISTER SUCT 1200ML W/VALVE (MISCELLANEOUS) ×3 IMPLANT
CORD BIPOLAR FORCEPS 12FT (ELECTRODE) ×3 IMPLANT
COVER BACK TABLE 60X90IN (DRAPES) ×3 IMPLANT
COVER MAYO STAND STRL (DRAPES) ×3 IMPLANT
COVER WAND RF STERILE (DRAPES) IMPLANT
CUFF TOURNIQUET SINGLE 18IN (TOURNIQUET CUFF) IMPLANT
DECANTER SPIKE VIAL GLASS SM (MISCELLANEOUS) IMPLANT
DRAPE EXTREMITY T 121X128X90 (DRAPE) ×3 IMPLANT
DRAPE IMP U-DRAPE 54X76 (DRAPES) ×3 IMPLANT
DRAPE SURG 17X23 STRL (DRAPES) ×3 IMPLANT
GAUZE 4X4 16PLY RFD (DISPOSABLE) IMPLANT
GAUZE SPONGE 4X4 12PLY STRL (GAUZE/BANDAGES/DRESSINGS) ×3 IMPLANT
GAUZE XEROFORM 1X8 LF (GAUZE/BANDAGES/DRESSINGS) ×3 IMPLANT
GLOVE BIOGEL PI IND STRL 7.0 (GLOVE) ×1 IMPLANT
GLOVE BIOGEL PI IND STRL 8 (GLOVE) ×1 IMPLANT
GLOVE BIOGEL PI INDICATOR 7.0 (GLOVE) ×2
GLOVE BIOGEL PI INDICATOR 8 (GLOVE) ×2
GLOVE ECLIPSE 7.0 STRL STRAW (GLOVE) ×3 IMPLANT
GLOVE SKINSENSE NS SZ7.5 (GLOVE) ×4
GLOVE SKINSENSE STRL SZ7.5 (GLOVE) ×2 IMPLANT
GLOVE SURG SYN 7.5  E (GLOVE) ×2
GLOVE SURG SYN 7.5 E (GLOVE) ×1 IMPLANT
GOWN STRL REIN XL XLG (GOWN DISPOSABLE) ×3 IMPLANT
GOWN STRL REUS W/ TWL XL LVL3 (GOWN DISPOSABLE) ×2 IMPLANT
GOWN STRL REUS W/TWL XL LVL3 (GOWN DISPOSABLE) ×4
NEEDLE HYPO 25X1 1.5 SAFETY (NEEDLE) ×3 IMPLANT
NS IRRIG 1000ML POUR BTL (IV SOLUTION) ×3 IMPLANT
PACK BASIN DAY SURGERY FS (CUSTOM PROCEDURE TRAY) ×3 IMPLANT
PAD CAST 3X4 CTTN HI CHSV (CAST SUPPLIES) ×1 IMPLANT
PADDING CAST COTTON 3X4 STRL (CAST SUPPLIES) ×2
RUBBERBAND STERILE (MISCELLANEOUS) ×6 IMPLANT
STOCKINETTE 4X48 STRL (DRAPES) ×3 IMPLANT
SUT ETHILON 3 0 PS 1 (SUTURE) ×3 IMPLANT
SYR BULB 3OZ (MISCELLANEOUS) ×3 IMPLANT
SYR CONTROL 10ML LL (SYRINGE) ×3 IMPLANT
TOWEL GREEN STERILE FF (TOWEL DISPOSABLE) ×3 IMPLANT
TRAY DSU PREP LF (CUSTOM PROCEDURE TRAY) ×3 IMPLANT
TUBE CONNECTING 20'X1/4 (TUBING)
TUBE CONNECTING 20X1/4 (TUBING) IMPLANT
UNDERPAD 30X30 (UNDERPADS AND DIAPERS) ×3 IMPLANT

## 2018-09-26 NOTE — Anesthesia Postprocedure Evaluation (Signed)
Anesthesia Post Note  Patient: Evelyn [name]  Procedure(s) Performed: RIGHT CARPAL TUNNEL RELEASE (Right Wrist)     Patient location during evaluation: PACU Anesthesia Type: Bier Block Level of consciousness: awake, awake and alert and oriented Pain management: pain level controlled Vital Signs Assessment: post-procedure vital signs reviewed and stable Respiratory status: spontaneous breathing, nonlabored ventilation and respiratory function stable Cardiovascular status: blood pressure returned to baseline Anesthetic complications: no    Last Vitals:  Vitals:   09/26/18 1241 09/26/18 1245  BP: 107/72 106/71  Pulse: 61 61  Resp: 20 19  Temp: 36.9 C   SpO2: 100% 100%    Last Pain:  Vitals:   09/26/18 1245  TempSrc:   PainSc: 0-No pain                 Cj Beecher COKER

## 2018-09-26 NOTE — H&P (Signed)
PREOPERATIVE H&P  Chief Complaint: right carpal tunnel syndrome  HPI: Evelyn [name] is a 32 y.o. female who presents for surgical treatment of right carpal tunnel syndrome.  She denies any changes in medical history.  Past Medical History:  Diagnosis Date  . Allergy   . Depression    post partum  . GERD (gastroesophageal reflux disease)   . HELLP syndrome    2015  . HSV (herpes simplex virus) infection   . Vaginal delivery 2004, 2009, 2011   Past Surgical History:  Procedure Laterality Date  . CESAREAN SECTION N/A 08/05/2014   Procedure: CESAREAN SECTION;  Surgeon: Jerelyn Charles, MD;  Location: [facility];  Service: Obstetrics;  Laterality: N/A;  . HERNIA REPAIR  2011  . LAPAROSCOPIC BILATERAL SALPINGECTOMY N/A 12/13/2015   Procedure: LAPAROSCOPIC BILATERAL SALPINGECTOMY;  Surgeon: Emily Filbert, MD;  Location: [facility];  Service: Gynecology;  Laterality: N/A;  . PERINEOPLASTY N/A 02/20/2016   Procedure: PERINEOPLASTY;  Surgeon: Emily Filbert, MD;  Location: [facility];  Service: Gynecology;  Laterality: N/A;  . PUBOVAGINAL SLING N/A 12/13/2015   Procedure: Gaynelle Arabian;  Surgeon: Emily Filbert, MD;  Location: [facility];  Service: Gynecology;  Laterality: N/A;  . RECTOCELE REPAIR N/A 02/20/2016   Procedure: POSTERIOR REPAIR (RECTOCELE);  Surgeon: Emily Filbert, MD;  Location: [facility];  Service: Gynecology;  Laterality: N/A;   Social History   Socioeconomic History  . Marital status: Married    Spouse name: Not on file  . Number of children: Not on file  . Years of education: Not on file  . Highest education level: Not on file  Occupational History  . Occupation: Not Employed  Social Needs  . Financial resource strain: Not on file  . Food insecurity:    Worry: Not on file    Inability: Not on file  . Transportation needs:    Medical: Not on file    Non-medical: Not on file  Tobacco Use  . Smoking status: Never Smoker  . Smokeless tobacco: Never Used  Substance and Sexual  Activity  . Alcohol use: Not Currently    Comment: occ  . Drug use: No  . Sexual activity: Yes    Partners: Male    Birth control/protection: Surgical  Lifestyle  . Physical activity:    Days per week: Not on file    Minutes per session: Not on file  . Stress: Not on file  Relationships  . Social connections:    Talks on phone: Not on file    Gets together: Not on file    Attends religious service: Not on file    Active member of club or organization: Not on file    Attends meetings of clubs or organizations: Not on file    Relationship status: Not on file  Other Topics Concern  . Not on file  Social History Narrative   Married. Pt does not exercise.   She works part-time at [employer] level of education: 12th grade   Family History  Problem Relation Age of Onset  . Asthma Son   . Heart disease Neg Hx   . Neurologic Disorder Neg Hx    No Known Allergies Prior to Admission medications   Medication Sig Start Date End Date Taking? Authorizing Provider  Cholecalciferol (VITAMIN D-3) 5000 units TABS Take 1 tablet by mouth daily. 09/05/18  Yes Hilts, Legrand Como, MD  levonorgestrel (MIRENA) 20 MCG/24HR IUD 1 each by Intrauterine  route once. 04/02/17   [provider]  valACYclovir (VALTREX) 1000 MG tablet Take 500 mg by mouth daily as needed (outbreak).     [provider]     Positive ROS: All other systems have been reviewed and were otherwise negative with the exception of those mentioned in the HPI and as above.  Physical Exam: General: Alert, no acute distress Cardiovascular: No pedal edema Respiratory: No cyanosis, no use of accessory musculature GI: abdomen soft Skin: No lesions in the area of chief complaint Neurologic: Sensation intact distally Psychiatric: Patient is competent for consent with normal mood and affect Lymphatic: no lymphedema  MUSCULOSKELETAL: exam stable  Assessment: right carpal tunnel syndrome  Plan: Plan  for Procedure(s): RIGHT CARPAL TUNNEL RELEASE  The risks benefits and alternatives were discussed with the patient including but not limited to the risks of nonoperative treatment, versus surgical intervention including infection, bleeding, nerve injury,  blood clots, cardiopulmonary complications, morbidity, mortality, among others, and they were willing to proceed.    Eduard Roux, MD   09/26/2018 11:52 AM

## 2018-09-26 NOTE — Transfer of Care (Signed)
Immediate Anesthesia Transfer of Care Note  Patient: Nelli Swalley  Procedure(s) Performed: RIGHT CARPAL TUNNEL RELEASE (Right Wrist)  Patient Location: PACU  Anesthesia Type:Bier block  Level of Consciousness: awake, alert  and oriented  Airway & Oxygen Therapy: Patient Spontanous Breathing  Post-op Assessment: Report given to RN and Post -op Vital signs reviewed and stable  Post vital signs: Reviewed and stable  Last Vitals:  Vitals Value Taken Time  BP    Temp    Pulse 61 09/26/2018 12:41 PM  Resp    SpO2 100 % 09/26/2018 12:41 PM  Vitals shown include unvalidated device data.  Last Pain:  Vitals:   09/26/18 1055  TempSrc: Oral  PainSc: 0-No pain         Complications: No apparent anesthesia complications

## 2018-09-26 NOTE — Op Note (Signed)
   Carpal tunnel op note  DATE OF SURGERY:09/26/2018  PREOPERATIVE DIAGNOSIS:  Right Carpal tunnel syndrome  POSTOPERATIVE DIAGNOSIS: same  PROCEDURE:  Right carpal tunnel release. CPT [phone]  SURGEON: Surgeon(s): Leandrew Koyanagi, MD  ASSIST: none  ANESTHESIA:  Regional  TOURNIQUET TIME: 20 mins  BLOOD LOSS: Minimal.  COMPLICATIONS: None.  PATHOLOGY: None.  INDICATIONS: The patient is a 32 y.o. -year-old female who presented with carpal tunnel syndrome failing nonsurgical management, indicated for surgical release.  DESCRIPTION OF PROCEDURE: The patient was identified in the preoperative holding area.  The operative site was marked by the surgeon and confirmed by the patient.  He was brought back to the operating room.  Anesthesia was induced by the anesthesia team.  A well padded nonsterile tourniquet was placed. The operative extremity was prepped and draped in standard sterile fashion.  A timeout was performed.  Preoperative antibiotics were given.   A palmar incision was made about 5 mm ulnar to the thenar crease.  The palmar aponeurosis was exposed and divided in line with the skin incision. The palmaris brevis was visualized and divided.  The distal edge of the transcarpal ligament was identified. A hemostat was inserted into the carpal tunnel to protect the median nerve and the flexor tendons. Then, the transverse carpal ligament was released under direct visualization. Proximally, a subcutaneous tunnel was made allowing a Sewell retractor to be placed. Then, the distal portion of the antebrachial fascia was released. Distally, all fibrous bands were released. The median nerve was visualized, and the fat pad was exposed. Following release, local infiltration with 0.25% of Sensorcaine was given. The tourniquet was deflated. Hemostasis achieved.  Wound was irrigated and closed with 4-0 nylon sutures. Sterile dressing applied. The patient was transferred to the recovery room in stable  condition after all counts were correct.  POSTOPERATIVE PLAN: To start nerve gliding exercises as tolerated and no heavy lifting for four weeks.

## 2018-09-26 NOTE — Discharge Instructions (Signed)
Postoperative instructions: ° °Weightbearing instructions: no heavy lifting ° °Dressing instructions: Keep your dressing and/or splint clean and dry at all times.  It will be removed at your first post-operative appointment.  Your stitches and/or staples will be removed at this visit. ° °Incision instructions:  Do not soak your incision for 3 weeks after surgery.  If the incision gets wet, pat dry and do not scrub the incision. ° °Pain control:  You have been given a prescription to be taken as directed for post-operative pain control.  In addition, elevate the operative extremity above the heart at all times to prevent swelling and throbbing pain. ° °Take over-the-counter Colace, 100mg by mouth twice a day while taking narcotic pain medications to help prevent constipation. ° °Follow up appointments: °1) 12-14 days for suture removal and wound check. °2) Dr. Xu as scheduled. ° ° ------------------------------------------------------------------------------------------------------------- ° °After Surgery Pain Control: ° °After your surgery, post-surgical discomfort or pain is likely. This discomfort can last several days to a few weeks. At certain times of the day your discomfort may be more intense.  °Did you receive a nerve block?  °A nerve block can provide pain relief for one hour to two days after your surgery. As long as the nerve block is working, you will experience little or no sensation in the area the surgeon operated on.  °As the nerve block wears off, you will begin to experience pain or discomfort. It is very important that you begin taking your prescribed pain medication before the nerve block fully wears off. Treating your pain at the first sign of the block wearing off will ensure your pain is better controlled and more tolerable when full-sensation returns. Do not wait until the pain is intolerable, as the medicine will be less effective. It is better to treat pain in advance than to try and catch  up.  °General Anesthesia:  °If you did not receive a nerve block during your surgery, you will need to start taking your pain medication shortly after your surgery and should continue to do so as prescribed by your surgeon.  °Pain Medication:  °Most commonly we prescribe Vicodin and Percocet for post-operative pain. Both of these medications contain a combination of acetaminophen (Tylenol®) and a narcotic to help control pain.  °· It takes between 30 and 45 minutes before pain medication starts to work. It is important to take your medication before your pain level gets too intense.  °· Nausea is a common side effect of many pain medications. You will want to eat something before taking your pain medicine to help prevent nausea.  °· If you are taking a prescription pain medication that contains acetaminophen, we recommend that you do not take additional over the counter acetaminophen (Tylenol®).  °Other pain relieving options:  °· Using a cold pack to ice the affected area a few times a day (15 to 20 minutes at a time) can help to relieve pain, reduce swelling and bruising.  °· Elevation of the affected area can also help to reduce pain and swelling. ° ° ° ° °Post Anesthesia Home Care Instructions ° °Activity: °Get plenty of rest for the remainder of the day. A responsible individual must stay with you for 24 hours following the procedure.  °For the next 24 hours, DO NOT: °-Drive a car °-Operate machinery °-Drink alcoholic beverages °-Take any medication unless instructed by your physician °-Make any legal decisions or sign important papers. ° °Meals: °Start with liquid foods such as gelatin   or soup. Progress to regular foods as tolerated. Avoid greasy, spicy, heavy foods. If nausea and/or vomiting occur, drink only clear liquids until the nausea and/or vomiting subsides. Call your physician if vomiting continues. ° °Special Instructions/Symptoms: °Your throat may feel dry or sore from the anesthesia or the  breathing tube placed in your throat during surgery. If this causes discomfort, gargle with warm salt water. The discomfort should disappear within 24 hours. ° °If you had a scopolamine patch placed behind your ear for the management of post- operative nausea and/or vomiting: ° °1. The medication in the patch is effective for 72 hours, after which it should be removed.  Wrap patch in a tissue and discard in the trash. Wash hands thoroughly with soap and water. °2. You may remove the patch earlier than 72 hours if you experience unpleasant side effects which may include dry mouth, dizziness or visual disturbances. °3. Avoid touching the patch. Wash your hands with soap and water after contact with the patch. °  ° °

## 2018-09-26 NOTE — Anesthesia Preprocedure Evaluation (Signed)
Anesthesia Evaluation  Patient identified by MRN, date of birth, ID band Patient awake    Reviewed: Allergy & Precautions, NPO status , Patient's Chart, lab work & pertinent test results  History of Anesthesia Complications Negative for: history of anesthetic complications  Airway Mallampati: II  TM Distance: >3 FB Neck ROM: Full    Dental no notable dental hx. (+) Dental Advisory Given   Pulmonary neg pulmonary ROS,    Pulmonary exam normal breath sounds clear to auscultation       Cardiovascular hypertension, Normal cardiovascular exam Rhythm:Regular Rate:Normal     Neuro/Psych PSYCHIATRIC DISORDERS Anxiety Depression negative neurological ROS     GI/Hepatic Neg liver ROS, GERD  ,  Endo/Other  negative endocrine ROS  Renal/GU negative Renal ROS  negative genitourinary   Musculoskeletal negative musculoskeletal ROS (+)   Abdominal   Peds negative pediatric ROS (+)  Hematology negative hematology ROS (+)   Anesthesia Other Findings   Reproductive/Obstetrics negative OB ROS                             Anesthesia Physical  Anesthesia Plan  ASA: II  Anesthesia Plan: General   Post-op Pain Management:    Induction: Intravenous  PONV Risk Score and Plan: 2  Airway Management Planned: Natural Airway, Nasal Cannula and Mask  Additional Equipment:   Intra-op Plan:   Post-operative Plan: Extubation in OR  Informed Consent: I have reviewed the patients History and Physical, chart, labs and discussed the procedure including the risks, benefits and alternatives for the proposed anesthesia with the patient or authorized representative who has indicated his/her understanding and acceptance.   Dental advisory given  Plan Discussed with: CRNA, Anesthesiologist and Surgeon  Anesthesia Plan Comments:         Anesthesia Quick Evaluation

## 2018-09-29 ENCOUNTER — Encounter (HOSPITAL_BASED_OUTPATIENT_CLINIC_OR_DEPARTMENT_OTHER): Payer: Self-pay | Admitting: Orthopaedic Surgery

## 2018-10-06 ENCOUNTER — Encounter: Payer: Self-pay | Admitting: Rheumatology

## 2018-10-06 ENCOUNTER — Ambulatory Visit: Payer: Medicaid Other | Admitting: Rheumatology

## 2018-10-06 VITALS — BP 106/72 | HR 75 | Resp 13 | Ht 61.0 in | Wt 133.0 lb

## 2018-10-06 DIAGNOSIS — I7300 Raynaud's syndrome without gangrene: Secondary | ICD-10-CM | POA: Diagnosis not present

## 2018-10-06 DIAGNOSIS — R5383 Other fatigue: Secondary | ICD-10-CM | POA: Diagnosis not present

## 2018-10-06 DIAGNOSIS — M2550 Pain in unspecified joint: Secondary | ICD-10-CM | POA: Diagnosis not present

## 2018-10-06 DIAGNOSIS — R768 Other specified abnormal immunological findings in serum: Secondary | ICD-10-CM | POA: Diagnosis not present

## 2018-10-06 DIAGNOSIS — Z79899 Other long term (current) drug therapy: Secondary | ICD-10-CM

## 2018-10-06 DIAGNOSIS — Z9889 Other specified postprocedural states: Secondary | ICD-10-CM

## 2018-10-06 DIAGNOSIS — M545 Low back pain: Secondary | ICD-10-CM

## 2018-10-06 DIAGNOSIS — G8929 Other chronic pain: Secondary | ICD-10-CM

## 2018-10-06 DIAGNOSIS — R21 Rash and other nonspecific skin eruption: Secondary | ICD-10-CM

## 2018-10-06 NOTE — Progress Notes (Signed)
Office Visit Note  Patient: Evelyn [name]             Date of Birth: 06-12-1986           MRN: [id number]             PCP: Nickola Major, MD Referring: Leandrew Koyanagi, MD Visit Date: 10/06/2018 Occupation: Staffing specialist  Subjective:  Positive ANA and multiple joint pain   History of Present Illness: Evelyn [name] is a 32 y.o. female seen in consultation per request of Dr. Erlinda Hong for evaluation of positive ANA.  According to patient her symptoms started in October 2019.  She states she has long-standing history of bilateral carpal tunnel syndrome.  She had left carpal tunnel release in 2017 and waited to have right carpal tunnel release.  She states in October she started having right arm pain and neck pain at the time she was seen by Dr.Xu to schedule surgery for right carpal tunnel release in September 26, 2018.  She states over time her neck pain got worse and she started having burning sensation in her right shoulder.  She was seen at [facility] urgent [facility].  From where she was advised to see Dr. Junius Roads.  She states Dr. Junius Roads did x-ray of the C-spine and placed her on prednisone 7-day taper along with muscle relaxers.  She MRI of the C-spine was a schedule.  She states the following week she developed pain in her right hip to the point she was limping and was having difficulty walking due to numbness and pain in her right lower extremity.  She had to go home from work.  Next day she went to the emergency room where she had MRI and was referred back to orthopedics.  She states Dr. Junius Roads referred her to physical therapy.  She was referred to a neurologist by emergency room where she had thorough evaluation and her exam was unremarkable and MRIs were unremarkable per patient.  She had labs by Dr. Erlinda Hong and her PCP which showed positive ANA.  She states she had right carpal tunnel release on September 26, 2018 she has not noticed any improvement as her right arm continues to  be numb.  She has been experiencing increased fatigue.  She has been also experiencing redness on her face and her fingers turning blue and purple.  She continues to have pain in multiple joints which she is describes in her neck, shoulders, right elbow, bilateral hands and wrist joints.  She also describes pain in her lower back, right hip, both knees, both ankles and feet.  She denies any joint swelling.  She has been experiencing nocturnal pain and decreased appetite.    Activities of Daily Living:  Patient reports morning stiffness for 1-2 hours.   Patient Reports nocturnal pain.  Difficulty dressing/grooming: Reports Difficulty climbing stairs: reports Difficulty getting out of chair: Denies Difficulty using hands for taps, buttons, cutlery, and/or writing: Reports  Review of Systems  Constitutional: Positive for appetite change, fatigue and weight loss. Negative for night sweats and weight gain.  HENT: Positive for mouth sores and mouth dryness. Negative for trouble swallowing, trouble swallowing and nose dryness.        Nasal ulcer  Eyes: Positive for dryness. Negative for pain, redness and visual disturbance.  Respiratory: Negative for cough, shortness of breath and difficulty breathing.   Cardiovascular: Positive for palpitations. Negative for chest pain, hypertension, irregular heartbeat and swelling in legs/feet.  Gastrointestinal: Positive  for constipation. Negative for blood in stool and diarrhea.  Endocrine: Negative for increased urination.  Genitourinary: Negative for vaginal dryness.  Musculoskeletal: Positive for arthralgias, joint pain and morning stiffness. Negative for joint swelling, myalgias, muscle weakness, muscle tenderness and myalgias.  Skin: Positive for color change, rash and sensitivity to sunlight. Negative for hair loss, skin tightness and ulcers.  Allergic/Immunologic: Negative for susceptible to infections.  Neurological: Negative for dizziness, memory  loss, night sweats and weakness.  Hematological: Negative for swollen glands.  Psychiatric/Behavioral: Positive for sleep disturbance. Negative for depressed mood. The patient is not nervous/anxious.     PMFS History:  Patient Active Problem List   Diagnosis Date Noted  . Carpal tunnel syndrome on right 09/26/2018  . Radicular pain in right arm 09/16/2018  . Stress incontinence 12/13/2015  . Encounter for female sterilization procedure 12/13/2015  . Hypertension in pregnancy 08/03/2014  . Preeclampsia 08/03/2014  . Chronic back pain 01/14/2014  . Allergic rhinitis 01/14/2014    Past Medical History:  Diagnosis Date  . Allergy   . Depression    post partum  . GERD (gastroesophageal reflux disease)   . HELLP syndrome    2015  . HSV (herpes simplex virus) infection   . Vaginal delivery 2004, 2009, 2011    Family History  Problem Relation Age of Onset  . Healthy Mother   . Healthy Sister   . Healthy Brother   . Asthma Son   . Healthy Son   . Healthy Son   . Healthy Son   . Healthy Daughter   . Heart disease Neg Hx   . Neurologic Disorder Neg Hx    Past Surgical History:  Procedure Laterality Date  . CARPAL TUNNEL RELEASE Right 09/26/2018   Procedure: RIGHT CARPAL TUNNEL RELEASE;  Surgeon: Leandrew Koyanagi, MD;  Location: [facility];  Service: Orthopedics;  Laterality: Right;  . CARPAL TUNNEL RELEASE Left 2017  . CESAREAN SECTION N/A 08/05/2014   Procedure: CESAREAN SECTION;  Surgeon: Jerelyn Charles, MD;  Location: [facility];  Service: Obstetrics;  Laterality: N/A;  . HERNIA REPAIR  2011  . KNEE SURGERY Left 2000  . LAPAROSCOPIC BILATERAL SALPINGECTOMY N/A 12/13/2015   Procedure: LAPAROSCOPIC BILATERAL SALPINGECTOMY;  Surgeon: Emily Filbert, MD;  Location: [facility];  Service: Gynecology;  Laterality: N/A;  . PERINEOPLASTY N/A 02/20/2016   Procedure: PERINEOPLASTY;  Surgeon: Emily Filbert, MD;  Location: [facility];  Service: Gynecology;  Laterality: N/A;  . PUBOVAGINAL SLING  N/A 12/13/2015   Procedure: Gaynelle Arabian;  Surgeon: Emily Filbert, MD;  Location: [facility];  Service: Gynecology;  Laterality: N/A;  . RECTOCELE REPAIR N/A 02/20/2016   Procedure: POSTERIOR REPAIR (RECTOCELE);  Surgeon: Emily Filbert, MD;  Location: [facility];  Service: Gynecology;  Laterality: N/A;   Social History   Social History Narrative   Married. Pt does not exercise.   She works part-time at [employer] level of education: 12th grade    Objective: Vital Signs: BP 106/72 (BP Location: Left Arm, Patient Position: Sitting, Cuff Size: Normal)   Pulse 75   Resp 13   Ht '5\' 1"'$  (1.549 m)   Wt 133 lb (60.3 kg)   BMI 25.13 kg/m    Physical Exam  Constitutional: She is oriented to person, place, and time. She appears well-developed and well-nourished.  HENT:  Head: Normocephalic and atraumatic.  Patient had few submandibular cervical lymph nodes which were palpable.  Eyes: Conjunctivae and EOM  are normal.  Neck: Normal range of motion.  Cardiovascular: Normal rate, regular rhythm, normal heart sounds and intact distal pulses.  Pulmonary/Chest: Effort normal and breath sounds normal.  Abdominal: Soft. Bowel sounds are normal.  Lymphadenopathy:    She has no cervical adenopathy.  Neurological: She is alert and oriented to person, place, and time.  Skin: Skin is warm and dry. Capillary refill takes less than 2 seconds. There is erythema.  On face  Psychiatric: She has a normal mood and affect. Her behavior is normal.  Nursing note and vitals reviewed.    Musculoskeletal Exam: C-spine good range of motion without discomfort.  She had painful range of motion of bilateral shoulder joints.  She had discomfort with range of motion of right elbow joint.  No synovitis was noted.  Her right hand was in a brace due to recent carpal tunnel release.  She has tenderness over wrist joints over MCPs and PIPs without synovitis.  She discomfort range of motion of her right hip joint.   Left hip joint knee joints ankles MTPs PIPs were in good range of motion.  CDAI Exam: CDAI Score: Not documented Patient Global Assessment: Not documented; Provider Global Assessment: Not documented Swollen: Not documented; Tender: Not documented Joint Exam   Not documented   There is currently no information documented on the homunculus. Go to the Rheumatology activity and complete the homunculus joint exam.  Investigation: No additional findings.  Imaging: Mr Brain Wo Contrast  Result Date: 09/08/2018 CLINICAL DATA:  Right neck, shoulder, and arm pain since last week. Left leg numbness. Right eye blurred vision and headache. EXAM: MRI HEAD WITHOUT CONTRAST MRI CERVICAL SPINE WITHOUT AND WITH CONTRAST MRI THORACIC SPINE WITHOUT CONTRAST TECHNIQUE: Multiplanar, multiecho pulse sequences of the brain and thoracic spine were obtained without intravenous contrast. Multiplanar, multiecho pulse sequences of the cervical spine, to include the craniocervical junction and cervicothoracic junction, were obtained without and with intravenous contrast. CONTRAST:  5 mL Gadavist COMPARISON:  Cervical spine radiographs 09/05/2018 FINDINGS: MRI HEAD FINDINGS Brain: There is no evidence of acute infarct, intracranial hemorrhage, mass, midline shift, or extra-axial fluid collection. The ventricles and sulci are normal. The brain is normal in signal. Vascular: Major intracranial vascular flow voids are preserved. Skull and upper cervical spine: Unremarkable calvarial bone marrow signal. Sinuses/Orbits: Unremarkable orbits. Paranasal sinuses and mastoid air cells are clear. Other: None. MRI CERVICAL SPINE FINDINGS Alignment: Cervical spine straightening.  No listhesis. Vertebrae: No fracture, suspicious osseous lesion, or significant marrow edema. Mildly diminished bone marrow T1 signal intensity diffusely, nonspecific though can be seen with anemia and smoking. Cord: No abnormal normal cord signal and morphology. No  abnormal intradural enhancement. Posterior Fossa, vertebral arteries, paraspinal tissues: Unremarkable. Disc levels: Intervertebral disc space heights are preserved. There is a diminutive central disc protrusion at C5-6 without stenosis or significant spinal cord mass effect. The other disc levels are unremarkable. MRI THORACIC SPINE FINDINGS Alignment:  Normal. Vertebrae: No fracture, suspicious osseous lesion, or significant marrow edema. Mildly diminished bone marrow T1 signal intensity diffusely as described above. Cord:  Normal signal and morphology. Paraspinal and other soft tissues: Unremarkable. Disc levels: Disc height and hydration are preserved throughout the thoracic spine. No disc herniation is identified, and the spinal canal and neural foramina are widely patent. IMPRESSION: 1. Negative brain MRI. 2. Tiny C5-6 central disc protrusion without stenosis. 3. Negative thoracic spine MRI. Electronically Signed   By: Logan Bores M.D.   On: 09/08/2018 21:26   Mr  Thoracic Spine Wo Contrast  Result Date: 09/08/2018 CLINICAL DATA:  Right neck, shoulder, and arm pain since last week. Left leg numbness. Right eye blurred vision and headache. EXAM: MRI HEAD WITHOUT CONTRAST MRI CERVICAL SPINE WITHOUT AND WITH CONTRAST MRI THORACIC SPINE WITHOUT CONTRAST TECHNIQUE: Multiplanar, multiecho pulse sequences of the brain and thoracic spine were obtained without intravenous contrast. Multiplanar, multiecho pulse sequences of the cervical spine, to include the craniocervical junction and cervicothoracic junction, were obtained without and with intravenous contrast. CONTRAST:  5 mL Gadavist COMPARISON:  Cervical spine radiographs 09/05/2018 FINDINGS: MRI HEAD FINDINGS Brain: There is no evidence of acute infarct, intracranial hemorrhage, mass, midline shift, or extra-axial fluid collection. The ventricles and sulci are normal. The brain is normal in signal. Vascular: Major intracranial vascular flow voids are  preserved. Skull and upper cervical spine: Unremarkable calvarial bone marrow signal. Sinuses/Orbits: Unremarkable orbits. Paranasal sinuses and mastoid air cells are clear. Other: None. MRI CERVICAL SPINE FINDINGS Alignment: Cervical spine straightening.  No listhesis. Vertebrae: No fracture, suspicious osseous lesion, or significant marrow edema. Mildly diminished bone marrow T1 signal intensity diffusely, nonspecific though can be seen with anemia and smoking. Cord: No abnormal normal cord signal and morphology. No abnormal intradural enhancement. Posterior Fossa, vertebral arteries, paraspinal tissues: Unremarkable. Disc levels: Intervertebral disc space heights are preserved. There is a diminutive central disc protrusion at C5-6 without stenosis or significant spinal cord mass effect. The other disc levels are unremarkable. MRI THORACIC SPINE FINDINGS Alignment:  Normal. Vertebrae: No fracture, suspicious osseous lesion, or significant marrow edema. Mildly diminished bone marrow T1 signal intensity diffusely as described above. Cord:  Normal signal and morphology. Paraspinal and other soft tissues: Unremarkable. Disc levels: Disc height and hydration are preserved throughout the thoracic spine. No disc herniation is identified, and the spinal canal and neural foramina are widely patent. IMPRESSION: 1. Negative brain MRI. 2. Tiny C5-6 central disc protrusion without stenosis. 3. Negative thoracic spine MRI. Electronically Signed   By: Logan Bores M.D.   On: 09/08/2018 21:26   Mr Cervical Spine W Wo Contrast  Result Date: 09/08/2018 CLINICAL DATA:  Right neck, shoulder, and arm pain since last week. Left leg numbness. Right eye blurred vision and headache. EXAM: MRI HEAD WITHOUT CONTRAST MRI CERVICAL SPINE WITHOUT AND WITH CONTRAST MRI THORACIC SPINE WITHOUT CONTRAST TECHNIQUE: Multiplanar, multiecho pulse sequences of the brain and thoracic spine were obtained without intravenous contrast. Multiplanar,  multiecho pulse sequences of the cervical spine, to include the craniocervical junction and cervicothoracic junction, were obtained without and with intravenous contrast. CONTRAST:  5 mL Gadavist COMPARISON:  Cervical spine radiographs 09/05/2018 FINDINGS: MRI HEAD FINDINGS Brain: There is no evidence of acute infarct, intracranial hemorrhage, mass, midline shift, or extra-axial fluid collection. The ventricles and sulci are normal. The brain is normal in signal. Vascular: Major intracranial vascular flow voids are preserved. Skull and upper cervical spine: Unremarkable calvarial bone marrow signal. Sinuses/Orbits: Unremarkable orbits. Paranasal sinuses and mastoid air cells are clear. Other: None. MRI CERVICAL SPINE FINDINGS Alignment: Cervical spine straightening.  No listhesis. Vertebrae: No fracture, suspicious osseous lesion, or significant marrow edema. Mildly diminished bone marrow T1 signal intensity diffusely, nonspecific though can be seen with anemia and smoking. Cord: No abnormal normal cord signal and morphology. No abnormal intradural enhancement. Posterior Fossa, vertebral arteries, paraspinal tissues: Unremarkable. Disc levels: Intervertebral disc space heights are preserved. There is a diminutive central disc protrusion at C5-6 without stenosis or significant spinal cord mass effect. The other disc levels are  unremarkable. MRI THORACIC SPINE FINDINGS Alignment:  Normal. Vertebrae: No fracture, suspicious osseous lesion, or significant marrow edema. Mildly diminished bone marrow T1 signal intensity diffusely as described above. Cord:  Normal signal and morphology. Paraspinal and other soft tissues: Unremarkable. Disc levels: Disc height and hydration are preserved throughout the thoracic spine. No disc herniation is identified, and the spinal canal and neural foramina are widely patent. IMPRESSION: 1. Negative brain MRI. 2. Tiny C5-6 central disc protrusion without stenosis. 3. Negative thoracic  spine MRI. Electronically Signed   By: Logan Bores M.D.   On: 09/08/2018 21:26    Recent Labs: Lab Results  Component Value Date   WBC 13.5 (H) 09/08/2018   HGB 12.4 09/08/2018   PLT 315 09/08/2018   NA 136 09/08/2018   K 3.6 09/08/2018   CL 103 09/08/2018   CO2 25 09/08/2018   GLUCOSE 131 (H) 09/08/2018   BUN 13 09/08/2018   CREATININE 0.64 09/08/2018   BILITOT 0.8 02/19/2016   ALKPHOS 119 02/19/2016   AST 21 02/19/2016   ALT 18 02/19/2016   PROT 8.3 (H) 02/19/2016   ALBUMIN 4.7 02/19/2016   CALCIUM 9.5 09/08/2018   GFRAA >60 09/08/2018  September 16, 2018 ANA 1: 640 nucleolar homogenous, ESR 25, RF negative, uric acid 5.8 Speciality Comments: No specialty comments available.  Procedures:  No procedures performed Allergies: Patient has no known allergies.   Assessment / Plan:     Visit Diagnoses: Positive ANA (antinuclear antibody) -patient gives history of nasal ulcers, oral ulcers, lymphadenopathy, fatigue, weight loss, decreased appetite, facial rash, photosensitivity, rainouts phenomenon.  She has positive ANA 1: 640 homogeneous pattern.  I will obtain following labs today.  Plan: Urinalysis, Routine w reflex microscopic, Anti-scleroderma antibody, RNP Antibody, Anti-Smith antibody, Sjogrens syndrome-A extractable nuclear antibody, Sjogrens syndrome-B extractable nuclear antibody, Anti-DNA antibody, double-stranded, C3 and C4, Beta-2 glycoprotein antibodies, Cardiolipin antibodies, IgG, IgM, IgA, Lupus Anticoagulant Eval w/Reflex.  Advised sunscreen.  Polyarthralgia-she complains of polyarthralgia and had discomfort range of motion of her right hip joint.  No synovitis was noted on examination today.  Other fatigue -she has been experiencing increased fatigue.  She also complains of decreased appetite.  Plan: CBC with Differential/Platelet, COMPLETE METABOLIC PANEL WITH GFR, TSH, VITAMIN D 25 Hydroxy (Vit-D Deficiency, Fractures), CK, Glucose 6 phosphate dehydrogenase, Serum  protein electrophoresis with reflex  Raynaud's disease without gangrene-patient complains of Raynolds.  She had good capillary refill on examination today.  Rash-she has facial erythema on examination.  Chronic midline low back pain without sciatica-chronic lower back pain has been going on for many years.  History of bilateral carpal tunnel release-right carpal tunnel release September 26, 2018 and left carpal tunnel release 2017.  High risk medication use - Plan: Hepatitis B core antibody, IgM, Hepatitis B surface antigen, Hepatitis C antibody, QuantiFERON-TB Gold Plus, HIV Antibody (routine testing w rflx), IgG, IgA, IgM, Thiopurine methyltransferase(tpmt)rbc   Orders: Orders Placed This Encounter  Procedures  . CBC with Differential/Platelet  . COMPLETE METABOLIC PANEL WITH GFR  . Urinalysis, Routine w reflex microscopic  . TSH  . VITAMIN D 25 Hydroxy (Vit-D Deficiency, Fractures)  . CK  . Anti-scleroderma antibody  . RNP Antibody  . Anti-Smith antibody  . Sjogrens syndrome-A extractable nuclear antibody  . Sjogrens syndrome-B extractable nuclear antibody  . Anti-DNA antibody, double-stranded  . C3 and C4  . Beta-2 glycoprotein antibodies  . Cardiolipin antibodies, IgG, IgM, IgA  . Lupus Anticoagulant Eval w/Reflex  . Glucose 6 phosphate dehydrogenase  .  Serum protein electrophoresis with reflex  . Hepatitis B core antibody, IgM  . Hepatitis B surface antigen  . Hepatitis C antibody  . QuantiFERON-TB Gold Plus  . HIV Antibody (routine testing w rflx)  . IgG, IgA, IgM  . Thiopurine methyltransferase(tpmt)rbc   No orders of the defined types were placed in this encounter.   Face-to-face time spent with patient was 60 minutes. Greater than 50% of time was spent in counseling and coordination of care.  Follow-Up Instructions: Return for +ANA,.   Bo Merino, MD  Note - This record has been created using Editor, commissioning.  Chart creation errors have been sought,  but may not always  have been located. Such creation errors do not reflect on  the standard of medical care.

## 2018-10-08 DIAGNOSIS — M359 Systemic involvement of connective tissue, unspecified: Secondary | ICD-10-CM | POA: Insufficient documentation

## 2018-10-08 NOTE — Progress Notes (Signed)
Office Visit Note  Patient: Evelyn [name]             Date of Birth: Aug 10, 1986           MRN: [id number]             PCP: Nickola Major, MD Referring: Nickola Major, MD Visit Date: 10/13/2018 Occupation: _0 @  Subjective:  Fatigue and joint pain.   History of Present Illness: Evelyn [name] is a 32 y.o. female with history of autoimmune disease.  She continues to have fatigue.  She also has some dry mouth and dry eyes with sores in her mouth.  She has  been experiencing some discomfort in the right trapezius area and right shoulder.  Her right shoulder is causing pain at night.  She continues to have joint pain and discomfort but no joint swelling.  She complains of pain in her bilateral hands and her right hip joint.  The Raynauds has not been as severe.  The malar rash persist.  Activities of Daily Living:  Patient reports morning stiffness for 1-2 hours.   Patient Reports nocturnal pain.  Difficulty dressing/grooming: Reports Difficulty climbing stairs: Reports Difficulty getting out of chair: Denies Difficulty using hands for taps, buttons, cutlery, and/or writing: Reports  Review of Systems  Constitutional: Positive for fatigue. Negative for appetite change, night sweats, weight gain and weight loss.  HENT: Positive for mouth sores and mouth dryness. Negative for trouble swallowing, trouble swallowing and nose dryness.   Eyes: Positive for dryness. Negative for pain, redness, itching and visual disturbance.  Respiratory: Negative for cough, shortness of breath, wheezing and difficulty breathing.   Cardiovascular: Negative for chest pain, palpitations, hypertension, irregular heartbeat and swelling in legs/feet.  Gastrointestinal: Positive for constipation. Negative for abdominal pain, blood in stool, diarrhea, nausea and vomiting.  Endocrine: Negative for increased urination.  Genitourinary: Negative for painful urination, nocturia, pelvic pain and  vaginal dryness.  Musculoskeletal: Positive for morning stiffness. Negative for arthralgias, joint pain, joint swelling, myalgias, muscle weakness, muscle tenderness and myalgias.  Skin: Positive for rash and hair loss. Negative for color change, skin tightness, ulcers and sensitivity to sunlight.  Allergic/Immunologic: Negative for susceptible to infections.  Neurological: Positive for headaches. Negative for dizziness, light-headedness, memory loss, night sweats and weakness.  Hematological: Negative for bruising/bleeding tendency and swollen glands.  Psychiatric/Behavioral: Negative for depressed mood, confusion and sleep disturbance. The patient is not nervous/anxious.     PMFS History:  Patient Active Problem List   Diagnosis Date Noted  . Autoimmune disease ([place]) 10/08/2018  . Carpal tunnel syndrome, right upper limb 09/26/2018  . Radicular pain in right arm 09/16/2018  . Stress incontinence 12/13/2015  . Encounter for female sterilization procedure 12/13/2015  . Hypertension in pregnancy 08/03/2014  . Preeclampsia 08/03/2014  . Chronic back pain 01/14/2014  . Allergic rhinitis 01/14/2014    Past Medical History:  Diagnosis Date  . Allergy   . Depression    post partum  . GERD (gastroesophageal reflux disease)   . HELLP syndrome    2015  . HSV (herpes simplex virus) infection   . Vaginal delivery 2004, 2009, 2011    Family History  Problem Relation Age of Onset  . Healthy Mother   . Healthy Sister   . Healthy Brother   . Asthma Son   . Healthy Son   . Healthy Son   . Healthy Son   . Healthy Daughter   . Heart disease Neg Hx   .  Neurologic Disorder Neg Hx    Past Surgical History:  Procedure Laterality Date  . CARPAL TUNNEL RELEASE Right 09/26/2018   Procedure: RIGHT CARPAL TUNNEL RELEASE;  Surgeon: Leandrew Koyanagi, MD;  Location: [facility];  Service: Orthopedics;  Laterality: Right;  . CARPAL TUNNEL RELEASE Left 2017  . CESAREAN SECTION N/A  08/05/2014   Procedure: CESAREAN SECTION;  Surgeon: Jerelyn Charles, MD;  Location: [facility];  Service: Obstetrics;  Laterality: N/A;  . HERNIA REPAIR  2011  . KNEE SURGERY Left 2000  . LAPAROSCOPIC BILATERAL SALPINGECTOMY N/A 12/13/2015   Procedure: LAPAROSCOPIC BILATERAL SALPINGECTOMY;  Surgeon: Emily Filbert, MD;  Location: [facility];  Service: Gynecology;  Laterality: N/A;  . PERINEOPLASTY N/A 02/20/2016   Procedure: PERINEOPLASTY;  Surgeon: Emily Filbert, MD;  Location: [facility];  Service: Gynecology;  Laterality: N/A;  . PUBOVAGINAL SLING N/A 12/13/2015   Procedure: Gaynelle Arabian;  Surgeon: Emily Filbert, MD;  Location: [facility];  Service: Gynecology;  Laterality: N/A;  . RECTOCELE REPAIR N/A 02/20/2016   Procedure: POSTERIOR REPAIR (RECTOCELE);  Surgeon: Emily Filbert, MD;  Location: [facility];  Service: Gynecology;  Laterality: N/A;   Social History   Social History Narrative   Married. Pt does not exercise.   She works part-time at [employer] level of education: 12th grade    Objective: Vital Signs: BP 118/64 (BP Location: Left Arm, Patient Position: Sitting, Cuff Size: Normal)   Pulse 72   Resp 12   Ht _0  (1.549 m)   Wt 133 lb (60.3 kg)   BMI 25.13 kg/m    Physical Exam  Constitutional: She is oriented to person, place, and time. She appears well-developed and well-nourished.  HENT:  Head: Normocephalic and atraumatic.  Palpable cervical lymph nodes in the submandibular region  Eyes: Conjunctivae and EOM are normal.  Neck: Normal range of motion.  Cardiovascular: Normal rate, regular rhythm, normal heart sounds and intact distal pulses.  Pulmonary/Chest: Effort normal and breath sounds normal.  Abdominal: Soft. Bowel sounds are normal.  Lymphadenopathy:    She has no cervical adenopathy.  Neurological: She is alert and oriented to person, place, and time.  Skin: Skin is warm and dry. Capillary refill takes less than 2 seconds.  Positive malar rash  Psychiatric: She  has a normal mood and affect. Her behavior is normal.  Nursing note and vitals reviewed.    Musculoskeletal Exam: C-spine thoracic lumbar spine good range of motion.  She has painful range of motion of her right shoulder joint and right trapezius spasm.  Elbow joints wrist joint MCPs PIPs DIPs were in good range of motion.  She is some swelling over right fifth PIP joint.  Hip joints were in good range of motion.  She discomfort range of motion of her right hip joint.  Knee joints ankles MTPs PIPs were in good range of motion with no synovitis.  CDAI Exam: CDAI Score: Not documented Patient Global Assessment: Not documented; Provider Global Assessment: Not documented Swollen: Not documented; Tender: Not documented Joint Exam   Not documented   There is currently no information documented on the homunculus. Go to the Rheumatology activity and complete the homunculus joint exam.  Investigation: Findings:  September 16, 2018 ANA 1: 640 nucleolar homogenous, ESR 25, RF negative, uric acid 5.8   Imaging: Xr Hip Unilat W Or W/o Pelvis 2-3 Views Right  Result Date: 10/13/2018 No hip joint narrowing was noted.  No SI  joint changes were noted.  No chondrocalcinosis was noted. Impression: Unremarkable x-ray of the hip joint.  Xr Hand 2 View Left  Result Date: 10/13/2018 No MCP, PIP or DIP narrowing was noted.  No intercarpal joint space narrowing was noted.  No erosive changes were noted.  No erosive changes were noted. Impression: Unremarkable x-ray of the hand.  Xr Hand 2 View Right  Result Date: 10/13/2018 No MCP, PIP or DIP narrowing was noted.  No intercarpal joint space narrowing was noted.  No erosive changes were noted.  No erosive changes were noted. Impression: Unremarkable x-ray of the hand.  Xr Shoulder Right  Result Date: 10/13/2018 No glenohumeral joint space narrowing was noted.  No acromioclavicular joint space narrowing was noted.  No chondrocalcinosis was noted.  Impression: Unremarkable x-ray of the shoulder joint.   Recent Labs: Lab Results  Component Value Date   WBC 5.0 10/06/2018   HGB 12.2 10/06/2018   PLT 318 10/06/2018   NA 139 10/06/2018   K 4.4 10/06/2018   CL 103 10/06/2018   CO2 22 10/06/2018   GLUCOSE 77 10/06/2018   BUN 11 10/06/2018   CREATININE 0.61 10/06/2018   BILITOT 0.4 10/06/2018   ALKPHOS 119 02/19/2016   AST 16 10/06/2018   ALT 9 10/06/2018   PROT 7.3 10/06/2018   PROT 7.5 10/06/2018   ALBUMIN 4.7 02/19/2016   CALCIUM 9.5 10/06/2018   GFRAA 140 10/06/2018   QFTBGOLDPLUS NEGATIVE 10/06/2018  UA negative, CK 65, TSH normal, vitamin D 30, hepatitis B negative, hepatitis C negative, HIV negative, G6PD normal, T PMT normal, immunoglobulins normal, SPEP pending dsDNA negative, SSA >8.0, SSB negative, Smith negative, RNP negative, SCL 70-, beta-2 negative, anticardiolipin negative, C3-C4 normal  Speciality Comments: No specialty comments available.  Procedures:  Large Joint Inj: R glenohumeral on 10/13/2018 12:38 PM Indications: pain Details: 27 G 1.5 in needle, posterior approach  Arthrogram: No  Medications: 1 mL lidocaine 1 %; 40 mg triamcinolone acetonide 40 MG/ML Aspirate: 0 mL Outcome: tolerated well, no immediate complications Procedure, treatment alternatives, risks and benefits explained, specific risks discussed. Consent was given by the patient. Immediately prior to procedure a time out was called to verify the correct patient, procedure, equipment, support staff and site/side marked as required. Patient was prepped and draped in the usual sterile fashion.     Allergies: Patient has no known allergies.   Assessment / Plan:     Visit Diagnoses: Other systemic lupus erythematosus with other organ involvement (HCC) - Positive ANA, positive SSA, history of oral ulcers, nasal ulcers, lymphadenopathy, fatigue, weight loss, facial rash, photosensitivity, Raynauds phenomenon.  Detailed counseling regarding  systemic lupus erythematosus was provided.  After discussing different treatment options we decided to proceed with Plaquenil.  Indications side effects contraindications were discussed.  She was given a prescription for Plaquenil 200 mg p.o. twice daily Monday to Friday.  She will need baseline eye exam then examination on yearly basis.  She will need labs in a month and then 3 months and then every 5 months.  She was advised to get flu vaccine and pneumococcal vaccine.  Use of sunscreen was also discussed.  I have given her a prednisone taper as a bridging therapy.  Side effects of prednisone were discussed.  Raynaud's disease without gangrene-currently not very active.  Chronic right shoulder pain -she has been having pain and discomfort in her right shoulder and having difficulty sleeping.  Plan: XR Shoulder Right.  The x-ray of the shoulder was unremarkable.  Per her request the right shoulder joint was injected with cortisone which she tolerated well.  Pain in both hands -she had no synovitis on examination today.  Plan: XR Hand 2 View Right, XR Hand 2 View Left.  The x-ray of bilateral hands were unremarkable.  Pain in right hip -she is been having discomfort in her right hip.  Plan: XR HIP UNILAT W OR W/O PELVIS 2-3 VIEWS RIGHT.  The x-ray of the hip joint was unremarkable.  High risk medication use-she will be starting Plaquenil 200 mg p.o. Monday to Friday.  Prednisone taper was given as described below.  Chronic midline low back pain without sciatica-need for regular exercise was discussed.  History of bilateral carpal tunnel release   History of HELLP syndrome  Orders: Orders Placed This Encounter  Procedures  . Large Joint Inj  . XR Hand 2 View Right  . XR Hand 2 View Left  . XR HIP UNILAT W OR W/O PELVIS 2-3 VIEWS RIGHT  . XR Shoulder Right   Meds ordered this encounter  Medications  . predniSONE (DELTASONE) 5 MG tablet    Sig: Take 4 tabs po x 4 days, 3  tabs po x 4 days,  2  tabs po x 4 days, 1  tab po x 4 days    Dispense:  40 tablet    Refill:  0  . hydroxychloroquine (PLAQUENIL) 200 MG tablet    Sig: Take 1 tablet 200 mg BID Monday-Friday    Dispense:  40 tablet    Refill:  1    Face-to-face time spent with patient was 30 minutes. Greater than 50% of time was spent in counseling and coordination of care.  Follow-Up Instructions: Return in about 4 weeks (around 11/10/2018) for Systemic lupus.   Bo Merino, MD  Note - This record has been created using Editor, commissioning.  Chart creation errors have been sought, but may not always  have been located. Such creation errors do not reflect on  the standard of medical care.

## 2018-10-09 ENCOUNTER — Encounter (INDEPENDENT_AMBULATORY_CARE_PROVIDER_SITE_OTHER): Payer: Self-pay | Admitting: Orthopaedic Surgery

## 2018-10-09 ENCOUNTER — Ambulatory Visit (INDEPENDENT_AMBULATORY_CARE_PROVIDER_SITE_OTHER): Payer: Medicaid Other | Admitting: Physician Assistant

## 2018-10-09 DIAGNOSIS — G5601 Carpal tunnel syndrome, right upper limb: Secondary | ICD-10-CM

## 2018-10-09 NOTE — Progress Notes (Signed)
Post-Op Visit Note   Patient: Evelyn [name]           Date of Birth: 1986-03-14           MRN: [id number] Visit Date: 10/09/2018 PCP: Nickola Major, MD   Assessment & Plan:  Chief Complaint:  Chief Complaint  Patient presents with  . Right Hand - Follow-up   Visit Diagnoses:  1. Carpal tunnel syndrome, right upper limb     Plan: Patient is a pleasant 32 year old female presents to our clinic today 13 days status post right carpal tunnel release, date of surgery 09/26/2018.  She has been doing very well since surgery.  Minimal to no pain.  No fevers or chills.  Full sensation to all 5 fingers, although she does note slight decreased sensation to the palm.  Examination of her right hand reveals a well-healing surgical incision with nylon sutures in place.  No evidence of infection or cellulitis.  Today, nylon sutures were removed and Steri-Strips applied.  She has been advised to not lift anything heavy for the next 2 weeks.  No soaking her hand in any water for 2 weeks.  Follow-up with [place] in 2 weeks time for recheck.  Follow-Up Instructions: Return in about 2 weeks (around 10/23/2018).   Orders:  No orders of the defined types were placed in this encounter.  No orders of the defined types were placed in this encounter.   Imaging: No new imaging  PMFS History: Patient Active Problem List   Diagnosis Date Noted  . Autoimmune disease ([place]) 10/08/2018  . Carpal tunnel syndrome, right upper limb 09/26/2018  . Radicular pain in right arm 09/16/2018  . Stress incontinence 12/13/2015  . Encounter for female sterilization procedure 12/13/2015  . Hypertension in pregnancy 08/03/2014  . Preeclampsia 08/03/2014  . Chronic back pain 01/14/2014  . Allergic rhinitis 01/14/2014   Past Medical History:  Diagnosis Date  . Allergy   . Depression    post partum  . GERD (gastroesophageal reflux disease)   . HELLP syndrome    2015  . HSV (herpes simplex virus) infection     . Vaginal delivery 2004, 2009, 2011    Family History  Problem Relation Age of Onset  . Healthy Mother   . Healthy Sister   . Healthy Brother   . Asthma Son   . Healthy Son   . Healthy Son   . Healthy Son   . Healthy Daughter   . Heart disease Neg Hx   . Neurologic Disorder Neg Hx     Past Surgical History:  Procedure Laterality Date  . CARPAL TUNNEL RELEASE Right 09/26/2018   Procedure: RIGHT CARPAL TUNNEL RELEASE;  Surgeon: Leandrew Koyanagi, MD;  Location: [facility];  Service: Orthopedics;  Laterality: Right;  . CARPAL TUNNEL RELEASE Left 2017  . CESAREAN SECTION N/A 08/05/2014   Procedure: CESAREAN SECTION;  Surgeon: Jerelyn Charles, MD;  Location: [facility];  Service: Obstetrics;  Laterality: N/A;  . HERNIA REPAIR  2011  . KNEE SURGERY Left 2000  . LAPAROSCOPIC BILATERAL SALPINGECTOMY N/A 12/13/2015   Procedure: LAPAROSCOPIC BILATERAL SALPINGECTOMY;  Surgeon: Emily Filbert, MD;  Location: [facility];  Service: Gynecology;  Laterality: N/A;  . PERINEOPLASTY N/A 02/20/2016   Procedure: PERINEOPLASTY;  Surgeon: Emily Filbert, MD;  Location: [facility];  Service: Gynecology;  Laterality: N/A;  . PUBOVAGINAL SLING N/A 12/13/2015   Procedure: Gaynelle Arabian;  Surgeon: Emily Filbert, MD;  Location: [facility];  Service: Gynecology;  Laterality: N/A;  . RECTOCELE REPAIR N/A 02/20/2016   Procedure: POSTERIOR REPAIR (RECTOCELE);  Surgeon: Emily Filbert, MD;  Location: [facility];  Service: Gynecology;  Laterality: N/A;   Social History   Occupational History  . Occupation: Not Employed  Tobacco Use  . Smoking status: Never Smoker  . Smokeless tobacco: Never Used  Substance and Sexual Activity  . Alcohol use: Not Currently    Comment: occ  . Drug use: No  . Sexual activity: Yes    Partners: Male    Birth control/protection: Surgical

## 2018-10-09 NOTE — Progress Notes (Signed)
I will discuss labs at the follow-up visit.

## 2018-10-13 ENCOUNTER — Ambulatory Visit (INDEPENDENT_AMBULATORY_CARE_PROVIDER_SITE_OTHER): Payer: Self-pay

## 2018-10-13 ENCOUNTER — Ambulatory Visit (INDEPENDENT_AMBULATORY_CARE_PROVIDER_SITE_OTHER): Payer: Medicaid Other | Admitting: Rheumatology

## 2018-10-13 ENCOUNTER — Ambulatory Visit (INDEPENDENT_AMBULATORY_CARE_PROVIDER_SITE_OTHER): Payer: Medicaid Other

## 2018-10-13 ENCOUNTER — Encounter: Payer: Self-pay | Admitting: Physician Assistant

## 2018-10-13 VITALS — BP 118/64 | HR 72 | Resp 12 | Ht 61.0 in | Wt 133.0 lb

## 2018-10-13 DIAGNOSIS — Z9889 Other specified postprocedural states: Secondary | ICD-10-CM

## 2018-10-13 DIAGNOSIS — M79642 Pain in left hand: Secondary | ICD-10-CM

## 2018-10-13 DIAGNOSIS — M79641 Pain in right hand: Secondary | ICD-10-CM

## 2018-10-13 DIAGNOSIS — M25551 Pain in right hip: Secondary | ICD-10-CM

## 2018-10-13 DIAGNOSIS — Z79899 Other long term (current) drug therapy: Secondary | ICD-10-CM

## 2018-10-13 DIAGNOSIS — I7300 Raynaud's syndrome without gangrene: Secondary | ICD-10-CM | POA: Diagnosis not present

## 2018-10-13 DIAGNOSIS — G8929 Other chronic pain: Secondary | ICD-10-CM

## 2018-10-13 DIAGNOSIS — M545 Low back pain, unspecified: Secondary | ICD-10-CM

## 2018-10-13 DIAGNOSIS — M3219 Other organ or system involvement in systemic lupus erythematosus: Secondary | ICD-10-CM

## 2018-10-13 DIAGNOSIS — M25511 Pain in right shoulder: Secondary | ICD-10-CM | POA: Diagnosis not present

## 2018-10-13 LAB — CBC WITH DIFFERENTIAL/PLATELET
BASOS ABS: 30 {cells}/uL (ref 0–200)
Basophils Relative: 0.6 %
EOS ABS: 90 {cells}/uL (ref 15–500)
Eosinophils Relative: 1.8 %
HEMATOCRIT: 35.8 % (ref 35.0–45.0)
HEMOGLOBIN: 12.2 g/dL (ref 11.7–15.5)
LYMPHS ABS: 1655 {cells}/uL (ref 850–3900)
MCH: 29.9 pg (ref 27.0–33.0)
MCHC: 34.1 g/dL (ref 32.0–36.0)
MCV: 87.7 fL (ref 80.0–100.0)
MPV: 11 fL (ref 7.5–12.5)
Monocytes Relative: 6.5 %
Neutro Abs: 2900 cells/uL (ref 1500–7800)
Neutrophils Relative %: 58 %
Platelets: 318 10*3/uL (ref 140–400)
RBC: 4.08 10*6/uL (ref 3.80–5.10)
RDW: 12.5 % (ref 11.0–15.0)
Total Lymphocyte: 33.1 %
WBC: 5 10*3/uL (ref 3.8–10.8)
WBCMIX: 325 {cells}/uL (ref 200–950)

## 2018-10-13 LAB — URINALYSIS, ROUTINE W REFLEX MICROSCOPIC
BACTERIA UA: NONE SEEN /HPF
Bilirubin Urine: NEGATIVE
Glucose, UA: NEGATIVE
HYALINE CAST: NONE SEEN /LPF
Ketones, ur: NEGATIVE
Nitrite: NEGATIVE
PH: 5.5 (ref 5.0–8.0)
PROTEIN: NEGATIVE
RBC / HPF: NONE SEEN /HPF (ref 0–2)
SQUAMOUS EPITHELIAL / LPF: NONE SEEN /HPF (ref <=5)
Specific Gravity, Urine: 1.015 (ref 1.001–1.03)

## 2018-10-13 LAB — C3 AND C4
C3 Complement: 122 mg/dL (ref 83–193)
C4 Complement: 28 mg/dL (ref 15–57)

## 2018-10-13 LAB — COMPLETE METABOLIC PANEL WITH GFR
AG Ratio: 1.5 (calc) (ref 1.0–2.5)
ALT: 9 U/L (ref 6–29)
AST: 16 U/L (ref 10–30)
Albumin: 4.4 g/dL (ref 3.6–5.1)
Alkaline phosphatase (APISO): 90 U/L (ref 33–115)
BUN: 11 mg/dL (ref 7–25)
CALCIUM: 9.5 mg/dL (ref 8.6–10.2)
CHLORIDE: 103 mmol/L (ref 98–110)
CO2: 22 mmol/L (ref 20–32)
CREATININE: 0.61 mg/dL (ref 0.50–1.10)
GFR, EST AFRICAN AMERICAN: 140 mL/min/{1.73_m2} (ref >=60)
GFR, EST NON AFRICAN AMERICAN: 121 mL/min/{1.73_m2} (ref >=60)
GLUCOSE: 77 mg/dL (ref 65–99)
Globulin: 2.9 g/dL (calc) (ref 1.9–3.7)
Potassium: 4.4 mmol/L (ref 3.5–5.3)
Sodium: 139 mmol/L (ref 135–146)
TOTAL PROTEIN: 7.3 g/dL (ref 6.1–8.1)
Total Bilirubin: 0.4 mg/dL (ref 0.2–1.2)

## 2018-10-13 LAB — LUPUS ANTICOAGULANT EVAL W/ REFLEX
DRVVT: 34 s (ref <=45)
PTT-LA SCREEN: 39 s (ref <=40)

## 2018-10-13 LAB — BETA-2 GLYCOPROTEIN ANTIBODIES: Beta-2 Glyco 1 IgA: <9 SAU (ref <=20)

## 2018-10-13 LAB — HEPATITIS B SURFACE ANTIGEN: Hepatitis B Surface Ag: NONREACTIVE

## 2018-10-13 LAB — ANTI-SCLERODERMA ANTIBODY: Scleroderma (Scl-70) (ENA) Antibody, IgG: <1 AI

## 2018-10-13 LAB — SJOGRENS SYNDROME-A EXTRACTABLE NUCLEAR ANTIBODY: SSA (Ro) (ENA) Antibody, IgG: >8 AI — AB

## 2018-10-13 LAB — ANTI-SMITH ANTIBODY: ENA SM AB SER-ACNC: NEGATIVE AI

## 2018-10-13 LAB — GLUCOSE 6 PHOSPHATE DEHYDROGENASE: G-6PDH: 17 U/g{Hb} (ref 7.0–20.5)

## 2018-10-13 LAB — CARDIOLIPIN ANTIBODIES, IGG, IGM, IGA
Anticardiolipin IgA: <11 [APL'U]
Anticardiolipin IgG: <14 [GPL'U]

## 2018-10-13 LAB — VITAMIN D 25 HYDROXY (VIT D DEFICIENCY, FRACTURES): VIT D 25 HYDROXY: 30 ng/mL (ref 30–100)

## 2018-10-13 LAB — IGG, IGA, IGM
IGG (IMMUNOGLOBIN G), SERUM: 1438 mg/dL (ref 600–1640)
IgM, Serum: 271 mg/dL (ref 50–300)
Immunoglobulin A: 163 mg/dL (ref 47–310)

## 2018-10-13 LAB — QUANTIFERON-TB GOLD PLUS
NIL: 0.05 IU/mL
QUANTIFERON-TB GOLD PLUS: NEGATIVE

## 2018-10-13 LAB — HEPATITIS C ANTIBODY
HEP C AB: NONREACTIVE
SIGNAL TO CUT-OFF: 0.02 (ref <1.00)

## 2018-10-13 LAB — PROTEIN ELECTROPHORESIS, SERUM, WITH REFLEX
ALBUMIN ELP: 4.4 g/dL (ref 3.8–4.8)
Alpha 1: 0.3 g/dL (ref 0.2–0.3)
Alpha 2: 0.7 g/dL (ref 0.5–0.9)
BETA 2: 0.3 g/dL (ref 0.2–0.5)
Beta Globulin: 0.4 g/dL (ref 0.4–0.6)
GAMMA GLOBULIN: 1.4 g/dL (ref 0.8–1.7)
Total Protein: 7.5 g/dL (ref 6.1–8.1)

## 2018-10-13 LAB — THIOPURINE METHYLTRANSFERASE (TPMT), RBC: Thiopurine Methyltransferase, RBC: 14 nmol/hr/mL RBC

## 2018-10-13 LAB — CK: Total CK: 65 U/L (ref 29–143)

## 2018-10-13 LAB — RNP ANTIBODY: Ribonucleic Protein(ENA) Antibody, IgG: <1 AI

## 2018-10-13 LAB — SJOGRENS SYNDROME-B EXTRACTABLE NUCLEAR ANTIBODY: SSB (La) (ENA) Antibody, IgG: <1 AI

## 2018-10-13 LAB — HEPATITIS B CORE ANTIBODY, IGM: Hep B C IgM: NONREACTIVE

## 2018-10-13 LAB — ANTI-DNA ANTIBODY, DOUBLE-STRANDED: ds DNA Ab: 3 IU/mL

## 2018-10-13 LAB — HIV ANTIBODY (ROUTINE TESTING W REFLEX): HIV 1&2 Ab, 4th Generation: NONREACTIVE

## 2018-10-13 LAB — TSH: TSH: 0.89 mIU/L

## 2018-10-13 MED ORDER — PREDNISONE 5 MG PO TABS: ORAL_TABLET | ORAL | 0 refills | Status: DC

## 2018-10-13 MED ORDER — HYDROXYCHLOROQUINE SULFATE 200 MG PO TABS: ORAL_TABLET | ORAL | 1 refills | Status: DC

## 2018-10-13 MED ORDER — TRIAMCINOLONE ACETONIDE 40 MG/ML IJ SUSP
40.0000 mg | INTRAMUSCULAR | Status: AC | PRN
  Administered 2018-10-13: 40 mg via INTRA_ARTICULAR

## 2018-10-13 MED ORDER — LIDOCAINE HCL 1 % IJ SOLN
1.0000 mL | INTRAMUSCULAR | Status: AC | PRN
  Administered 2018-10-13: 1 mL

## 2018-10-13 NOTE — Patient Instructions (Addendum)
Hydroxychloroquine tablets What is this medicine? HYDROXYCHLOROQUINE (hye drox ee KLOR oh kwin) is used to treat rheumatoid arthritis and systemic lupus erythematosus. It is also used to treat malaria. This medicine may be used for other purposes; ask your health care provider or pharmacist if you have questions. COMMON BRAND NAME(S): Plaquenil, Quineprox What should I tell my health care provider before I take this medicine? They need to know if you have any of these conditions: -diabetes -eye disease, vision problems -G6PD deficiency -history of blood diseases -history of irregular heartbeat -if you often drink alcohol -kidney disease -liver disease -porphyria -psoriasis -seizures -an unusual or allergic reaction to chloroquine, hydroxychloroquine, other medicines, foods, dyes, or preservatives -pregnant or trying to get pregnant -breast-feeding How should I use this medicine? Take this medicine by mouth with a glass of water. Follow the directions on the prescription label. Avoid taking antacids within 4 hours of taking this medicine. It is best to separate these medicines by at least 4 hours. Do not cut, crush or chew this medicine. You can take it with or without food. If it upsets your stomach, take it with food. Take your medicine at regular intervals. Do not take your medicine more often than directed. Take all of your medicine as directed even if you think you are better. Do not skip doses or stop your medicine early. Talk to your pediatrician regarding the use of this medicine in children. While this drug may be prescribed for selected conditions, precautions do apply. Overdosage: If you think you have taken too much of this medicine contact a poison control center or emergency room at once. NOTE: This medicine is only for you. Do not share this medicine with others. What if I miss a dose? If you miss a dose, take it as soon as you can. If it is almost time for your next dose,  take only that dose. Do not take double or extra doses. What may interact with this medicine? Do not take this medicine with any of the following medications: -cisapride -dofetilide -dronedarone -live virus vaccines -penicillamine -pimozide -thioridazine -ziprasidone This medicine may also interact with the following medications: -ampicillin -antacids -cimetidine -cyclosporine -digoxin -medicines for diabetes, like insulin, glipizide, glyburide -medicines for seizures like carbamazepine, phenobarbital, phenytoin -mefloquine -methotrexate -other medicines that prolong the QT interval (cause an abnormal heart rhythm) -praziquantel This list may not describe all possible interactions. Give your health care provider a list of all the medicines, herbs, non-prescription drugs, or dietary supplements you use. Also tell them if you smoke, drink alcohol, or use illegal drugs. Some items may interact with your medicine. What should I watch for while using this medicine? Tell your doctor or healthcare professional if your symptoms do not start to get better or if they get worse. Avoid taking antacids within 4 hours of taking this medicine. It is best to separate these medicines by at least 4 hours. Tell your doctor or health care professional right away if you have any change in your eyesight. Your vision and blood may be tested before and during use of this medicine. This medicine can make you more sensitive to the sun. Keep out of the sun. If you cannot avoid being in the sun, wear protective clothing and use sunscreen. Do not use sun lamps or tanning beds/booths. What side effects may I notice from receiving this medicine? Side effects that you should report to your doctor or health care professional as soon as possible: -allergic reactions like skin rash,   itching or hives, swelling of the face, lips, or tongue -changes in vision -decreased hearing or ringing of the ears -redness,  blistering, peeling or loosening of the skin, including inside the mouth -seizures -sensitivity to light -signs and symptoms of a dangerous change in heartbeat or heart rhythm like chest pain; dizziness; fast or irregular heartbeat; palpitations; feeling faint or lightheaded, falls; breathing problems -signs and symptoms of liver injury like dark yellow or brown urine; general ill feeling or flu-like symptoms; light-colored stools; loss of appetite; nausea; right upper belly pain; unusually weak or tired; yellowing of the eyes or skin -signs and symptoms of low blood sugar such as feeling anxious; confusion; dizziness; increased hunger; unusually weak or tired; sweating; shakiness; cold; irritable; headache; blurred vision; fast heartbeat; loss of consciousness -uncontrollable head, mouth, neck, arm, or leg movements Side effects that usually do not require medical attention (report to your doctor or health care professional if they continue or are bothersome): -anxious -diarrhea -dizziness -hair loss -headache -irritable -loss of appetite -nausea, vomiting -stomach pain This list may not describe all possible side effects. Call your doctor for medical advice about side effects. You may report side effects to FDA at 1-800-FDA-1088. Where should I keep my medicine? Keep out of the reach of children. In children, this medicine can cause overdose with small doses. Store at room temperature between 15 and 30 degrees C (59 and 86 degrees F). Protect from moisture and light. Throw away any unused medicine after the expiration date. NOTE: This sheet is a summary. It may not cover all possible information. If you have questions about this medicine, talk to your doctor, pharmacist, or health care provider.  2018 Elsevier/Gold Standard (2016-07-04 14:16:15) Standing Labs We placed an order today for your standing lab work.    Please come back and get your standing labs in 1 month and then in 3  months.  We have open lab Monday through Friday from 8:30-11:30 AM and 1:30-4:00 PM  at the office of Dr. Bo Merino.   You may experience shorter wait times on Monday and Friday afternoons. The office is located at [address] [place], [place],  [address] No appointment is necessary.   Labs are drawn by [facility].  You may receive a bill from [facility] for your lab work. If you have any questions regarding directions or hours of operation,  please call [phone].   Just as a reminder please drink plenty of water prior to coming for your lab work. Thanks!  Please get following immunization: Flu vaccine Pneumococcal vaccine  Please get baseline eye examination by an ophthalmologist and then yearly eye examination is recommended.  Systemic Lupus Erythematosus, Adult Systemic lupus erythematosus is a long-term (chronic) disease that can affect many parts of the body. It can damage the skin, joints, blood vessels, brain, kidneys, lungs, heart, and other internal organs. It causes pain, irritation, and inflammation. Systemic lupus erythematosus is an autoimmune disease. With this type of disease, the body's defense system (immune system) mistakenly attacks normal tissues instead of attacking germs or abnormal growths. What are the causes? The cause of this condition is not known. What increases the risk? This condition is more likely to develop in:  Females.  People of Asian descent.  People of African-American descent.  People who have a family history of the condition.  What are the signs or symptoms? General symptoms include:  Joint pain and swelling (common).  Fever.  Fatigue.  Unusual weight loss or weight gain.  Skin rashes, especially over the nose and cheeks (butterfly rash) and after sun exposure.  Sores inside the mouth or nose.  Other symptoms depend on which parts of the body are affected. They can include:  Shortness of breath.  Chest  pain.  Frequent urination.  Blood in the urine.  Seizures.  Mental changes.  Hair loss.  Swollen and tender lymph nodes.  Swelling of the hands or feet.  Symptoms can come and go. A period of time when symptoms get worse or come back is called a flare. A period of time with no symptoms is called a remission. How is this diagnosed? This condition is diagnosed based on symptoms, a medical history, and a physical exam. You may also have tests, including:  Blood tests.  Urine tests.  A chest X-ray.  A skin or kidney biopsy. For this test, a sample of tissue is taken from the skin or kidney and studied under a microscope.  You may be referred to an autoimmune disease specialist (rheumatologist). How is this treated? There is no cure for this condition, but treatment can keep the disease in remission, help to control symptoms, and prevent damage to the heart, lungs, kidneys, and other organs. Treatment may involve taking a combination of medicines over time. Follow these instructions at home: Medicines  Take medicines only as directed by your health care provider.  Do not take any medicines that contain estrogen without first checking with your health care provider. Estrogen can trigger flares and may increase your risk for blood clots. Lifestyle  Eat a heart-healthy diet.  Stay active as directed by your health care provider.  Do not smoke. If you need help quitting, ask your health care provider.  Protect your skin from the sun by applying sunblock and wearing protective hats and clothing.  Learn as much as you can about your condition and have a good support system in place. Support may come from family, friends, or a lupus support group. General instructions  Keep all follow-up visits as directed by your health care provider. This is important.  Work closely with all of your health care providers to manage your condition.  Let your health care provider know right  away if you become pregnant or if you plan to become pregnant. Pregnancy in women with this condition is considered high risk. Contact a health care provider if:  You have a fever.  Your symptoms flare.  You develop new symptoms.  You develop swollen feet or hands.  You develop puffiness around your eyes.  Your medicines are not working.  You have bloody, foamy, or coffee-colored urine.  There are changes in your urination. For example, you urinate more often at night.  You think that you may be depressed or have anxiety. Get help right away if:  You have chest pain.  You have trouble breathing.  You have a seizure.  You suddenly get a very bad headache.  You suddenly develop facial or body weakness.  You cannot speak.  You cannot understand speech. This information is not intended to replace advice given to you by your health care provider. Make sure you discuss any questions you have with your health care provider. Document Released: 11/09/2002 Document Revised: 07/15/2016 Document Reviewed: 10/27/2014 Elsevier Interactive Patient Education  [employer].

## 2018-10-13 NOTE — Progress Notes (Signed)
Pharmacy Note  Patient presented to [facility] to see Dr. Estanislado Pandy for new patient follow-up.  Patient seen by the pharmacist for counseling on Plaquenil for autoimmune disease.  Patient was counseled on the purpose, proper use, and adverse effects of hydroxychloroquine including nausea/diarrhea, skin rash, headaches, and sun sensitivity.  Discussed importance of annual eye exams while on hydroxychloroquine to monitor to ocular toxicity and discussed importance of frequent laboratory monitoring.  Provided patient with eye exam form for baseline ophthalmologic exam.  Provided patient with educational materials on hydroxychloroquine and answered all questions.  Patient consented to hydroxychloroquine.  Will upload consent in the media tab.    Mariella Saa, PharmD, [facility] Rheumatology Clinical Pharmacist  10/13/2018 12:16 PM

## 2018-10-15 ENCOUNTER — Telehealth: Payer: Self-pay | Admitting: Radiology

## 2018-10-15 NOTE — Telephone Encounter (Signed)
Left message to call [id number] to reschedule appointment if patient wants to see Dr Hulan Fray and not the physician covering for her.

## 2018-10-16 ENCOUNTER — Ambulatory Visit: Payer: Medicaid Other | Admitting: Rheumatology

## 2018-10-17 ENCOUNTER — Telehealth: Payer: Self-pay | Admitting: *Deleted

## 2018-10-17 ENCOUNTER — Encounter: Payer: Self-pay | Admitting: Rheumatology

## 2018-10-17 DIAGNOSIS — K921 Melena: Secondary | ICD-10-CM

## 2018-10-17 NOTE — Telephone Encounter (Signed)
Patient was seen in office 10/13/18. Patient contacted the office requesting a referral to GI. Patient states she is still having blood in stool. Can we provide referral?

## 2018-10-17 NOTE — Telephone Encounter (Signed)
Yes you may refer patient to Dr. Collene Mares or a GI physician of her choice.

## 2018-10-17 NOTE — Telephone Encounter (Signed)
Referral placed and notified patient via my chart.

## 2018-10-20 ENCOUNTER — Other Ambulatory Visit (HOSPITAL_COMMUNITY)
Admission: RE | Admit: 2018-10-20 | Discharge: 2018-10-20 | Disposition: A | Discharge status: Home / Self Care | Payer: Medicaid Other | Source: Ambulatory Visit | Attending: Obstetrics & Gynecology | Admitting: Obstetrics & Gynecology

## 2018-10-20 ENCOUNTER — Ambulatory Visit: Payer: Medicaid Other | Admitting: Rheumatology

## 2018-10-20 ENCOUNTER — Ambulatory Visit (INDEPENDENT_AMBULATORY_CARE_PROVIDER_SITE_OTHER): Payer: Medicaid Other | Admitting: Nurse Practitioner

## 2018-10-20 ENCOUNTER — Encounter: Payer: Self-pay | Admitting: Nurse Practitioner

## 2018-10-20 VITALS — BP 113/80 | HR 74 | Ht 61.0 in | Wt 131.0 lb

## 2018-10-20 DIAGNOSIS — Z01419 Encounter for gynecological examination (general) (routine) without abnormal findings: Secondary | ICD-10-CM | POA: Insufficient documentation

## 2018-10-20 DIAGNOSIS — Z0000 Encounter for general adult medical examination without abnormal findings: Secondary | ICD-10-CM

## 2018-10-20 NOTE — Progress Notes (Signed)
pt recently diagnosed with Lupus, is also having bloody stool and she is being referred to GI. Pt states she has no sexual desire, and is noticing an odorous discharge.

## 2018-10-20 NOTE — Progress Notes (Signed)
GYNECOLOGY ANNUAL PREVENTATIVE CARE ENCOUNTER NOTE  Subjective:   Evelyn [name] is a 32 y.o. [phone] female here for a routine annual gynecologic exam.  Current complaints: vaginal discharge with odor.   Denies abnormal vaginal bleeding, discharge, pelvic pain, problems with intercourse or other gynecologic concerns.  Has recently been diagnosed with lupus and is being followed for that diagnosis.  Has joint pain as an ongoing symptom with lupus.   Gynecologic History No LMP recorded. (Menstrual status: IUD). Contraception: tubal ligation done with pelvic surgery in Jan. 2017 and has [place] IUD for increased vaginal bleeding. Last Pap: 11/2014. Results were: normal  Obstetric History OB History  Gravida Para Term Preterm AB Living  4 4 2 2  0 4  SAB TAB Ectopic Multiple Live Births  0 0 0 0 4    # Outcome Date GA Lbr Len/2nd Weight Sex Delivery Anes PTL Lv  4 Preterm 08/05/14 [redacted]w[redacted]d  1 lb 14.3 oz (0.86 kg) F CS-LTranv Spinal  LIV  3 Term 12/15/09 [redacted]w[redacted]d  7 lb 6 oz (3.345 kg)  Vag-Spont   LIV  2 Term 03/01/08 [redacted]w[redacted]d  7 lb 1 oz (3.204 kg)  Vag-Spont   LIV  1 Preterm 12/13/02 [redacted]w[redacted]d  6 lb 1 oz (2.75 kg)  Vag-Spont   LIV     Birth Comments: IOL for Pre-eclampsia    Past Medical History:  Diagnosis Date  . Allergy   . Depression    post partum  . GERD (gastroesophageal reflux disease)   . HELLP syndrome    2015  . HSV (herpes simplex virus) infection   . Systemic lupus erythematosus ([place])   . Vaginal delivery 2004, 2009, 2011    Past Surgical History:  Procedure Laterality Date  . CARPAL TUNNEL RELEASE Right 09/26/2018   Procedure: RIGHT CARPAL TUNNEL RELEASE;  Surgeon: Leandrew Koyanagi, MD;  Location: [facility];  Service: Orthopedics;  Laterality: Right;  . CARPAL TUNNEL RELEASE Left 2017  . CESAREAN SECTION N/A 08/05/2014   Procedure: CESAREAN SECTION;  Surgeon: Jerelyn Charles, MD;  Location: [facility];  Service: Obstetrics;  Laterality: N/A;  . HERNIA REPAIR   2011  . KNEE SURGERY Left 2000  . LAPAROSCOPIC BILATERAL SALPINGECTOMY N/A 12/13/2015   Procedure: LAPAROSCOPIC BILATERAL SALPINGECTOMY;  Surgeon: Emily Filbert, MD;  Location: [facility];  Service: Gynecology;  Laterality: N/A;  . PERINEOPLASTY N/A 02/20/2016   Procedure: PERINEOPLASTY;  Surgeon: Emily Filbert, MD;  Location: [facility];  Service: Gynecology;  Laterality: N/A;  . PUBOVAGINAL SLING N/A 12/13/2015   Procedure: Gaynelle Arabian;  Surgeon: Emily Filbert, MD;  Location: [facility];  Service: Gynecology;  Laterality: N/A;  . RECTOCELE REPAIR N/A 02/20/2016   Procedure: POSTERIOR REPAIR (RECTOCELE);  Surgeon: Emily Filbert, MD;  Location: [facility];  Service: Gynecology;  Laterality: N/A;    Current Outpatient Medications on File Prior to Visit  Medication Sig Dispense Refill  . hydroxychloroquine (PLAQUENIL) 200 MG tablet Take 1 tablet 200 mg BID Monday-Friday 40 tablet 1  . LEVONORGESTREL IU 1 each by Intrauterine route once.     . predniSONE (DELTASONE) 5 MG tablet Take 4 tabs po x 4 days, 3  tabs po x 4 days, 2  tabs po x 4 days, 1  tab po x 4 days 40 tablet 0  . valACYclovir (VALTREX) 1000 MG tablet Take 500 mg by mouth daily as needed (outbreak).      No current facility-administered medications on file prior  to visit.     No Known Allergies  Social History   Socioeconomic History  . Marital status: Married    Spouse name: Not on file  . Number of children: Not on file  . Years of education: Not on file  . Highest education level: Not on file  Occupational History  . Occupation: Not Employed  Social Needs  . Financial resource strain: Not on file  . Food insecurity:    Worry: Not on file    Inability: Not on file  . Transportation needs:    Medical: Not on file    Non-medical: Not on file  Tobacco Use  . Smoking status: Never Smoker  . Smokeless tobacco: Never Used  Substance and Sexual Activity  . Alcohol use: Not Currently    Comment: occ  . Drug use: No  . Sexual activity:  Yes    Partners: Male    Birth control/protection: Surgical, IUD  Lifestyle  . Physical activity:    Days per week: Not on file    Minutes per session: Not on file  . Stress: Not on file  Relationships  . Social connections:    Talks on phone: Not on file    Gets together: Not on file    Attends religious service: Not on file    Active member of club or organization: Not on file    Attends meetings of clubs or organizations: Not on file    Relationship status: Not on file  . Intimate partner violence:    Fear of current or ex partner: Not on file    Emotionally abused: Not on file    Physically abused: Not on file    Forced sexual activity: Not on file  Other Topics Concern  . Not on file  Social History Narrative   Married. Pt does not exercise.   She works part-time at [employer] level of education: 12th grade    Family History  Problem Relation Age of Onset  . Healthy Mother   . Healthy Sister   . Healthy Brother   . Asthma Son   . Healthy Son   . Healthy Son   . Healthy Son   . Healthy Daughter   . Heart disease Neg Hx   . Neurologic Disorder Neg Hx     The following portions of the patient's history were reviewed and updated as appropriate: allergies, current medications, past family history, past medical history, past social history, past surgical history and problem list.  Review of Systems Pertinent items noted in HPI and remainder of comprehensive ROS otherwise negative.   Objective:  BP 113/80   Pulse 74   Ht 5\' 1"  (1.549 m)   Wt 131 lb (59.4 kg)   BMI 24.75 kg/m  CONSTITUTIONAL: Well-developed, well-nourished female in no acute distress.  HENT:  Normocephalic, atraumatic, External right and left ear normal.  EYES: Conjunctivae and EOM are normal. Pupils are equal, round.  No scleral icterus.  NECK: Normal range of motion, supple, no masses.  Normal thyroid.  SKIN: Skin is warm and dry. No rash noted. Not diaphoretic. No erythema.  No pallor. NEUROLOGIC: Alert and oriented to person, place, and time. Normal reflexes, muscle tone coordination. No cranial nerve deficit noted. PSYCHIATRIC: Normal mood and affect. Normal behavior. Normal judgment and thought content. CARDIOVASCULAR: Normal heart rate noted, regular rhythm RESPIRATORY: Clear to auscultation bilaterally. Effort and breath sounds normal, no problems with respiration noted. BREASTS: Symmetric in size.  No masses, skin changes, nipple drainage, or lymphadenopathy. ABDOMEN: Soft, no distention noted.  No tenderness, rebound or guarding.  PELVIC: Normal appearing external genitalia; normal appearing vaginal mucosa and cervix.  No abnormal discharge noted - minimal discharge seen at introitus on labia minora, but no abnormal discharge seen deeper in vaginal..  Pap smear obtained.  Normal uterine size, no other palpable masses, no uterine or adnexal tenderness.  IUD strings not visualized by able to palpate on bimanual on right side of cervix. (Right side of cervix tilted inward and not visualized fully when pap was done.) MUSCULOSKELETAL: Normal range of motion. No tenderness.  No cyanosis, clubbing, or edema.    Assessment and Plan:  1. Encounter for annual routine gynecological examination Will continue to follow up with her doctor for lupus management. Will continue Liletta IUD for now although her MD for lupus may want it removed.  Did not remove today. Vaginal discharge seems normal but will notify client via MyChart if treament is indicated/  Will follow up results of pap smear and manage accordingly. Routine preventative health maintenance measures emphasized. Please refer to After Visit Summary for other counseling recommendations.    Earlie Server, RN, MSN, NP-BC Nurse Practitioner, [place] for [facility]

## 2018-10-23 LAB — CYTOLOGY - PAP
Bacterial vaginitis: NEGATIVE
Candida vaginitis: NEGATIVE
Chlamydia: NEGATIVE
Diagnosis: NEGATIVE
HPV (WINDOPATH): NOT DETECTED
NEISSERIA GONORRHEA: NEGATIVE

## 2018-10-24 ENCOUNTER — Encounter (INDEPENDENT_AMBULATORY_CARE_PROVIDER_SITE_OTHER): Payer: Self-pay | Admitting: Physician Assistant

## 2018-10-24 ENCOUNTER — Ambulatory Visit (INDEPENDENT_AMBULATORY_CARE_PROVIDER_SITE_OTHER): Payer: Medicaid Other | Admitting: Physician Assistant

## 2018-10-24 ENCOUNTER — Encounter: Payer: Self-pay | Admitting: Rheumatology

## 2018-10-24 VITALS — Ht 61.0 in | Wt 131.0 lb

## 2018-10-24 DIAGNOSIS — G5601 Carpal tunnel syndrome, right upper limb: Secondary | ICD-10-CM

## 2018-10-24 NOTE — Progress Notes (Signed)
Post-Op Visit Note   Patient: Evelyn [name]           Date of Birth: 04-Jun-1986           MRN: [id number] Visit Date: 10/24/2018 PCP: Nickola Major, MD   Assessment & Plan:  Chief Complaint:  Chief Complaint  Patient presents with  . Right Hand - Routine Post Op    09/26/18 right CTR   Visit Diagnoses:  1. Carpal tunnel syndrome, right upper limb     Plan: Patient is a pleasant 32 year old female who presents to our clinic today 4 weeks status post right carpal tunnel release, date of surgery 09/26/2018.  She has noticed some peeling to the incision with continued soreness to the hyperthenar eminence but nothing too concerning.  No fevers or chills.  Examination of the right wrist reveals a well-healed surgical incision without evidence of infection.  Full motor and sensory function.  At this point, she will advance with activity as tolerated.  She will follow-up with [place] as needed.  Follow-Up Instructions: Return if symptoms worsen or fail to improve.   Orders:  No orders of the defined types were placed in this encounter.  No orders of the defined types were placed in this encounter.   Imaging: No new imaging  PMFS History: Patient Active Problem List   Diagnosis Date Noted  . Autoimmune disease ([place]) 10/08/2018  . Carpal tunnel syndrome, right upper limb 09/26/2018  . Radicular pain in right arm 09/16/2018  . History of pregnancy induced hypertension 08/03/2014  . History of pre-eclampsia 08/03/2014  . Chronic back pain 01/14/2014  . Allergic rhinitis 01/14/2014   Past Medical History:  Diagnosis Date  . Allergy   . Depression    post partum  . GERD (gastroesophageal reflux disease)   . HELLP syndrome    2015  . HSV (herpes simplex virus) infection   . Systemic lupus erythematosus ([place])   . Vaginal delivery 2004, 2009, 2011    Family History  Problem Relation Age of Onset  . Healthy Mother   . Healthy Sister   . Healthy Brother   . Asthma  Son   . Healthy Son   . Healthy Son   . Healthy Son   . Healthy Daughter   . Heart disease Neg Hx   . Neurologic Disorder Neg Hx     Past Surgical History:  Procedure Laterality Date  . CARPAL TUNNEL RELEASE Right 09/26/2018   Procedure: RIGHT CARPAL TUNNEL RELEASE;  Surgeon: Leandrew Koyanagi, MD;  Location: [facility];  Service: Orthopedics;  Laterality: Right;  . CARPAL TUNNEL RELEASE Left 2017  . CESAREAN SECTION N/A 08/05/2014   Procedure: CESAREAN SECTION;  Surgeon: Jerelyn Charles, MD;  Location: [facility];  Service: Obstetrics;  Laterality: N/A;  . HERNIA REPAIR  2011  . KNEE SURGERY Left 2000  . LAPAROSCOPIC BILATERAL SALPINGECTOMY N/A 12/13/2015   Procedure: LAPAROSCOPIC BILATERAL SALPINGECTOMY;  Surgeon: Emily Filbert, MD;  Location: [facility];  Service: Gynecology;  Laterality: N/A;  . PERINEOPLASTY N/A 02/20/2016   Procedure: PERINEOPLASTY;  Surgeon: Emily Filbert, MD;  Location: [facility];  Service: Gynecology;  Laterality: N/A;  . PUBOVAGINAL SLING N/A 12/13/2015   Procedure: Gaynelle Arabian;  Surgeon: Emily Filbert, MD;  Location: [facility];  Service: Gynecology;  Laterality: N/A;  . RECTOCELE REPAIR N/A 02/20/2016   Procedure: POSTERIOR REPAIR (RECTOCELE);  Surgeon: Emily Filbert, MD;  Location: [facility];  Service: Gynecology;  Laterality: N/A;   Social History   Occupational History  . Occupation: Not Employed  Tobacco Use  . Smoking status: Never Smoker  . Smokeless tobacco: Never Used  Substance and Sexual Activity  . Alcohol use: Not Currently    Comment: occ  . Drug use: No  . Sexual activity: Yes    Partners: Male    Birth control/protection: Surgical, IUD

## 2018-10-27 ENCOUNTER — Telehealth: Payer: Self-pay | Admitting: Rheumatology

## 2018-10-27 NOTE — Telephone Encounter (Signed)
Any GI office.

## 2018-10-27 NOTE — Telephone Encounter (Signed)
Referred to Dr. Carlean Purl.

## 2018-10-27 NOTE — Telephone Encounter (Signed)
Who would you like to refer the patient to? Patient being referred for blood in stool.

## 2018-10-27 NOTE — Telephone Encounter (Signed)
Dr. Lorie Apley office called to let you know, Dr. Collene Mares will not be able to see this patient. They are not excepting any new Medicaid patients at this time, and the patient's medicaid card has another doctor listed.

## 2018-10-29 ENCOUNTER — Encounter: Payer: Self-pay | Admitting: Internal Medicine

## 2018-11-05 NOTE — Progress Notes (Signed)
Office Visit Note  Patient: Evelyn [name]             Date of Birth: March 25, 1986           MRN: [id number]             PCP: Nickola Major, MD Referring: Nickola Major, MD Visit Date: 11/19/2018 Occupation: @GUAROCC @  Subjective:  Joint pain.  Marland Kitchen  History of Present Illness: Lucille Gemini Bunte is a 32 y.o. female with history of systemic lupus.  Patient was started on Plaquenil on October 13, 2018 along with prednisone taper.  She  finished prednisone after 2 weeks.  She states overall she is feeling better but she continues to have some joint pain.  She also have a stiffness around her neck.  She has been feeling some tremors.  She feels that she has some blurred vision.  She was seen by ophthalmologist and is given her eyedrops.  He continues to have sores in her mouth and nose.  Continues to have facial rash and photosensitivity.  Noticed some improvement in her renal symptoms.  Improvement in her fatigue so far.  She has appointment coming up with the gastroenterologist on the 20th.  She states that she has been having some epigastric pain.  She recently fell on her right hand.  She was seen by Dr.Xu.  She has been having discomfort in her right wrist.  Activities of Daily Living:  Patient reports morning stiffness for 2 hours.   Patient Denies nocturnal pain.  Difficulty dressing/grooming: Denies Difficulty climbing stairs: Reports Difficulty getting out of chair: Reports Difficulty using hands for taps, buttons, cutlery, and/or writing: Reports  Review of Systems  Constitutional: Positive for fatigue. Negative for night sweats, weight gain and weight loss.  HENT: Positive for mouth sores and mouth dryness. Negative for trouble swallowing, trouble swallowing and nose dryness.   Eyes: Positive for visual disturbance. Negative for pain, redness and dryness.  Respiratory: Negative for cough, shortness of breath and difficulty breathing.   Cardiovascular: Negative for  chest pain, palpitations, hypertension, irregular heartbeat and swelling in legs/feet.  Gastrointestinal: Positive for blood in stool and nausea. Negative for constipation and diarrhea.  Endocrine: Negative for increased urination.  Genitourinary: Negative for vaginal dryness.  Musculoskeletal: Positive for arthralgias, joint pain and morning stiffness. Negative for joint swelling, myalgias, muscle weakness, muscle tenderness and myalgias.  Skin: Positive for color change, rash and hair loss. Negative for skin tightness, ulcers and sensitivity to sunlight.  Allergic/Immunologic: Negative for susceptible to infections.  Neurological: Positive for tremors and headaches. Negative for dizziness, memory loss, night sweats and weakness.  Hematological: Positive for swollen glands.  Psychiatric/Behavioral: Positive for sleep disturbance. Negative for depressed mood. The patient is not nervous/anxious.     PMFS History:  Patient Active Problem List   Diagnosis Date Noted  . Systemic lupus erythematosus ([place]) 11/19/2018  . Raynaud's disease without gangrene 11/19/2018  . High risk medication use 11/19/2018  . History of bilateral carpal tunnel release 11/19/2018  . Autoimmune disease ([place]) 10/08/2018  . Carpal tunnel syndrome, right upper limb 09/26/2018  . Radicular pain in right arm 09/16/2018  . History of pregnancy induced hypertension 08/03/2014  . History of pre-eclampsia 08/03/2014  . Chronic back pain 01/14/2014  . Allergic rhinitis 01/14/2014    Past Medical History:  Diagnosis Date  . Allergy   . Depression    post partum  . GERD (gastroesophageal reflux disease)   . HELLP syndrome  2015  . HSV (herpes simplex virus) infection   . Systemic lupus erythematosus ([place])   . Vaginal delivery 2004, 2009, 2011    Family History  Problem Relation Age of Onset  . Healthy Mother   . Healthy Sister   . Healthy Brother   . Asthma Son   . Healthy Son   . Healthy Son   . Healthy  Son   . Healthy Daughter   . Heart disease Neg Hx   . Neurologic Disorder Neg Hx    Past Surgical History:  Procedure Laterality Date  . CARPAL TUNNEL RELEASE Right 09/26/2018   Procedure: RIGHT CARPAL TUNNEL RELEASE;  Surgeon: Leandrew Koyanagi, MD;  Location: [facility];  Service: Orthopedics;  Laterality: Right;  . CARPAL TUNNEL RELEASE Left 2017  . CESAREAN SECTION N/A 08/05/2014   Procedure: CESAREAN SECTION;  Surgeon: Jerelyn Charles, MD;  Location: [facility];  Service: Obstetrics;  Laterality: N/A;  . HERNIA REPAIR  2011  . KNEE SURGERY Left 2000  . LAPAROSCOPIC BILATERAL SALPINGECTOMY N/A 12/13/2015   Procedure: LAPAROSCOPIC BILATERAL SALPINGECTOMY;  Surgeon: Emily Filbert, MD;  Location: [facility];  Service: Gynecology;  Laterality: N/A;  . PERINEOPLASTY N/A 02/20/2016   Procedure: PERINEOPLASTY;  Surgeon: Emily Filbert, MD;  Location: [facility];  Service: Gynecology;  Laterality: N/A;  . PUBOVAGINAL SLING N/A 12/13/2015   Procedure: Gaynelle Arabian;  Surgeon: Emily Filbert, MD;  Location: [facility];  Service: Gynecology;  Laterality: N/A;  . RECTOCELE REPAIR N/A 02/20/2016   Procedure: POSTERIOR REPAIR (RECTOCELE);  Surgeon: Emily Filbert, MD;  Location: [facility];  Service: Gynecology;  Laterality: N/A;   Social History   Social History Narrative   Married. Pt does not exercise.   She works part-time at [employer] level of education: 12th grade   Immunization History  Administered Date(s) Administered  . Influenza,inj,Quad PF,6+ Mos 08/11/2014, 09/07/2016, 11/04/2017, 09/24/2018  . Pneumococcal Conjugate-13 10/20/2018  . Tdap 08/06/2014   Objective: Vital Signs: BP 107/70 (BP Location: Left Arm, Patient Position: Sitting, Cuff Size: Normal)   Pulse 66   Resp 13   Ht 5\' 1"  (1.549 m)   Wt 135 lb 9.6 oz (61.5 kg)   BMI 25.62 kg/m    Physical Exam Vitals signs and nursing note reviewed.  Constitutional:      Appearance: She is well-developed.  HENT:     Head:  Normocephalic and atraumatic.  Eyes:     Conjunctiva/sclera: Conjunctivae normal.  Neck:     Musculoskeletal: Normal range of motion.  Cardiovascular:     Rate and Rhythm: Normal rate and regular rhythm.     Heart sounds: Normal heart sounds.  Pulmonary:     Effort: Pulmonary effort is normal.     Breath sounds: Normal breath sounds.  Abdominal:     General: Bowel sounds are normal.     Palpations: Abdomen is soft.  Lymphadenopathy:     Cervical: No cervical adenopathy.  Skin:    General: Skin is warm and dry.     Capillary Refill: Capillary refill takes less than 2 seconds.     Comments: Malar rash  Neurological:     Mental Status: She is alert and oriented to person, place, and time.  Psychiatric:        Behavior: Behavior normal.      Musculoskeletal Exam: C-spine thoracic lumbar spine good range of motion.  Shoulder joints elbow joints wrist joint MCPs PIPs DIPs  were in good range of motion with no synovitis.  Hip joints knee joints ankles MTPs PIPs were in good range of motion with no synovitis.  CDAI Exam: CDAI Score: Not documented Patient Global Assessment: Not documented; Provider Global Assessment: Not documented Swollen: 0 ; Tender: 0  Joint Exam   Not documented   There is currently no information documented on the homunculus. Go to the Rheumatology activity and complete the homunculus joint exam.  Investigation: No additional findings.  Imaging: Xr Hand Complete Right  Result Date: 11/18/2018 No acute or structural abnormalities.   Recent Labs: Lab Results  Component Value Date   WBC 5.0 10/06/2018   HGB 12.2 10/06/2018   PLT 318 10/06/2018   NA 139 10/06/2018   K 4.4 10/06/2018   CL 103 10/06/2018   CO2 22 10/06/2018   GLUCOSE 77 10/06/2018   BUN 11 10/06/2018   CREATININE 0.61 10/06/2018   BILITOT 0.4 10/06/2018   ALKPHOS 119 02/19/2016   AST 16 10/06/2018   ALT 9 10/06/2018   PROT 7.3 10/06/2018   PROT 7.5 10/06/2018   ALBUMIN 4.7  02/19/2016   CALCIUM 9.5 10/06/2018   GFRAA 140 10/06/2018   QFTBGOLDPLUS NEGATIVE 10/06/2018    Speciality Comments: No specialty comments available.  Procedures:  No procedures performed Allergies: Patient has no known allergies.   Assessment / Plan:     Visit Diagnoses: Other systemic lupus erythematosus with other organ involvement (HCC) - Positive ANA, positive SSA, history of oral ulcers, nasal ulcers, lymphadenopathy, fatigue, weight loss, facial rash, photosensitivity, Raynauds phenomenon -patient continues to have fatigue.  She states she does not feel good and continues to have arthralgias.  I did not see any synovitis on examination.  The lymphadenopathy has resolved.  She had no oral or nasal ulcers today on examination.  She continues to have some facial redness from malar rash.  There was no obvious Raynauds on examination.  Plan: Sedimentation rate  Raynaud's disease without gangrene -warm clothing was suggested.  High risk medication use - PLQ 200 mg p.o. twice daily Monday through Friday. eye exam per patient was normal.  I do not have the report available.- Plan: CBC with Differential/Platelet, COMPLETE METABOLIC PANEL WITH GFR  Patient has had flu shot this year and Prevnar 13.  Recommend Pneumovax 23  Blood in stool -patient is scheduled with Dr. Carlean Purl on 11/21/2018.  She is also complaining of some nausea.  Have advised her to take Plaquenil with meals and also discuss this further with her GI physician.  History of bilateral carpal tunnel release-she is gradually recovering from it.  Patient states she had some recent fall and hurting in her right hand for which she is seeing Dr.Xu.  Chronic midline low back pain without sciatica -lumbar spine exercises were discussed.  Orders: Orders Placed This Encounter  Procedures  . CBC with Differential/Platelet  . COMPLETE METABOLIC PANEL WITH GFR  . Sedimentation rate   No orders of the defined types were placed in this  encounter.   Face-to-face time spent with patient was 30 minutes. Greater than 50% of time was spent in counseling and coordination of care.  Follow-Up Instructions: Return in about 4 weeks (around 12/17/2018) for SLE.   Bo Merino, MD  Note - This record has been created using Editor, commissioning.  Chart creation errors have been sought, but may not always  have been located. Such creation errors do not reflect on  the standard of medical care.

## 2018-11-07 ENCOUNTER — Encounter: Payer: Self-pay | Admitting: Family Medicine

## 2018-11-14 ENCOUNTER — Ambulatory Visit: Payer: Medicaid Other | Admitting: Rheumatology

## 2018-11-18 ENCOUNTER — Encounter (INDEPENDENT_AMBULATORY_CARE_PROVIDER_SITE_OTHER): Payer: Self-pay | Admitting: Orthopaedic Surgery

## 2018-11-18 ENCOUNTER — Ambulatory Visit (INDEPENDENT_AMBULATORY_CARE_PROVIDER_SITE_OTHER): Payer: Medicaid Other

## 2018-11-18 ENCOUNTER — Ambulatory Visit (INDEPENDENT_AMBULATORY_CARE_PROVIDER_SITE_OTHER): Payer: Medicaid Other | Admitting: Orthopaedic Surgery

## 2018-11-18 DIAGNOSIS — M79641 Pain in right hand: Secondary | ICD-10-CM | POA: Diagnosis not present

## 2018-11-18 NOTE — Progress Notes (Signed)
Office Visit Note   Patient: Evelyn [name]           Date of Birth: May 18, 1986           MRN: [id number] Visit Date: 11/18/2018              Requested by: Nickola Major, MD [id number] [place], [place] [address] PCP: Nickola Major, MD   Assessment & Plan: Visit Diagnoses:  1. Pain of right hand     Plan: Impression is right hand contusion.  X-rays do not show an acute injury to the bony structure.  She understands that hook of hamate fracture can sometimes be hard to diagnose on x-ray.  Therefore she continues to have pain symptoms 4 to 6 weeks from now she should follow-up for reevaluation.  She was recently diagnosed with lupus by Dr. Estanislado Pandy.  Follow-Up Instructions: Return if symptoms worsen or fail to improve.   Orders:  Orders Placed This Encounter  Procedures  . XR Hand Complete Right   No orders of the defined types were placed in this encounter.     Procedures: No procedures performed   Clinical Data: No additional findings.   Subjective: Chief Complaint  Patient presents with  . Right Hand - Pain, Follow-up    [name] comes in for acute pain to her right hand after mechanical fall during the she fell directly on outstretched hand and now she is complaining of mainly pain in the base of the palm more so in the hyperthenar region.  She denies any numbness and tingling or any recurrent carpal tunnel symptoms denies any swelling or bruising.   Review of Systems  Constitutional: Negative.   HENT: Negative.   Eyes: Negative.   Respiratory: Negative.   Cardiovascular: Negative.   Endocrine: Negative.   Musculoskeletal: Negative.   Neurological: Negative.   Hematological: Negative.   Psychiatric/Behavioral: Negative.   All other systems reviewed and are negative.    Objective: Vital Signs: There were no vitals taken for this visit.  Physical Exam Vitals signs and nursing note reviewed.  Constitutional:      Appearance: She is  well-developed.  Pulmonary:     Effort: Pulmonary effort is normal.  Skin:    General: Skin is warm.     Capillary Refill: Capillary refill takes less than 2 seconds.  Neurological:     Mental Status: She is alert and oriented to person, place, and time.  Psychiatric:        Behavior: Behavior normal.        Thought Content: Thought content normal.        Judgment: Judgment normal.     Ortho Exam Right hand exam shows a fully healed surgical scar.  She is not overly tender over the pisiform or the hook of the hamate.  She is more tender on the ulnar aspect of the hyperthenar eminence.  She is slightly tender over the trapezial tubercle.  No neurovascular compromise. Specialty Comments:  No specialty comments available.  Imaging: Xr Hand Complete Right  Result Date: 11/18/2018 No acute or structural abnormalities.    PMFS History: Patient Active Problem List   Diagnosis Date Noted  . Autoimmune disease ([place]) 10/08/2018  . Carpal tunnel syndrome, right upper limb 09/26/2018  . Radicular pain in right arm 09/16/2018  . History of pregnancy induced hypertension 08/03/2014  . History of pre-eclampsia 08/03/2014  . Chronic back pain 01/14/2014  . Allergic rhinitis 01/14/2014   Past  Medical History:  Diagnosis Date  . Allergy   . Depression    post partum  . GERD (gastroesophageal reflux disease)   . HELLP syndrome    2015  . HSV (herpes simplex virus) infection   . Systemic lupus erythematosus ([place])   . Vaginal delivery 2004, 2009, 2011    Family History  Problem Relation Age of Onset  . Healthy Mother   . Healthy Sister   . Healthy Brother   . Asthma Son   . Healthy Son   . Healthy Son   . Healthy Son   . Healthy Daughter   . Heart disease Neg Hx   . Neurologic Disorder Neg Hx     Past Surgical History:  Procedure Laterality Date  . CARPAL TUNNEL RELEASE Right 09/26/2018   Procedure: RIGHT CARPAL TUNNEL RELEASE;  Surgeon: Leandrew Koyanagi, MD;  Location:  [facility];  Service: Orthopedics;  Laterality: Right;  . CARPAL TUNNEL RELEASE Left 2017  . CESAREAN SECTION N/A 08/05/2014   Procedure: CESAREAN SECTION;  Surgeon: Jerelyn Charles, MD;  Location: [facility];  Service: Obstetrics;  Laterality: N/A;  . HERNIA REPAIR  2011  . KNEE SURGERY Left 2000  . LAPAROSCOPIC BILATERAL SALPINGECTOMY N/A 12/13/2015   Procedure: LAPAROSCOPIC BILATERAL SALPINGECTOMY;  Surgeon: Emily Filbert, MD;  Location: [facility];  Service: Gynecology;  Laterality: N/A;  . PERINEOPLASTY N/A 02/20/2016   Procedure: PERINEOPLASTY;  Surgeon: Emily Filbert, MD;  Location: [facility];  Service: Gynecology;  Laterality: N/A;  . PUBOVAGINAL SLING N/A 12/13/2015   Procedure: Gaynelle Arabian;  Surgeon: Emily Filbert, MD;  Location: [facility];  Service: Gynecology;  Laterality: N/A;  . RECTOCELE REPAIR N/A 02/20/2016   Procedure: POSTERIOR REPAIR (RECTOCELE);  Surgeon: Emily Filbert, MD;  Location: [facility];  Service: Gynecology;  Laterality: N/A;   Social History   Occupational History  . Occupation: Not Employed  Tobacco Use  . Smoking status: Never Smoker  . Smokeless tobacco: Never Used  Substance and Sexual Activity  . Alcohol use: Not Currently    Comment: occ  . Drug use: No  . Sexual activity: Yes    Partners: Male    Birth control/protection: Surgical, I.U.D.

## 2018-11-19 ENCOUNTER — Ambulatory Visit: Payer: Medicaid Other | Admitting: Rheumatology

## 2018-11-19 ENCOUNTER — Encounter: Payer: Self-pay | Admitting: Rheumatology

## 2018-11-19 VITALS — BP 107/70 | HR 66 | Resp 13 | Ht 61.0 in | Wt 135.6 lb

## 2018-11-19 DIAGNOSIS — G8929 Other chronic pain: Secondary | ICD-10-CM

## 2018-11-19 DIAGNOSIS — K921 Melena: Secondary | ICD-10-CM

## 2018-11-19 DIAGNOSIS — I7300 Raynaud's syndrome without gangrene: Secondary | ICD-10-CM

## 2018-11-19 DIAGNOSIS — Z79899 Other long term (current) drug therapy: Secondary | ICD-10-CM

## 2018-11-19 DIAGNOSIS — M329 Systemic lupus erythematosus, unspecified: Secondary | ICD-10-CM | POA: Insufficient documentation

## 2018-11-19 DIAGNOSIS — M545 Low back pain: Secondary | ICD-10-CM

## 2018-11-19 DIAGNOSIS — M3219 Other organ or system involvement in systemic lupus erythematosus: Secondary | ICD-10-CM | POA: Diagnosis not present

## 2018-11-19 DIAGNOSIS — Z9889 Other specified postprocedural states: Secondary | ICD-10-CM | POA: Insufficient documentation

## 2018-11-20 ENCOUNTER — Telehealth: Payer: Self-pay | Admitting: Rheumatology

## 2018-11-20 LAB — SEDIMENTATION RATE: SED RATE: 14 mm/h (ref 0–20)

## 2018-11-20 NOTE — Telephone Encounter (Signed)
Patient asked with her sed rate being normal what does that mean. Patient advised that means that there is no inflammation detected at this time. Patient asked if her Lupus was under control at this time. Patient advised we did not recheck all of her Lupus labs at this time. Patient verbalized understanding.

## 2018-11-20 NOTE — Progress Notes (Signed)
ESR normal

## 2018-11-20 NOTE — Telephone Encounter (Signed)
Patient calling you back in reference to your call to her earlier. Patient has a question. Please call to advise.

## 2018-11-21 ENCOUNTER — Ambulatory Visit: Payer: Medicaid Other | Admitting: Internal Medicine

## 2018-11-21 ENCOUNTER — Encounter: Payer: Self-pay | Admitting: Internal Medicine

## 2018-11-21 VITALS — BP 90/60 | HR 84 | Ht 61.75 in | Wt 137.1 lb

## 2018-11-21 DIAGNOSIS — K5909 Other constipation: Secondary | ICD-10-CM

## 2018-11-21 DIAGNOSIS — R1013 Epigastric pain: Secondary | ICD-10-CM | POA: Diagnosis not present

## 2018-11-21 DIAGNOSIS — K649 Unspecified hemorrhoids: Secondary | ICD-10-CM

## 2018-11-21 DIAGNOSIS — R1310 Dysphagia, unspecified: Secondary | ICD-10-CM

## 2018-11-21 DIAGNOSIS — K219 Gastro-esophageal reflux disease without esophagitis: Secondary | ICD-10-CM | POA: Diagnosis not present

## 2018-11-21 MED ORDER — OMEPRAZOLE 40 MG PO CPDR: 40.0000 mg | DELAYED_RELEASE_CAPSULE | Freq: Every day | ORAL | 0 refills | Status: DC

## 2018-11-21 NOTE — Progress Notes (Signed)
Evelyn [name] 32 y.o. November 14, 1986 [id number] Referred by: Bo Merino, MD  Assessment & Plan:   Encounter Diagnoses  Name Primary?  . Bleeding hemorrhoids Yes  . Chronic constipation   . Gastroesophageal reflux disease, esophagitis presence not specified   . Abdominal pain, epigastric   . Dysphagia, with liquids    Treatment plan as follows:  benefiber 1-2 tbsp/day to help constipation and hopefully reduce bleeding from hemorrhoids May add steroid cream or suppositories if needed, banding is an option but need to improve defecation patterns first and that may work alone Hi fiber diet (she says gets fiber and liquids) Ba swallow w/ tablet to evaluate reflux and dysphagia Omeprazole 40 mg p.o. daily to treat reflux and dyspeptic symptoms. If she has persistent issues EGD could be needed.  F/U TBA pending the studies   I appreciate the opportunity to care for this patient.  Copy to Dr. Bo Merino Subjective:   Chief Complaint: Rectal bleeding nausea constipation bloating  HPI 32-year-old married Hispanic woman with a new diagnosis of lupus in the last few months, with complaints of rectal bleeding and abdominal discomfort.  Rectal bleeding has been ongoing minor on the toilet paper but now having some passage of blood into the commode and dripping at times.  No prolapse.  She has a known history of hemorrhoids.  She is constipated with bowel movements once or twice a week.  Strains to stool at times.  Not describe significant abdominal pain but feels bloated and full.  She did have a pubovaginal sling repair a couple of years ago and said that has helped with urinary symptoms but she is having urinary frequency again.  She has had 4 children.   Also having bloating and gurgling and discomfort in the epigastrium and nausea at times.  Some heartburn.  Many years ago she took a PPI and cannot remember if it helped.  She is having epigastric aching and sometimes  intermittent pains and intermittent nausea out of the blue.  She denies any anxiety that might be attributed to  nausea.  She may be having some liquid dysphagia, asking if food is trying to go down the wrong way at times she thinks.  No solid food difficulties.  Plaquenil is a new medication.  The symptoms may be a bit worse since then but they were present before as well.  She says her rheumatologic terms are similar but Dr. Cyndie Chime far tells her her inflammatory markers are less or lower sedimentation rate went from 25-14.  Wt Readings from Last 3 Encounters:  11/21/18 137 lb 2 oz (62.2 kg)  11/19/18 135 lb 9.6 oz (61.5 kg)  10/24/18 131 lb (59.4 kg)    No Known Allergies Current Meds  Medication Sig  . hydroxychloroquine (PLAQUENIL) 200 MG tablet Take 1 tablet 200 mg BID Monday-Friday  . LEVONORGESTREL IU 1 each by Intrauterine route once.   . valACYclovir (VALTREX) 1000 MG tablet Take 500 mg by mouth as needed (outbreak).    Past Medical History:  Diagnosis Date  . Allergy   . Depression    post partum  . GERD (gastroesophageal reflux disease)   . HELLP syndrome    2015  . HSV (herpes simplex virus) infection   . Systemic lupus erythematosus ([place])   . Umbilical hernia   . Vaginal delivery 2004, 2009, 2011   Past Surgical History:  Procedure Laterality Date  . CARPAL TUNNEL RELEASE Right 09/26/2018   Procedure: RIGHT CARPAL TUNNEL RELEASE;  Surgeon: Leandrew Koyanagi, MD;  Location: [facility];  Service: Orthopedics;  Laterality: Right;  . CARPAL TUNNEL RELEASE Left 2017  . CESAREAN SECTION N/A 08/05/2014   Procedure: CESAREAN SECTION;  Surgeon: Jerelyn Charles, MD;  Location: [facility];  Service: Obstetrics;  Laterality: N/A;  . KNEE SURGERY Left 2000  . LAPAROSCOPIC BILATERAL SALPINGECTOMY N/A 12/13/2015   Procedure: LAPAROSCOPIC BILATERAL SALPINGECTOMY;  Surgeon: Emily Filbert, MD;  Location: [facility];  Service: Gynecology;  Laterality: N/A;  . PERINEOPLASTY N/A 02/20/2016     Procedure: PERINEOPLASTY;  Surgeon: Emily Filbert, MD;  Location: [facility];  Service: Gynecology;  Laterality: N/A;  . PUBOVAGINAL SLING N/A 12/13/2015   Procedure: Gaynelle Arabian;  Surgeon: Emily Filbert, MD;  Location: [facility];  Service: Gynecology;  Laterality: N/A;  . RECTOCELE REPAIR N/A 02/20/2016   Procedure: POSTERIOR REPAIR (RECTOCELE);  Surgeon: Emily Filbert, MD;  Location: [facility];  Service: Gynecology;  Laterality: N/A;  . UMBILICAL HERNIA REPAIR  2011   Social History   Social History Narrative   Originally from [place], graduated [employer]   Married.  3 sons one daughter, birth years 2004, 2009, 2011, 2015    Pt does not exercise.   She works part-time at [employer] when she can   Highest level of education: 12th grade   No alcohol tobacco or drug use   1 coffee daily   family history includes Asthma in her son; Healthy in her brother, daughter, mother, sister, son, son, and son.   Review of Systems As per HPI having some urinary urgency again insomnia headaches myalgias night sweats symptoms appear negative  Objective:   Physical Exam @BP  90/60 (BP Location: Left Arm, Patient Position: Sitting, Cuff Size: Normal)   Pulse 84   Ht 5' 1.75" (1.568 m) Comment: height measured without shoes  Wt 137 lb 2 oz (62.2 kg)   BMI 25.28 kg/m @  General:  Well-developed, well-nourished and in no acute distress Eyes:  anicteric. ENT:   Mouth and posterior pharynx free of lesions.  Neck:   supple w/o thyromegaly or mass.  Lungs: Clear to auscultation bilaterally. Heart:  S1S2, no rubs, murmurs, gallops. Abdomen:  soft, non-tender, no hepatosplenomegaly, hernia, or mass and BS+.  Rectal:  Patti [place], [place] present.   Anoderm inspection revealed sl tag right Anal wink was + Digital exam revealed normal resting tone and voluntary squeeze. Firm brown stool filled the vault No mass or rectocele present. Simulated defecation with valsalva revealed appropriate  abdominal contraction and descent.   Anoscopy:  Inflamed Gr1-2 int/ext hemorrhoids  Lymph:  no cervical or supraclavicular adenopathy. Extremities:   no edema, cyanosis or clubbing Skin   mild malar rash - multiple tattoos. Neuro:  A&O x 3.  Psych:  appropriate mood and  Affect.   Data Reviewed: See HPI I have seen recent rheumatology notes and labs in the computer

## 2018-11-21 NOTE — Patient Instructions (Signed)
  Please take Benefiber daily, start with 1 tablespoon and then increase to 2 tablespoons daily. Handout provided.   We have sent the following medications to your pharmacy for you to pick up at your convenience: Omeprazole  We are giving you a handout for a high fiber diet to read and follow.   You have been scheduled for a Barium Esophogram at  [facility] on 08/28/18 at 9:30AM. Please arrive 20 minutes prior to your appointment for registration. You may eat but do so lightly.. If you need to reschedule for any reason, please contact radiology at [phone] to do so. __________________________________________________________________ A barium swallow is an examination that concentrates on views of the esophagus. This tends to be a double contrast exam (barium and two liquids which, when combined, create a gas to distend the wall of the oesophagus) or single contrast (non-ionic iodine based). The study is usually tailored to your symptoms so a good history is essential. Attention is paid during the study to the form, structure and configuration of the esophagus, looking for functional disorders (such as aspiration, dysphagia, achalasia, motility and reflux) EXAMINATION You may be asked to change into a gown, depending on the type of swallow being performed. A radiologist and radiographer will perform the procedure. The radiologist will advise you of the type of contrast selected for your procedure and direct you during the exam. You will be asked to stand, sit or lie in several different positions and to hold a small amount of fluid in your mouth before being asked to swallow while the imaging is performed .In some instances you may be asked to swallow barium coated marshmallows to assess the motility of a solid food bolus. The exam can be recorded as a digital or video fluoroscopy procedure. POST PROCEDURE It will take 1-2 days for the barium to pass through your system. To facilitate this,  it is important, unless otherwise directed, to increase your fluids for the next 24-48hrs and to resume your normal diet.  This test typically takes about 30 minutes to perform. __________________________________________________________________________________ I appreciate the opportunity to care for you. Silvano Rusk, MD, [facility]

## 2018-11-27 ENCOUNTER — Ambulatory Visit
Admission: RE | Admit: 2018-11-27 | Discharge: 2018-11-27 | Disposition: A | Discharge status: Home / Self Care | Payer: Medicaid Other | Source: Ambulatory Visit | Attending: Internal Medicine | Admitting: Internal Medicine

## 2018-11-27 DIAGNOSIS — R1310 Dysphagia, unspecified: Secondary | ICD-10-CM

## 2018-12-01 NOTE — Progress Notes (Signed)
Ba swallow shows reflux I started PPI and want her to stay on that See me late Feb (bout 2 mos)

## 2018-12-04 ENCOUNTER — Other Ambulatory Visit: Payer: Self-pay | Admitting: Rheumatology

## 2018-12-04 NOTE — Progress Notes (Signed)
Office Visit Note  Patient: Evelyn [name]             Date of Birth: 1986-12-02           MRN: [id number]             PCP: Nickola Major, MD Referring: Nickola Major, MD Visit Date: 12/17/2018 Occupation: @GUAROCC @  Subjective:  Rash and joint pain    History of Present Illness: Evelyn [name] is a 33 y.o. female with history of systemic lupus erythematosus and Raynaud's.  Patient is taking plaquenil 200 mg twice daily Monday through Friday. She was started on PLQ in November 2019, and she noticed significant benefit initially. She had in flu at the beginning of January.  She was treated with tamiflu. She denies missing any doses of PLQ recently. She has started to have an increase in symptoms since last Thursday.  She is having increased pain and stiffness in her joints.  She is having pain on the dorsal aspect of the right foot and bilateral heels.  She has noticed increased trapezius muscle tension and muscle tenderness. She is having worsening fatigue.  She has also noticed the facial rash return and has noticed blisters on her scalp. She has been wearing sunscreen on a daily basis. She has not had any recent symptoms of Raynaud's. She is having sores in mouth and nose. She is having worsening sicca symptoms.  She has started having the chills, worsening fatigue, and night sweats.  She denies any shortness of breath or palpitations.    Activities of Daily Living:  Patient reports morning stiffness for several hours.   Patient Reports nocturnal pain.  Difficulty dressing/grooming: Denies Difficulty climbing stairs: Reports Difficulty getting out of chair: Denies Difficulty using hands for taps, buttons, cutlery, and/or writing: Reports  Review of Systems  Constitutional: Positive for fatigue.  HENT: Positive for mouth sores and mouth dryness. Negative for trouble swallowing, trouble swallowing and nose dryness.        Nose sores   Eyes: Positive for dryness.  Negative for pain, redness and visual disturbance.  Respiratory: Negative for cough, hemoptysis, shortness of breath, wheezing and difficulty breathing.   Cardiovascular: Negative for chest pain, palpitations, hypertension, irregular heartbeat and swelling in legs/feet.  Gastrointestinal: Negative for blood in stool, constipation and diarrhea.  Endocrine: Negative for increased urination.  Genitourinary: Negative for painful urination, nocturia and pelvic pain.  Musculoskeletal: Positive for arthralgias, joint pain and morning stiffness. Negative for joint swelling, myalgias, muscle weakness, muscle tenderness and myalgias.  Skin: Positive for rash and hair loss. Negative for color change, pallor, nodules/bumps, skin tightness, ulcers and sensitivity to sunlight.  Allergic/Immunologic: Negative for susceptible to infections.  Neurological: Negative for dizziness, light-headedness, numbness and headaches.  Hematological: Negative for swollen glands.  Psychiatric/Behavioral: Negative for depressed mood, confusion and sleep disturbance. The patient is not nervous/anxious.     PMFS History:  Patient Active Problem List   Diagnosis Date Noted  . Systemic lupus erythematosus ([place]) 11/19/2018  . Raynaud's disease without gangrene 11/19/2018  . High risk medication use 11/19/2018  . History of bilateral carpal tunnel release 11/19/2018  . Autoimmune disease ([place]) 10/08/2018  . Carpal tunnel syndrome, right upper limb 09/26/2018  . Radicular pain in right arm 09/16/2018  . History of pregnancy induced hypertension 08/03/2014  . History of pre-eclampsia 08/03/2014  . Chronic back pain 01/14/2014  . Allergic rhinitis 01/14/2014    Past Medical History:  Diagnosis Date  .  Allergy   . Depression    post partum  . GERD (gastroesophageal reflux disease)   . HELLP syndrome    2015  . HSV (herpes simplex virus) infection   . Systemic lupus erythematosus ([place])   . Umbilical hernia   . Vaginal  delivery 2004, 2009, 2011    Family History  Problem Relation Age of Onset  . Healthy Mother   . Healthy Sister   . Healthy Brother   . Asthma Son   . Healthy Son   . Healthy Son   . Healthy Son   . Healthy Daughter   . Heart disease Neg Hx   . Neurologic Disorder Neg Hx    Past Surgical History:  Procedure Laterality Date  . CARPAL TUNNEL RELEASE Right 09/26/2018   Procedure: RIGHT CARPAL TUNNEL RELEASE;  Surgeon: Leandrew Koyanagi, MD;  Location: [facility];  Service: Orthopedics;  Laterality: Right;  . CARPAL TUNNEL RELEASE Left 2017  . CESAREAN SECTION N/A 08/05/2014   Procedure: CESAREAN SECTION;  Surgeon: Jerelyn Charles, MD;  Location: [facility];  Service: Obstetrics;  Laterality: N/A;  . KNEE SURGERY Left 2000  . LAPAROSCOPIC BILATERAL SALPINGECTOMY N/A 12/13/2015   Procedure: LAPAROSCOPIC BILATERAL SALPINGECTOMY;  Surgeon: Emily Filbert, MD;  Location: [facility];  Service: Gynecology;  Laterality: N/A;  . PERINEOPLASTY N/A 02/20/2016   Procedure: PERINEOPLASTY;  Surgeon: Emily Filbert, MD;  Location: [facility];  Service: Gynecology;  Laterality: N/A;  . PUBOVAGINAL SLING N/A 12/13/2015   Procedure: Gaynelle Arabian;  Surgeon: Emily Filbert, MD;  Location: [facility];  Service: Gynecology;  Laterality: N/A;  . RECTOCELE REPAIR N/A 02/20/2016   Procedure: POSTERIOR REPAIR (RECTOCELE);  Surgeon: Emily Filbert, MD;  Location: [facility];  Service: Gynecology;  Laterality: N/A;  . UMBILICAL HERNIA REPAIR  2011   Social History   Social History Narrative   Originally from [place], graduated [employer]   Married.  3 sons one daughter, birth years 2004, 2009, 2011, 2015    Pt does not exercise.   She works part-time at [employer] when she can   Highest level of education: 12th grade   No alcohol tobacco or drug use   1 coffee daily    Objective: Vital Signs: BP 108/76 (BP Location: Left Arm, Patient Position: Sitting, Cuff Size: Normal)   Pulse 74   Resp 12   Ht 5\' 1"   (1.549 m)   Wt 137 lb (62.1 kg)   BMI 25.89 kg/m    Physical Exam Vitals signs and nursing note reviewed.  Constitutional:      Appearance: She is well-developed.  HENT:     Head: Normocephalic and atraumatic.     Comments: No parotid swelling or tenderness.  Eyes:     Conjunctiva/sclera: Conjunctivae normal.  Neck:     Musculoskeletal: Normal range of motion.  Cardiovascular:     Rate and Rhythm: Normal rate and regular rhythm.     Heart sounds: Normal heart sounds.  Pulmonary:     Effort: Pulmonary effort is normal.     Breath sounds: Normal breath sounds.  Abdominal:     General: Bowel sounds are normal.     Palpations: Abdomen is soft.  Lymphadenopathy:     Cervical: No cervical adenopathy.  Skin:    General: Skin is warm and dry.     Capillary Refill: Capillary refill takes less than 2 seconds.     Comments: Malar rash noted.  No  digital ulcerations or signs of gangrene.   Neurological:     Mental Status: She is alert and oriented to person, place, and time.  Psychiatric:        Behavior: Behavior normal.      Musculoskeletal Exam: C-spine, thoracic spine, and lumbar spine good ROM.  No midline spinal tenderness.  No SI joint tenderness. trapezius muscle tension and tenderness.  Shoulder joints, elbow joints, wrist joints, MCPs, PIPs, and DIPs good ROM with no synovitis.  Complete fist formation bilaterally.  Hip joints, knee joints, ankle joints, MTPs, PIPs, and DIPs good ROM with no synovitis.  No warmth or effusion of knee joints.  No tenderness or swelling of ankle joints.  No tenderness over trochanteric bursa bilaterally.   CDAI Exam: CDAI Score: Not documented Patient Global Assessment: Not documented; Provider Global Assessment: Not documented Swollen: 0 ; Tender: 0  Joint Exam   Not documented   There is currently no information documented on the homunculus. Go to the Rheumatology activity and complete the homunculus joint exam.  Investigation: No  additional findings.  Imaging: Dg Esophagus  Result Date: 11/27/2018 CLINICAL DATA:  Feeling of fullness in the neck EXAM: ESOPHOGRAM / BARIUM SWALLOW / BARIUM TABLET STUDY TECHNIQUE: Combined double contrast and single contrast examination performed using effervescent crystals, thick barium liquid, and thin barium liquid. The patient was observed with fluoroscopy swallowing a 13 mm barium sulphate tablet. FLUOROSCOPY TIME:  Fluoroscopy Time:  1 minutes 0 second Radiation Exposure Index (if provided by the fluoroscopic device): 143 mGy Number of Acquired Spot Images: 0 COMPARISON:  None. FINDINGS: With the patient giving a history of intermittent coughing after swallowing, the study was begun in the lateral projection. No penetration or aspiration is seen. No cervical extrinsic or intrinsic abnormality is noted. Esophageal peristalsis appears normal. A double contrast study shows the mucosa of the esophagus to be normal. No definite hiatal hernia is seen. There is perhaps very minimal narrowing just above the gastroesophageal junction on several views. With the water siphon maneuver, moderate gastroesophageal reflux is demonstrated. Barium pill was given at the end the study which did delay somewhat but did pass into the stomach intact. IMPRESSION: 1. Mild to moderate gastroesophageal reflux. 2. Barium pill delays slightly in passing into the stomach, and therefore a minimal distal esophageal narrowing can not be excluded as questioned on initial images. 3. No intrinsic or extrinsic cervical esophageal abnormality is seen. Electronically Signed   By: Ivar Drape M.D.   On: 11/27/2018 10:21   Xr Hand Complete Right  Result Date: 11/18/2018 No acute or structural abnormalities.   Recent Labs: Lab Results  Component Value Date   WBC 5.0 10/06/2018   HGB 12.2 10/06/2018   PLT 318 10/06/2018   NA 139 10/06/2018   K 4.4 10/06/2018   CL 103 10/06/2018   CO2 22 10/06/2018   GLUCOSE 77 10/06/2018    BUN 11 10/06/2018   CREATININE 0.61 10/06/2018   BILITOT 0.4 10/06/2018   ALKPHOS 119 02/19/2016   AST 16 10/06/2018   ALT 9 10/06/2018   PROT 7.3 10/06/2018   PROT 7.5 10/06/2018   ALBUMIN 4.7 02/19/2016   CALCIUM 9.5 10/06/2018   GFRAA 140 10/06/2018   QFTBGOLDPLUS NEGATIVE 10/06/2018    Speciality Comments: PLQ Eye Exam: 11/11/18 WNL @ [facility] follow up in 1 year  Procedures:  No procedures performed Allergies: Patient has no known allergies.    Assessment / Plan:     Visit  Diagnoses: Other systemic lupus erythematosus with other organ involvement (HCC) - Positive ANA, positive SSA, history of oral ulcers, nasal ulcers, lymphadenopathy, fatigue, weight loss, facial rash, photosensitivity, Raynauds phenomenon: She presents today with a malar rash, worsening fatigue, sicca symptoms, and recurrent oral and nasal ulcers.  She has also been experiencing chills and night sweats.  No cervical lymphadenopathy was palpated. She was started on PLQ in November 2019. Initially she noticed significant improvement on PLQ, but about 1 week ago she developed worsening symptoms.  She has no synovitis on exam today. She has not had any recent symptoms of Raynaud's and no digital ulcerations were noted on exam.  She has not had any palpitations or shortness of breath.  No pericardial or pleural rub auscultated.  She requested a prednisone taper starting at 20 mg and tapering by 5 mg every 4 days.  We will obtain autoimmune labs today to assess for a flare.  She was advised to notify us if she develops any new or worsening symptoms. - Plan: COMPLETE METABOLIC PANEL WITH GFR, CBC with Differential/Platelet, Urinalysis, Routine w reflex microscopic, Anti-DNA antibody, double-stranded, C3 and C4, Sedimentation rate  Raynaud's disease without gangrene: She has not had any recent symptoms of Raynaud's.  She has no digital ulcerations or signs of gangrene.    High risk medication use - PLQ 200  mg p.o. twice daily Monday through Friday.eye exam: 11/11/2018.  CBC and CMP will be drawn today to monitor for drug toxicity. Patient received flu vaccine in October and Prevnar 13 in November 2019.   - Plan: COMPLETE METABOLIC PANEL WITH GFR, CBC with Differential/Platelet  Blood in stool - Dr. Carlean Purl 11/21/2018.  Chronic midline low back pain without sciatica  History of bilateral carpal tunnel release - Dr.Xu.   Orders: Orders Placed This Encounter  Procedures  . COMPLETE METABOLIC PANEL WITH GFR  . CBC with Differential/Platelet  . Urinalysis, Routine w reflex microscopic  . Anti-DNA antibody, double-stranded  . C3 and C4  . Sedimentation rate   Meds ordered this encounter  Medications  . predniSONE (DELTASONE) 5 MG tablet    Sig: Take 4 tablets by mouth daily x4 days, 3 tablets by mouth daily x4 days, 2 tablets by mouth daily x4 days, 1 tablet by mouth daily x4days.    Dispense:  40 tablet    Refill:  0    Face-to-face time spent with patient was 30 minutes. Greater than 50% of time was spent in counseling and coordination of care.  Follow-Up Instructions: Return for Systemic lupus erythematosus, Raynaud's syndrome.   Ofilia Neas, PA-C   I examined and evaluated the patient with Hazel Sams PA.  Patient states that she never did recover completely from her flu like symptoms.  She still have some nasal congestion and fatigue.  She has been noticing increased problems with the oral and nasal ulcers.  She is also noticed malar rash.  Will obtain labs today to monitor disease process.  She was also given a prednisone taper per her request.  We will contact her once lab results are available.  The plan of care was discussed as noted above.  Bo Merino, MD  Note - This record has been created using Editor, commissioning.  Chart creation errors have been sought, but may not always  have been located. Such creation errors do not reflect on  the standard of medical care.

## 2018-12-05 NOTE — Telephone Encounter (Signed)
Last Visit: 11/19/18 Next Visit: 12/17/18 Labs: 10/06/18 cbc/cmp wnl PLQ Eye Exam: 11/11/18 WNL  Okay to refill per Dr. Estanislado Pandy

## 2018-12-17 ENCOUNTER — Other Ambulatory Visit: Payer: Self-pay | Admitting: Internal Medicine

## 2018-12-17 ENCOUNTER — Ambulatory Visit: Payer: Medicaid Other | Admitting: Rheumatology

## 2018-12-17 ENCOUNTER — Encounter: Payer: Self-pay | Admitting: Rheumatology

## 2018-12-17 VITALS — BP 108/76 | HR 74 | Resp 12 | Ht 61.0 in | Wt 137.0 lb

## 2018-12-17 DIAGNOSIS — Z79899 Other long term (current) drug therapy: Secondary | ICD-10-CM

## 2018-12-17 DIAGNOSIS — M3219 Other organ or system involvement in systemic lupus erythematosus: Secondary | ICD-10-CM

## 2018-12-17 DIAGNOSIS — K921 Melena: Secondary | ICD-10-CM

## 2018-12-17 DIAGNOSIS — M545 Low back pain, unspecified: Secondary | ICD-10-CM

## 2018-12-17 DIAGNOSIS — G8929 Other chronic pain: Secondary | ICD-10-CM

## 2018-12-17 DIAGNOSIS — I7300 Raynaud's syndrome without gangrene: Secondary | ICD-10-CM

## 2018-12-17 DIAGNOSIS — Z9889 Other specified postprocedural states: Secondary | ICD-10-CM

## 2018-12-17 MED ORDER — PREDNISONE 5 MG PO TABS: ORAL_TABLET | ORAL | 0 refills | Status: DC

## 2018-12-18 LAB — CBC WITH DIFFERENTIAL/PLATELET
Absolute Monocytes: 610 cells/uL (ref 200–950)
Basophils Absolute: 46 cells/uL (ref 0–200)
Basophils Relative: 0.5 %
Eosinophils Absolute: 109 cells/uL (ref 15–500)
Eosinophils Relative: 1.2 %
HCT: 37.2 % (ref 35.0–45.0)
Hemoglobin: 12.6 g/dL (ref 11.7–15.5)
Lymphs Abs: 1902 cells/uL (ref 850–3900)
MCH: 30.4 pg (ref 27.0–33.0)
MCHC: 33.9 g/dL (ref 32.0–36.0)
MCV: 89.9 fL (ref 80.0–100.0)
MPV: 11.1 fL (ref 7.5–12.5)
Monocytes Relative: 6.7 %
NEUTROS ABS: 6434 {cells}/uL (ref 1500–7800)
Neutrophils Relative %: 70.7 %
Platelets: 387 10*3/uL (ref 140–400)
RBC: 4.14 10*6/uL (ref 3.80–5.10)
RDW: 12.3 % (ref 11.0–15.0)
Total Lymphocyte: 20.9 %
WBC: 9.1 10*3/uL (ref 3.8–10.8)

## 2018-12-18 LAB — COMPLETE METABOLIC PANEL WITH GFR
AG Ratio: 1.5 (calc) (ref 1.0–2.5)
ALT: 13 U/L (ref 6–29)
AST: 17 U/L (ref 10–30)
Albumin: 4.5 g/dL (ref 3.6–5.1)
Alkaline phosphatase (APISO): 88 U/L (ref 33–115)
BUN: 12 mg/dL (ref 7–25)
CO2: 30 mmol/L (ref 20–32)
Calcium: 9.6 mg/dL (ref 8.6–10.2)
Chloride: 103 mmol/L (ref 98–110)
Creat: 0.82 mg/dL (ref 0.50–1.10)
GFR, EST NON AFRICAN AMERICAN: 95 mL/min/{1.73_m2} (ref >=60)
GFR, Est African American: 111 mL/min/{1.73_m2} (ref >=60)
Globulin: 3 g/dL (calc) (ref 1.9–3.7)
Glucose, Bld: 80 mg/dL (ref 65–99)
POTASSIUM: 4 mmol/L (ref 3.5–5.3)
Sodium: 139 mmol/L (ref 135–146)
Total Bilirubin: 0.3 mg/dL (ref 0.2–1.2)
Total Protein: 7.5 g/dL (ref 6.1–8.1)

## 2018-12-18 LAB — URINALYSIS, ROUTINE W REFLEX MICROSCOPIC
Bacteria, UA: NONE SEEN /HPF
Bilirubin Urine: NEGATIVE
Glucose, UA: NEGATIVE
HGB URINE DIPSTICK: NEGATIVE
Hyaline Cast: NONE SEEN /LPF
Ketones, ur: NEGATIVE
Nitrite: NEGATIVE
PROTEIN: NEGATIVE
RBC / HPF: NONE SEEN /HPF (ref 0–2)
Specific Gravity, Urine: 1.007 (ref 1.001–1.03)
Squamous Epithelial / HPF: NONE SEEN /HPF (ref <=5)
pH: 7.5 (ref 5.0–8.0)

## 2018-12-18 LAB — C3 AND C4
C3 Complement: 118 mg/dL (ref 83–193)
C4 Complement: 24 mg/dL (ref 15–57)

## 2018-12-18 LAB — SEDIMENTATION RATE: SED RATE: 14 mm/h (ref 0–20)

## 2018-12-18 LAB — ANTI-DNA ANTIBODY, DOUBLE-STRANDED: ds DNA Ab: 4 IU/mL

## 2018-12-18 NOTE — Progress Notes (Signed)
DsDNA is 4.

## 2018-12-18 NOTE — Progress Notes (Signed)
Labs are not consistent with a flare.  Dr. Estanislado Pandy does not want to increase her dose at this time.

## 2018-12-18 NOTE — Progress Notes (Signed)
CBC and CMP WNL. Sed rate WNL. Complements WNL. UA revealed 2+ leukocytes. Please notify patient and ask patient if she is experiencing any symptoms of a UTI.  Please advise patient to follow up with PCP for evaluation if so.

## 2018-12-31 ENCOUNTER — Telehealth: Payer: Self-pay | Admitting: *Deleted

## 2018-12-31 NOTE — Telephone Encounter (Signed)
She should see her PCP or go to the ER.

## 2018-12-31 NOTE — Telephone Encounter (Addendum)
Patient states she is not feeling well. Patient states she is feeling like her heart is racing and she is having dizziness. Patient states states her hands are shaking Patient states she is having night sweats. Feeling hot then having the chills. Having trouble sleeping. Patient states she is experiecning eye twitching and sores in her scalp, nose, ear and mouth that have been coming and going since Friday 12/26/18. Patient is down to Prednisone 5 mg daily. Patient states she is exhausted and does not have a thermometer to check her temperature. She states she is unable to keep her feet warm. Patient states her entire body feels sensitive including her teeth. Patient states her neck pain has returned. Patient was last seen here on 12/17/18 and due to follow up 01/20/19. Her labs did not indicated a lupus flare at that time. Please advise if she should follow up with [place] or PCP.

## 2018-12-31 NOTE — Telephone Encounter (Signed)
Patient advised to go to PCP or ER for evaluation. Patient verbalized understanding.

## 2019-01-06 NOTE — Progress Notes (Signed)
Office Visit Note  Patient: Evelyn [name]             Date of Birth: 1986/02/13           MRN: [id number]             PCP: Nickola Major, MD Referring: Nickola Major, MD Visit Date: 01/20/2019 Occupation: @GUAROCC @  Subjective:  Generalized pain    History of Present Illness: Evelyn [name] is a 33 y.o. female with history of systemic lupus erythematosus and Raynaud's.  She is taking PLQ 200 mg BID M-F.  She reports the prednisone taper did not help. She was recently diagnosed with fibromyalgia.  She was started on Gabapentin 100 mg in morning, 100 mg in afternoon, and 300 mg at bedtime.  She continues to have generalized muscle aches and muscle tenderness.  She has been having worsening fatigue.  She states she has been having trapezius muscle tension and muscle tenderness.  She has been using a heating pad. She has noticed increased depression and irritability.  She has no SI or HI.  She is taking Prozac as prescribed. She continues to have sicca symptoms.  She reports that she has intermittent oral nasal ulcerations.  She states she continues to notice hair thinning.  She denies any symptoms of Raynaud's.  She denies any palpitations or shortness of breath lately.  She states that she has noticed some changes in her skin on her face but denies any facial redness.  She denies any joint pain or joint swelling at this time.    Activities of Daily Living:  Patient reports morning stiffness for 4 hours.   Patient Denies nocturnal pain.  Difficulty dressing/grooming: Reports Difficulty climbing stairs: Reports Difficulty getting out of chair: Denies Difficulty using hands for taps, buttons, cutlery, and/or writing: Reports  Review of Systems  Constitutional: Positive for fatigue.  HENT: Positive for mouth dryness. Negative for mouth sores and nose dryness.   Eyes: Positive for dryness. Negative for pain and visual disturbance.  Respiratory: Negative for cough,  hemoptysis, shortness of breath and difficulty breathing.   Cardiovascular: Negative for chest pain, palpitations and hypertension.  Gastrointestinal: Negative for blood in stool, constipation and diarrhea.  Endocrine: Negative for increased urination.  Genitourinary: Negative for painful urination.  Musculoskeletal: Positive for arthralgias, joint pain, morning stiffness and muscle tenderness. Negative for joint swelling, myalgias, muscle weakness and myalgias.  Skin: Positive for redness. Negative for color change, pallor, rash, hair loss, nodules/bumps, skin tightness, ulcers and sensitivity to sunlight.  Allergic/Immunologic: Negative for susceptible to infections.  Neurological: Negative for dizziness and headaches.  Hematological: Negative for swollen glands.  Psychiatric/Behavioral: Positive for sleep disturbance. Negative for depressed mood. The patient is not nervous/anxious.     PMFS History:  Patient Active Problem List   Diagnosis Date Noted  . Systemic lupus erythematosus ([place]) 11/19/2018  . Raynaud's disease without gangrene 11/19/2018  . High risk medication use 11/19/2018  . History of bilateral carpal tunnel release 11/19/2018  . Autoimmune disease ([place]) 10/08/2018  . Carpal tunnel syndrome, right upper limb 09/26/2018  . Radicular pain in right arm 09/16/2018  . History of pregnancy induced hypertension 08/03/2014  . History of pre-eclampsia 08/03/2014  . Chronic back pain 01/14/2014  . Allergic rhinitis 01/14/2014    Past Medical History:  Diagnosis Date  . Allergy   . Depression    post partum  . GERD (gastroesophageal reflux disease)   . HELLP syndrome    2015  .  HSV (herpes simplex virus) infection   . Systemic lupus erythematosus ([place])   . Umbilical hernia   . Vaginal delivery 2004, 2009, 2011    Family History  Problem Relation Age of Onset  . Healthy Mother   . Healthy Sister   . Healthy Brother   . Asthma Son   . Healthy Son   . Healthy Son     . Healthy Son   . Healthy Daughter   . Heart disease Neg Hx   . Neurologic Disorder Neg Hx    Past Surgical History:  Procedure Laterality Date  . CARPAL TUNNEL RELEASE Right 09/26/2018   Procedure: RIGHT CARPAL TUNNEL RELEASE;  Surgeon: Leandrew Koyanagi, MD;  Location: [facility];  Service: Orthopedics;  Laterality: Right;  . CARPAL TUNNEL RELEASE Left 2017  . CESAREAN SECTION N/A 08/05/2014   Procedure: CESAREAN SECTION;  Surgeon: Jerelyn Charles, MD;  Location: [facility];  Service: Obstetrics;  Laterality: N/A;  . KNEE SURGERY Left 2000  . LAPAROSCOPIC BILATERAL SALPINGECTOMY N/A 12/13/2015   Procedure: LAPAROSCOPIC BILATERAL SALPINGECTOMY;  Surgeon: Emily Filbert, MD;  Location: [facility];  Service: Gynecology;  Laterality: N/A;  . PERINEOPLASTY N/A 02/20/2016   Procedure: PERINEOPLASTY;  Surgeon: Emily Filbert, MD;  Location: [facility];  Service: Gynecology;  Laterality: N/A;  . PUBOVAGINAL SLING N/A 12/13/2015   Procedure: Gaynelle Arabian;  Surgeon: Emily Filbert, MD;  Location: [facility];  Service: Gynecology;  Laterality: N/A;  . RECTOCELE REPAIR N/A 02/20/2016   Procedure: POSTERIOR REPAIR (RECTOCELE);  Surgeon: Emily Filbert, MD;  Location: [facility];  Service: Gynecology;  Laterality: N/A;  . UMBILICAL HERNIA REPAIR  2011   Social History   Social History Narrative   Originally from [place], graduated [employer]   Married.  3 sons one daughter, birth years 2004, 2009, 2011, 2015    Pt does not exercise.   She works part-time at [employer] when she can   Highest level of education: 12th grade   No alcohol tobacco or drug use   1 coffee daily   Immunization History  Administered Date(s) Administered  . Influenza,inj,Quad PF,6+ Mos 08/11/2014, 09/07/2016, 11/04/2017, 09/24/2018  . Pneumococcal Conjugate-13 10/20/2018  . Tdap 08/06/2014     Objective: Vital Signs: BP 99/61 (BP Location: Left Arm, Patient Position: Sitting, Cuff Size: Normal)   Pulse 72   Resp 12    Ht 5\' 1"  (1.549 m)   Wt 135 lb 9.6 oz (61.5 kg)   BMI 25.62 kg/m    Physical Exam Vitals signs and nursing note reviewed.  Constitutional:      Appearance: She is well-developed.  HENT:     Head: Normocephalic and atraumatic.     Nose:     Comments: No nasal ulcerations     Mouth/Throat:     Comments: No oral ulcerations Eyes:     Conjunctiva/sclera: Conjunctivae normal.  Neck:     Musculoskeletal: Normal range of motion.  Cardiovascular:     Rate and Rhythm: Normal rate and regular rhythm.     Heart sounds: Normal heart sounds.  Pulmonary:     Effort: Pulmonary effort is normal.     Breath sounds: Normal breath sounds.  Abdominal:     General: Bowel sounds are normal.     Palpations: Abdomen is soft.  Lymphadenopathy:     Cervical: No cervical adenopathy.  Skin:    General: Skin is warm and dry.     Capillary  Refill: Capillary refill takes less than 2 seconds.  Neurological:     Mental Status: She is alert and oriented to person, place, and time.  Psychiatric:        Behavior: Behavior normal.      Musculoskeletal Exam: C-spine, thoracic spine, lumbar spine good range of motion.  No midline spinal tenderness.  No SI joint tenderness.  Shoulder joints, elbow joints, wrist joints, MCPs, PIPs, DIPs good range of motion with no synovitis.  She has complete fist formation bilaterally.  Hip joints, knee joints, ankle joints, MTPs, PIPs, DIPs good range of motion no synovitis.  No warmth or effusion bilateral knee joints.  No tenderness or swelling of ankle joints.  CDAI Exam: CDAI Score: Not documented Patient Global Assessment: Not documented; Provider Global Assessment: Not documented Swollen: Not documented; Tender: Not documented Joint Exam   Not documented   There is currently no information documented on the homunculus. Go to the Rheumatology activity and complete the homunculus joint exam.  Investigation: No additional findings.  Imaging: No results  found.  Recent Labs: Lab Results  Component Value Date   WBC 9.1 12/17/2018   HGB 12.6 12/17/2018   PLT 387 12/17/2018   NA 139 12/17/2018   K 4.0 12/17/2018   CL 103 12/17/2018   CO2 30 12/17/2018   GLUCOSE 80 12/17/2018   BUN 12 12/17/2018   CREATININE 0.82 12/17/2018   BILITOT 0.3 12/17/2018   ALKPHOS 119 02/19/2016   AST 17 12/17/2018   ALT 13 12/17/2018   PROT 7.5 12/17/2018   ALBUMIN 4.7 02/19/2016   CALCIUM 9.6 12/17/2018   GFRAA 111 12/17/2018   QFTBGOLDPLUS NEGATIVE 10/06/2018    Speciality Comments: PLQ Eye Exam: 11/11/18 WNL @ [facility] follow up in 1 year  Procedures:  No procedures performed Allergies: Patient has no known allergies.   Assessment / Plan:     Visit Diagnoses: Other systemic lupus erythematosus with other organ involvement (HCC) - Positive ANA, positive SSA, history of oral ulcers, nasal ulcers, lymphadenopathy, fatigue, weight loss, facial rash, photosensitivity,Raynauds phenomenon: She has not had any recent lupus flares.  She continues take Plaquenil 200 mg 1 tablet twice daily Monday through Friday.  She has no signs or symptoms of a lupus flare at this time.  Autoimmune labs obtained on 12/17/2018 were within normal limits.  She will continue taking Plaquenil 200 mg 1 tablet twice daily Monday through Friday.  She does not need any refills at this time.  She was advised to notify us if she develops any signs or symptoms of a lupus flare.  She will follow-up in the office in 5 months.  We will recheck autoimmune labs at that time.  Raynaud's disease without gangrene: She has not had any recent symptoms of Raynaud's.  She has no digital ulcerations or signs of gangrene.   High risk medication use - PLQ 200 mg p.o. twice daily Monday through Friday.eye exam: 11/11/2018.  Most recent CBC/CMP within normal limits on 12/17/2018 and will monitor every 5 months.  Standing orders are in place.  Fibromyalgia: She was recently diagnosed with  fibromyalgia by her PCP. She was started on Gabapentin 100 mg po in the AM, 100 mg po at noon, and 300 mg at bedtime.  She initially noticed benefit taking Gabapentin, but she has been having increased generalize muscle aches and muscle tenderness.  She has positive tender points on exam.  She has chronic fatigue and insomnia.  We had a detailed  discussion about the diagnosis of fibromyalgia.  We discussed possible pharmacological treatment options as well as conservative treatment options.  She is in a continue taking gabapentin as prescribed.  She is currently prescribed Prozac we discussed talking to her primary care about possibly switching to Cymbalta.  She has been taking Tylenol for pain relief.  She was advised to take Tylenol very sparingly.  We discussed possibly trying Robaxin 500 mg 1 tablet as needed for muscle spasms in the future.  We discussed the importance of regular exercise and good sleep hygiene.  We also discussed possible physical therapy as well as water aerobics in the future.  All questions were addressed.  She will be following up with PCP in March 2020.    Chronic midline low back pain without sciatica: She has no midline spinal tenderness or symptoms of sciatica at this time.   History of bilateral carpal tunnel release - Dr. Erlinda Hong   Blood in stool - Dr. Carlean Purl    Orders: No orders of the defined types were placed in this encounter.  No orders of the defined types were placed in this encounter.   Face-to-face time spent with patient was 30 minutes. Greater than 50% of time was spent in counseling and coordination of care.  Follow-Up Instructions: Return in about 5 months (around 06/20/2019) for Systemic lupus erythematosus, Fibromyalgia.   Ofilia Neas, PA-C   I examined and evaluated the patient with Hazel Sams PA.  Patient had no synovitis on my examination today.  She had mild facial erythema.she continues to have some generalized pain and discomfort due to  fibromyalgia.  The plan of care was discussed as noted above.  Bo Merino, MD Note - This record has been created using Editor, commissioning.  Chart creation errors have been sought, but may not always  have been located. Such creation errors do not reflect on  the standard of medical care.

## 2019-01-14 ENCOUNTER — Ambulatory Visit: Payer: Medicaid Other | Admitting: Physician Assistant

## 2019-01-20 ENCOUNTER — Encounter: Payer: Self-pay | Admitting: Rheumatology

## 2019-01-20 ENCOUNTER — Ambulatory Visit: Payer: Medicaid Other | Admitting: Rheumatology

## 2019-01-20 VITALS — BP 99/61 | HR 72 | Resp 12 | Ht 61.0 in | Wt 135.6 lb

## 2019-01-20 DIAGNOSIS — K921 Melena: Secondary | ICD-10-CM

## 2019-01-20 DIAGNOSIS — G8929 Other chronic pain: Secondary | ICD-10-CM

## 2019-01-20 DIAGNOSIS — M545 Low back pain: Secondary | ICD-10-CM

## 2019-01-20 DIAGNOSIS — M3219 Other organ or system involvement in systemic lupus erythematosus: Secondary | ICD-10-CM

## 2019-01-20 DIAGNOSIS — M797 Fibromyalgia: Secondary | ICD-10-CM | POA: Diagnosis not present

## 2019-01-20 DIAGNOSIS — Z9889 Other specified postprocedural states: Secondary | ICD-10-CM

## 2019-01-20 DIAGNOSIS — Z79899 Other long term (current) drug therapy: Secondary | ICD-10-CM

## 2019-01-20 DIAGNOSIS — I7300 Raynaud's syndrome without gangrene: Secondary | ICD-10-CM | POA: Diagnosis not present

## 2019-01-27 ENCOUNTER — Encounter: Payer: Self-pay | Admitting: Internal Medicine

## 2019-01-27 ENCOUNTER — Ambulatory Visit: Payer: Medicaid Other | Admitting: Internal Medicine

## 2019-01-27 VITALS — BP 102/70 | HR 76 | Ht 61.0 in | Wt 137.4 lb

## 2019-01-27 DIAGNOSIS — R1310 Dysphagia, unspecified: Secondary | ICD-10-CM

## 2019-01-27 DIAGNOSIS — K649 Unspecified hemorrhoids: Secondary | ICD-10-CM | POA: Diagnosis not present

## 2019-01-27 DIAGNOSIS — K219 Gastro-esophageal reflux disease without esophagitis: Secondary | ICD-10-CM | POA: Diagnosis not present

## 2019-01-27 MED ORDER — PANTOPRAZOLE SODIUM 40 MG PO TBEC: 40.0000 mg | DELAYED_RELEASE_TABLET | Freq: Every day | ORAL | 3 refills | Status: DC

## 2019-01-27 NOTE — Progress Notes (Signed)
Evelyn [name] 32 y.o. 05/13/1986 [id number]  Assessment & Plan:   Encounter Diagnoses  Name Primary?  . Gastroesophageal reflux disease, esophagitis presence not specified Yes  . Pill dysphagia   . Bleeding hemorrhoids - symptoms resolved     She is overall significantly better.  She has responded nicely to a PPI.  I do not think an endoscopy is necessary.  The pill dysphagia is not something that needs to be scoped, she may have some dry mouth issues with her autoimmunity problems.  I am going to switch her from omeprazole capsule to pantoprazole tablet.  If she still has problems she will let me know otherwise she can follow-up as needed and she should be able to get refills through primary care for GERD medication.  If she is unable to take an antacid (she will check with rheumatology) and intermittent Pepcid over-the-counter is reasonable if she has some breakthrough symptoms.  If she has dysphagia or progressive problems not adequately controlled by PPI endoscopy would most likely be indicated.  She will continue conservative care of her constipation and hemorrhoids.  I appreciate the opportunity to care for this patient. CC: Nickola Major, MD    Subjective:   Chief Complaint: Follow-up of GERD, dysphagia and hemorrhoids with constipation  HPI Evelyn [name] returns for follow-up I saw her in late December with reflux symptoms, liquid dysphagia and bleeding hemorrhoids.  She had recently been diagnosed with lupus.  A barium swallow showed normal motility though there was transient sticking of a 13 mm tablet at the GE junction.  She reports overall she is significantly improved.  Reflux is much less common.  The omeprazole has worked well but it and her gabapentin capsules seem to hang in her pharynx.  Off to the side on the right.  She will feel like she needs to drink a lot of water after that.  No food dysphagia or liquid dysphagia.  She is unable to take antacids because of  Plaquenil and an interaction she was told.  With fiber supplementation constipation is better and there is no more bleeding from her hemorrhoids.  She has been having some episodes of fogginess like today she knew where she was going but struggled with some of the roads.  She says Dr. Estanislado Pandy told her this would happen with her illnesses.  She is not been overtly lost or significantly confused as far as I can tell.  She also recently had gabapentin increased.  She says she has gone back on coffee after eliminating it, 1 cup a day because she now also has chronic fatigue syndrome. No Known Allergies Current Meds  Medication Sig  . Cholecalciferol (VITAMIN D) 50 MCG (2000 UT) CAPS Take by mouth daily.  . fluocinonide gel (LIDEX) 0.05 % as needed.  Marland Kitchen FLUoxetine (PROZAC) 20 MG capsule daily.  Marland Kitchen gabapentin (NEURONTIN) 300 MG capsule Take 1 tablet by mouth twice daily, then 600 mg at bedtime.  . hydrocortisone 2.5 % cream APP EXT AA BID FOR FACIAL RASH  . hydroxychloroquine (PLAQUENIL) 200 MG tablet TAKE 1 TABLET BY MOUTH TWICE DAILY ON MONDAY THROUGH FRIDAY  . LEVONORGESTREL IU 1 each by Intrauterine route once.   Marland Kitchen omeprazole (PRILOSEC) 40 MG capsule TAKE 1 CAPSULE(40 MG) BY MOUTH DAILY BEFORE BREAKFAST  . valACYclovir (VALTREX) 1000 MG tablet Take 500 mg by mouth as needed (outbreak).    Past Medical History:  Diagnosis Date  . Allergy   . Depression    post  partum  . GERD (gastroesophageal reflux disease)   . HELLP syndrome    2015  . HSV (herpes simplex virus) infection   . Systemic lupus erythematosus ([place])   . Umbilical hernia   . Vaginal delivery 2004, 2009, 2011   Past Surgical History:  Procedure Laterality Date  . CARPAL TUNNEL RELEASE Right 09/26/2018   Procedure: RIGHT CARPAL TUNNEL RELEASE;  Surgeon: Leandrew Koyanagi, MD;  Location: [facility];  Service: Orthopedics;  Laterality: Right;  . CARPAL TUNNEL RELEASE Left 2017  . CESAREAN SECTION N/A 08/05/2014    Procedure: CESAREAN SECTION;  Surgeon: Jerelyn Charles, MD;  Location: [facility];  Service: Obstetrics;  Laterality: N/A;  . KNEE SURGERY Left 2000  . LAPAROSCOPIC BILATERAL SALPINGECTOMY N/A 12/13/2015   Procedure: LAPAROSCOPIC BILATERAL SALPINGECTOMY;  Surgeon: Emily Filbert, MD;  Location: [facility];  Service: Gynecology;  Laterality: N/A;  . PERINEOPLASTY N/A 02/20/2016   Procedure: PERINEOPLASTY;  Surgeon: Emily Filbert, MD;  Location: [facility];  Service: Gynecology;  Laterality: N/A;  . PUBOVAGINAL SLING N/A 12/13/2015   Procedure: Gaynelle Arabian;  Surgeon: Emily Filbert, MD;  Location: [facility];  Service: Gynecology;  Laterality: N/A;  . RECTOCELE REPAIR N/A 02/20/2016   Procedure: POSTERIOR REPAIR (RECTOCELE);  Surgeon: Emily Filbert, MD;  Location: [facility];  Service: Gynecology;  Laterality: N/A;  . UMBILICAL HERNIA REPAIR  2011   Social History   Social History Narrative   Originally from [place], graduated [employer]   Married.  3 sons one daughter, birth years 2004, 2009, 2011, 2015    Pt does not exercise.   She works part-time at [employer] when she can   Highest level of education: 12th grade   No alcohol tobacco or drug use   1 coffee daily   family history includes Asthma in her son; Healthy in her brother, daughter, mother, sister, son, son, and son.   Review of Systems See HPI  Objective:   Physical Exam BP 102/70   Pulse 76   Ht 5\' 1"  (1.549 m)   Wt 137 lb 6.4 oz (62.3 kg)   BMI 25.96 kg/m  No acute distress

## 2019-01-27 NOTE — Patient Instructions (Signed)
We have sent the following medications to your pharmacy for you to pick up at your convenience: Pantoprazole 40 mg daily (in place of omeprazole)  Please follow up as needed  If you are age 32 or older, your body mass index should be between 23-30. Your Body mass index is 25.96 kg/m. If this is out of the aforementioned range listed, please consider follow up with your Primary Care Provider.  If you are age 6 or younger, your body mass index should be between 19-25. Your Body mass index is 25.96 kg/m. If this is out of the aformentioned range listed, please consider follow up with your Primary Care Provider.

## 2019-02-09 ENCOUNTER — Other Ambulatory Visit (INDEPENDENT_AMBULATORY_CARE_PROVIDER_SITE_OTHER): Payer: Medicaid Other

## 2019-02-09 VITALS — BP 130/78 | HR 90

## 2019-02-09 DIAGNOSIS — R300 Dysuria: Secondary | ICD-10-CM

## 2019-02-09 LAB — POCT URINE QUALITATIVE DIPSTICK BLOOD: Blood, UA: NEGATIVE

## 2019-02-09 NOTE — Progress Notes (Signed)
SUBJECTIVE: Evelyn [name] is a 33 y.o. female who complains of urinary frequency, urgency and dysuria x 3-4 days, without flank pain, fever, chills, or abnormal vaginal discharge or bleeding.   OBJECTIVE: Appears well, in no apparent distress. Vital signs are normal. 121/78. Urine dipstick shows nothing.    ASSESSMENT: Dysuria  PLAN: Treatment per orders. Call or return to clinic prn if these symptoms worsen or fail to improve as anticipated. Patient did not want to send her urine off for culture. She will call back to schedule appointment if symptoms do not improved.

## 2019-02-17 ENCOUNTER — Other Ambulatory Visit: Payer: Self-pay | Admitting: Rheumatology

## 2019-02-17 NOTE — Telephone Encounter (Signed)
Last Visit: 01/20/19 Next visit: 06/22/19 Labs: 12/17/18 CBC and CMP WNL PLQ Eye Exam: 11/11/18 WNL  Okay to refill per Dr. Estanislado Pandy

## 2019-03-04 ENCOUNTER — Telehealth: Payer: Self-pay

## 2019-03-04 ENCOUNTER — Ambulatory Visit: Payer: Medicaid Other

## 2019-03-04 ENCOUNTER — Other Ambulatory Visit: Payer: Self-pay

## 2019-03-04 ENCOUNTER — Other Ambulatory Visit (HOSPITAL_COMMUNITY)
Admission: RE | Admit: 2019-03-04 | Discharge: 2019-03-04 | Disposition: A | Discharge status: Home / Self Care | Payer: Medicaid Other | Source: Ambulatory Visit | Attending: Family Medicine | Admitting: Family Medicine

## 2019-03-04 VITALS — BP 110/75 | HR 72

## 2019-03-04 DIAGNOSIS — N898 Other specified noninflammatory disorders of vagina: Secondary | ICD-10-CM

## 2019-03-04 MED ORDER — VALACYCLOVIR HCL 500 MG PO TABS: 500.0000 mg | ORAL_TABLET | Freq: Two times a day (BID) | ORAL | 1 refills | Status: DC

## 2019-03-04 MED ORDER — FLUCONAZOLE 150 MG PO TABS: 150.0000 mg | ORAL_TABLET | Freq: Once | ORAL | 0 refills | Status: AC

## 2019-03-04 NOTE — Telephone Encounter (Signed)
Called patient regarding symptoms of yeast infection. LMTCB

## 2019-03-04 NOTE — Progress Notes (Signed)
Attestation of Attending Supervision of clinical support staff: I agree with the care provided to this patient and was available for any consultation.  I have reviewed the CMA's note and chart, and I agree with the management and plan.  Zuleyka Kloc Niles Sarabelle Genson, MD, MPH, ABFM Attending Physician Faculty Practice- [facility]  

## 2019-03-04 NOTE — Telephone Encounter (Signed)
-----   Message from Allena Earing, NT sent at 03/04/2019  8:32 AM EDT ----- Patient came to office on 02/09/19 for a UA for possible UTI, test was negative, however she was told if symptoms persist to return for an exam. Her symptoms are an "uncomfortable itching", and discharge is like "cottage cheese" and is vaginal pain. No odor.  Please advise pt what to do, need to come to office or just send in med

## 2019-03-04 NOTE — Progress Notes (Signed)
SUBJECTIVE:  33 y.o. female complains of  vaginal discharge for a couple of days.  Denies abnormal vaginal bleeding or significant pelvic pain or fever. No UTI symptoms. Denies history of known exposure to STD.  No LMP recorded. (Menstrual status: IUD).  OBJECTIVE:  She appears well, afebrile. Urine dipstick: N/A  ASSESSMENT:  Vaginal Discharge small amount  Vaginal Odor: small amount    PLAN:  GC, chlamydia, trichomonas, BVAG, CVAG probe sent to lab. Treatment: To be determined once lab results are received ROV prn if symptoms persist or worsen. Patient would like a refill on her valtrex and would like to have a diflucion called into her pharmacy.

## 2019-03-04 NOTE — Addendum Note (Signed)
Addended by: Phillip Heal, Kanya Potteiger A on: 03/04/2019 11:23 AM   Modules accepted: Orders

## 2019-03-05 LAB — CERVICOVAGINAL ANCILLARY ONLY
Bacterial vaginitis: POSITIVE — AB
Candida vaginitis: POSITIVE — AB
Chlamydia: NEGATIVE
Neisseria Gonorrhea: NEGATIVE
Trichomonas: NEGATIVE

## 2019-03-06 ENCOUNTER — Encounter: Payer: Self-pay | Admitting: Rheumatology

## 2019-03-09 ENCOUNTER — Telehealth: Payer: Self-pay | Admitting: *Deleted

## 2019-03-09 MED ORDER — METRONIDAZOLE 500 MG PO TABS: 500.0000 mg | ORAL_TABLET | Freq: Two times a day (BID) | ORAL | 0 refills | Status: AC

## 2019-03-09 NOTE — Telephone Encounter (Signed)
Returned patient call regarding wanting her results. Informed of results and will send in RX for BV as patient already recieved meds for yeast infection.

## 2019-03-16 ENCOUNTER — Other Ambulatory Visit: Payer: Self-pay | Admitting: Internal Medicine

## 2019-03-30 ENCOUNTER — Telehealth: Payer: Self-pay | Admitting: *Deleted

## 2019-03-30 MED ORDER — FLUCONAZOLE 150 MG PO TABS: 150.0000 mg | ORAL_TABLET | Freq: Once | ORAL | 1 refills | Status: AC

## 2019-03-30 NOTE — Telephone Encounter (Signed)
Pt called stating she has finished her meds for her BV and yeast, but is still having itching and discharge. Explained to patient she may had another yeast infection from the meds we gave ger for the BV and will send in another RX for diflucan to take. If it does not get better after taking meds, to call office back to get an appointment for further evaluation. Pt verbalizes and understands.

## 2019-04-07 ENCOUNTER — Encounter: Payer: Self-pay | Admitting: Rheumatology

## 2019-04-07 DIAGNOSIS — M3219 Other organ or system involvement in systemic lupus erythematosus: Secondary | ICD-10-CM

## 2019-04-07 NOTE — Telephone Encounter (Signed)
I returned patient's call.  Patient has an appointment with her PCP at 2:30 PM.  Please order following labs: CBC, CMP, UA, ESR, C3-C4, dsDNA.  I have advised patient to go to the [facility] to get her labs done today.

## 2019-04-07 NOTE — Telephone Encounter (Signed)
Lab orders have been placed and released for [facility]

## 2019-04-13 LAB — CBC WITH DIFFERENTIAL/PLATELET
Absolute Monocytes: 349 cells/uL (ref 200–950)
Basophils Absolute: 42 cells/uL (ref 0–200)
Basophils Relative: 1 %
Eosinophils Absolute: 71 cells/uL (ref 15–500)
Eosinophils Relative: 1.7 %
HCT: 39.6 % (ref 35.0–45.0)
Hemoglobin: 13.6 g/dL (ref 11.7–15.5)
Lymphs Abs: 1399 cells/uL (ref 850–3900)
MCH: 31.7 pg (ref 27.0–33.0)
MCHC: 34.3 g/dL (ref 32.0–36.0)
MCV: 92.3 fL (ref 80.0–100.0)
MPV: 11 fL (ref 7.5–12.5)
Monocytes Relative: 8.3 %
Neutro Abs: 2339 cells/uL (ref 1500–7800)
Neutrophils Relative %: 55.7 %
Platelets: 325 10*3/uL (ref 140–400)
RBC: 4.29 10*6/uL (ref 3.80–5.10)
RDW: 13.2 % (ref 11.0–15.0)
Total Lymphocyte: 33.3 %
WBC: 4.2 10*3/uL (ref 3.8–10.8)

## 2019-04-13 LAB — URINALYSIS, ROUTINE W REFLEX MICROSCOPIC
Bilirubin Urine: NEGATIVE
Glucose, UA: NEGATIVE
Hgb urine dipstick: NEGATIVE
Hyaline Cast: NONE SEEN /LPF
Ketones, ur: NEGATIVE
Leukocytes,Ua: NEGATIVE
Nitrite: NEGATIVE
Specific Gravity, Urine: 1.024 (ref 1.001–1.03)
pH: 7.5 (ref 5.0–8.0)

## 2019-04-13 LAB — ANTI-DNA ANTIBODY, DOUBLE-STRANDED: ds DNA Ab: 3 IU/mL

## 2019-04-13 LAB — COMPLETE METABOLIC PANEL WITH GFR
AG Ratio: 1.7 (calc) (ref 1.0–2.5)
ALT: 14 U/L (ref 6–29)
AST: 21 U/L (ref 10–30)
Albumin: 5 g/dL (ref 3.6–5.1)
Alkaline phosphatase (APISO): 82 U/L (ref 31–125)
BUN: 11 mg/dL (ref 7–25)
CO2: 27 mmol/L (ref 20–32)
Calcium: 10.3 mg/dL — ABNORMAL HIGH (ref 8.6–10.2)
Chloride: 102 mmol/L (ref 98–110)
Creat: 0.79 mg/dL (ref 0.50–1.10)
GFR, Est African American: 116 mL/min/{1.73_m2} (ref >=60)
GFR, Est Non African American: 100 mL/min/{1.73_m2} (ref >=60)
Globulin: 3 g/dL (calc) (ref 1.9–3.7)
Glucose, Bld: 81 mg/dL (ref 65–139)
Potassium: 3.8 mmol/L (ref 3.5–5.3)
Sodium: 139 mmol/L (ref 135–146)
Total Bilirubin: 0.6 mg/dL (ref 0.2–1.2)
Total Protein: 8 g/dL (ref 6.1–8.1)

## 2019-04-13 LAB — C3 AND C4
C3 Complement: 107 mg/dL (ref 83–193)
C4 Complement: 23 mg/dL (ref 15–57)

## 2019-04-13 LAB — SEDIMENTATION RATE: Sed Rate: 11 mm/h (ref 0–20)

## 2019-04-14 ENCOUNTER — Encounter: Payer: Self-pay | Admitting: Rheumatology

## 2019-04-14 NOTE — Telephone Encounter (Signed)
Labs are unremarkable.

## 2019-05-08 ENCOUNTER — Other Ambulatory Visit: Payer: Self-pay | Admitting: Rheumatology

## 2019-05-08 NOTE — Telephone Encounter (Signed)
Last Visit: 01/20/19 Next visit: 06/22/19 Labs: 04/10/19 unremarkable. PLQ Eye Exam: 11/11/18 WNL  Okay to refill per Dr. Estanislado Pandy

## 2019-06-08 NOTE — Progress Notes (Deleted)
Office Visit Note  Patient: Evelyn [name]             Date of Birth: 04-06-1986           MRN: [id number]             PCP: Nickola Major, MD Referring: Nickola Major, MD Visit Date: 06/22/2019 Occupation: @GUAROCC @  Subjective:  No chief complaint on file.   Plaquenil 200 mg 1 tablet twice daily Monday through Friday only.  Last Plaquenil eye exam normal on 11/11/2018.  Most recent CBC/CMP within normal limits on 04/10/2019 will monitor every 5 months.  She received the flu vaccine in October and previously Prevnar 13.  Recommend Pneumovax 23 as indicated for immunosuppressant therapy.  History of Present Illness: Evelyn [name] is a 33 y.o. female ***   Activities of Daily Living:  Patient reports morning stiffness for *** {minute/hour:19697}.   Patient {ACTIONS;DENIES/REPORTS:21021675::"Denies"} nocturnal pain.  Difficulty dressing/grooming: {ACTIONS;DENIES/REPORTS:21021675::"Denies"} Difficulty climbing stairs: {ACTIONS;DENIES/REPORTS:21021675::"Denies"} Difficulty getting out of chair: {ACTIONS;DENIES/REPORTS:21021675::"Denies"} Difficulty using hands for taps, buttons, cutlery, and/or writing: {ACTIONS;DENIES/REPORTS:21021675::"Denies"}  No Rheumatology ROS completed.   PMFS History:  Patient Active Problem List   Diagnosis Date Noted  . Gastroesophageal reflux disease 01/27/2019  . Systemic lupus erythematosus ([place]) 11/19/2018  . Raynaud's disease without gangrene 11/19/2018  . High risk medication use 11/19/2018  . History of bilateral carpal tunnel release 11/19/2018  . Autoimmune disease ([place]) 10/08/2018  . Carpal tunnel syndrome, right upper limb 09/26/2018  . Radicular pain in right arm 09/16/2018  . History of pregnancy induced hypertension 08/03/2014  . History of pre-eclampsia 08/03/2014  . Chronic back pain 01/14/2014  . Allergic rhinitis 01/14/2014    Past Medical History:  Diagnosis Date  . Allergy   . Depression    post partum  .  GERD (gastroesophageal reflux disease)   . HELLP syndrome    2015  . HSV (herpes simplex virus) infection   . Systemic lupus erythematosus ([place])   . Umbilical hernia   . Vaginal delivery 2004, 2009, 2011    Family History  Problem Relation Age of Onset  . Healthy Mother   . Healthy Sister   . Healthy Brother   . Asthma Son   . Healthy Son   . Healthy Son   . Healthy Son   . Healthy Daughter   . Heart disease Neg Hx   . Neurologic Disorder Neg Hx   . Colon cancer Neg Hx   . Esophageal cancer Neg Hx    Past Surgical History:  Procedure Laterality Date  . CARPAL TUNNEL RELEASE Right 09/26/2018   Procedure: RIGHT CARPAL TUNNEL RELEASE;  Surgeon: Leandrew Koyanagi, MD;  Location: [facility];  Service: Orthopedics;  Laterality: Right;  . CARPAL TUNNEL RELEASE Left 2017  . CESAREAN SECTION N/A 08/05/2014   Procedure: CESAREAN SECTION;  Surgeon: Jerelyn Charles, MD;  Location: [facility];  Service: Obstetrics;  Laterality: N/A;  . KNEE SURGERY Left 2000  . LAPAROSCOPIC BILATERAL SALPINGECTOMY N/A 12/13/2015   Procedure: LAPAROSCOPIC BILATERAL SALPINGECTOMY;  Surgeon: Emily Filbert, MD;  Location: [facility];  Service: Gynecology;  Laterality: N/A;  . PERINEOPLASTY N/A 02/20/2016   Procedure: PERINEOPLASTY;  Surgeon: Emily Filbert, MD;  Location: [facility];  Service: Gynecology;  Laterality: N/A;  . PUBOVAGINAL SLING N/A 12/13/2015   Procedure: Gaynelle Arabian;  Surgeon: Emily Filbert, MD;  Location: [facility];  Service: Gynecology;  Laterality: N/A;  . RECTOCELE REPAIR N/A 02/20/2016  Procedure: POSTERIOR REPAIR (RECTOCELE);  Surgeon: Emily Filbert, MD;  Location: [facility];  Service: Gynecology;  Laterality: N/A;  . UMBILICAL HERNIA REPAIR  2011   Social History   Social History Narrative   Originally from [place], graduated [employer]   Married.  3 sons one daughter, birth years 2004, 2009, 2011, 2015    Pt does not exercise.   She works part-time at [employer] when she can    Highest level of education: 12th grade   No alcohol tobacco or drug use   1 coffee daily   Immunization History  Administered Date(s) Administered  . Influenza,inj,Quad PF,6+ Mos 08/11/2014, 09/07/2016, 11/04/2017, 09/24/2018  . Pneumococcal Conjugate-13 10/20/2018  . Tdap 08/06/2014     Objective: Vital Signs: There were no vitals taken for this visit.   Physical Exam   Musculoskeletal Exam: ***  CDAI Exam: CDAI Score: - Patient Global: -; Provider Global: - Swollen: -; Tender: - Joint Exam   No joint exam has been documented for this visit   There is currently no information documented on the homunculus. Go to the Rheumatology activity and complete the homunculus joint exam.  Investigation: No additional findings.  Imaging: No results found.  Recent Labs: Lab Results  Component Value Date   WBC 4.2 04/10/2019   HGB 13.6 04/10/2019   PLT 325 04/10/2019   NA 139 04/10/2019   K 3.8 04/10/2019   CL 102 04/10/2019   CO2 27 04/10/2019   GLUCOSE 81 04/10/2019   BUN 11 04/10/2019   CREATININE 0.79 04/10/2019   BILITOT 0.6 04/10/2019   ALKPHOS 119 02/19/2016   AST 21 04/10/2019   ALT 14 04/10/2019   PROT 8.0 04/10/2019   ALBUMIN 4.7 02/19/2016   CALCIUM 10.3 (H) 04/10/2019   GFRAA 116 04/10/2019   QFTBGOLDPLUS NEGATIVE 10/06/2018    Speciality Comments: PLQ Eye Exam: 11/11/18 WNL @ [facility] follow up in 1 year  Procedures:  No procedures performed Allergies: Patient has no known allergies.   Assessment / Plan:     Visit Diagnoses: No diagnosis found.   Orders: No orders of the defined types were placed in this encounter.  No orders of the defined types were placed in this encounter.   Face-to-face time spent with patient was *** minutes. Greater than 50% of time was spent in counseling and coordination of care.  Follow-Up Instructions: No follow-ups on file.   Earnestine Mealing, CMA  Note - This record has been created using  Editor, commissioning.  Chart creation errors have been sought, but may not always  have been located. Such creation errors do not reflect on  the standard of medical care.

## 2019-06-19 ENCOUNTER — Other Ambulatory Visit: Payer: Self-pay | Admitting: Family Medicine

## 2019-06-22 ENCOUNTER — Ambulatory Visit: Payer: Medicaid Other | Admitting: Rheumatology

## 2019-06-22 ENCOUNTER — Other Ambulatory Visit: Payer: Self-pay

## 2019-06-23 ENCOUNTER — Ambulatory Visit (INDEPENDENT_AMBULATORY_CARE_PROVIDER_SITE_OTHER): Payer: Medicaid Other | Admitting: Obstetrics and Gynecology

## 2019-06-23 ENCOUNTER — Encounter: Payer: Self-pay | Admitting: Obstetrics and Gynecology

## 2019-06-23 ENCOUNTER — Other Ambulatory Visit: Payer: Self-pay

## 2019-06-23 VITALS — BP 112/73 | HR 72 | Wt 130.0 lb

## 2019-06-23 DIAGNOSIS — N898 Other specified noninflammatory disorders of vagina: Secondary | ICD-10-CM | POA: Diagnosis not present

## 2019-06-23 DIAGNOSIS — B373 Candidiasis of vulva and vagina: Secondary | ICD-10-CM

## 2019-06-23 DIAGNOSIS — B3731 Acute candidiasis of vulva and vagina: Secondary | ICD-10-CM

## 2019-06-23 MED ORDER — DERMOPLAST 20-0.5 % EX AERO: 1.0000 "application " | INHALATION_SPRAY | Freq: Four times a day (QID) | CUTANEOUS | 0 refills | Status: DC | PRN

## 2019-06-23 NOTE — Progress Notes (Addendum)
Obstetrics and Gynecology Visit Return Patient Evaluation  Appointment Date: 06/23/2019  Primary Care Provider: Eks[place], [place] for [facility]  Chief Complaint: recurrent vaginal irritation  History of Present Illness:  Evelyn [name] is a 33 y.o. with the above CC. PMHx significant for h/o HSV, lupus.   Patient states that starting about 3 months she's had recurrent vaginal irritation. She hasn't tried anything OTC to help with this. She had a swab on 4/1 that showed BV and candida. She got diflucan 150 x 1 on 4/27, flagyl 500 bid x 7d on 4/6, diflucan 150mg  x 1 on 4/1.    Pap neg 2019  She currently continues to have vaginal irritation. She's also unsure about hsv outbreak b/c it's so irritated. She does not douche or tub bathe, swimming. No d/c, bleeding  Review of Systems:  as noted in the History of Present Illness.  Patient Active Problem List   Diagnosis Date Noted  . Gastroesophageal reflux disease 01/27/2019  . Systemic lupus erythematosus ([place]) 11/19/2018  . Raynaud's disease without gangrene 11/19/2018  . High risk medication use 11/19/2018  . History of bilateral carpal tunnel release 11/19/2018  . Autoimmune disease ([place]) 10/08/2018  . Carpal tunnel syndrome, right upper limb 09/26/2018  . Radicular pain in right arm 09/16/2018  . History of pregnancy induced hypertension 08/03/2014  . History of pre-eclampsia 08/03/2014  . Chronic back pain 01/14/2014  . Allergic rhinitis 01/14/2014   Medications:  Saleemah A. Bukowski had no medications administered during this visit. Current Outpatient Medications  Medication Sig Dispense Refill  . Cholecalciferol (VITAMIN D) 50 MCG (2000 UT) CAPS Take by mouth daily.    Marland Kitchen FLUoxetine (PROZAC) 20 MG capsule daily.    Marland Kitchen gabapentin (NEURONTIN) 300 MG capsule Take 1 tablet by mouth twice daily, then 600 mg at bedtime.    . hydrocortisone 2.5 % cream APP EXT AA BID FOR FACIAL RASH    .  hydroxychloroquine (PLAQUENIL) 200 MG tablet TAKE 1 TABLET BY MOUTH TWICE DAILY ON MONDAY THROUGH FRIDAY 120 tablet 0  . pantoprazole (PROTONIX) 40 MG tablet Take 1 tablet (40 mg total) by mouth daily before breakfast. 90 tablet 3  . valACYclovir (VALTREX) 1000 MG tablet Take 500 mg by mouth as needed (outbreak).     . valACYclovir (VALTREX) 500 MG tablet Take 1 tablet (500 mg total) by mouth 2 (two) times daily. 60 tablet 1  . benzocaine-Menthol (DERMOPLAST) 20-0.5 % AERO Apply 1 application topically 4 (four) times daily as needed for irritation. 56 g 0  . fluocinonide gel (LIDEX) 0.05 % as needed.    Marland Kitchen LEVONORGESTREL IU 1 each by Intrauterine route once.      No current facility-administered medications for this visit.     Allergies: has No Known Allergies.  Physical Exam:  BP 112/73   Pulse 72   Wt 130 lb (59 kg)   BMI 24.56 kg/m  Body mass index is 24.56 kg/m. General appearance: Well nourished, well developed female in no acute distress.  Neuro/Psych:  Normal mood and affect.    Pelvic exam:  EGBUS: moderate erythema and irritation on b/l vulva extending circumferentially.   Vaginal vault: no blood, white and clear d/c Cervix: normal, nttp, difficult to visualize   Assessment: recurrent yeast and BV.   Plan:  1. Vaginal discharge See below - Candida 6 Species Profile, NAA  2. Vaginal yeast infection No e/o hsv outbreak. Will await tx on yeast swab. Will need  BV tx at least.  - Candida 6 Species Profile, NAA   RTC: PRN. Call pt with results.   Durene Romans MD Attending Center for [employer] manager)

## 2019-06-23 NOTE — Progress Notes (Signed)
Vaginal discharge for several weeks.

## 2019-06-24 ENCOUNTER — Other Ambulatory Visit: Payer: Self-pay | Admitting: *Deleted

## 2019-06-25 ENCOUNTER — Encounter: Payer: Self-pay | Admitting: Rheumatology

## 2019-06-25 DIAGNOSIS — R200 Anesthesia of skin: Secondary | ICD-10-CM

## 2019-06-25 NOTE — Telephone Encounter (Signed)
Please place a referral to neurology for evaluation.

## 2019-07-01 NOTE — Progress Notes (Signed)
Office Visit Note  Patient: Evelyn [name]             Date of Birth: 04-Apr-1986           MRN: [id number]             PCP: Nickola Major, MD Referring: Nickola Major, MD Visit Date: 07/15/2019 Occupation: '@GUAROCC'$ @  Subjective:  Generalized pain   History of Present Illness: Evelyn [name] is a 33 y.o. female with history of systemic lupus erythematosus and fibromyalgia.  She takes Plaquenil 200 mg 1 tablet by mouth twice daily Monday through Friday.  She has not missed any doses of Plaquenil recently.  She states that she was diagnosed with COVID at the end of June and started feeling better after several days.  She states that she did have loss of taste and smell and worsening fatigue at that time.  She continues to have chronic fatigue.  She has been sleeping better since taking gabapentin.  She states the gabapentin does not seem to help with her pain.  She continues to take Cymbalta 90 mg by mouth daily.  She has been having increased pain and stiffness in bilateral hands.  She states that she has intermittent swelling in her hands.  She is also been having right shoulder joint pain.  She had a right shoulder cortisone injection on 10/13/2018.  She continues have generalized muscle aches and muscle tenderness due to fibromyalgia.  She states that her sicca symptoms have improved.  She states her hair loss is also improved.  She states she continues to get intermittent rashes scattered on her skin.  She denies any rashes currently.  She continues to have recurrent nasal ulcerations but denies any oral ulcerations.  She has intermittent symptoms of Raynaud's.    Activities of Daily Living:  Patient reports morning stiffness for 1-2 hours.   Patient Reports nocturnal pain.  Difficulty dressing/grooming: Reports Difficulty climbing stairs: Reports Difficulty getting out of chair: Denies Difficulty using hands for taps, buttons, cutlery, and/or writing: Reports  Review  of Systems  Constitutional: Positive for fatigue.  HENT: Positive for mouth sores, mouth dryness and nose dryness.   Eyes: Positive for pain, itching and dryness. Negative for visual disturbance.  Respiratory: Negative for cough, hemoptysis, shortness of breath, wheezing and difficulty breathing.   Cardiovascular: Negative for chest pain, palpitations, hypertension and swelling in legs/feet.  Gastrointestinal: Positive for abdominal pain. Negative for blood in stool, constipation and diarrhea.  Endocrine: Negative for increased urination.  Genitourinary: Negative for painful urination.  Musculoskeletal: Positive for arthralgias, joint pain, joint swelling and morning stiffness. Negative for myalgias, muscle weakness, muscle tenderness and myalgias.  Skin: Positive for rash. Negative for color change, pallor, hair loss, nodules/bumps, skin tightness, ulcers and sensitivity to sunlight.  Allergic/Immunologic: Negative for susceptible to infections.  Neurological: Negative for dizziness, numbness, headaches and memory loss.  Hematological: Positive for bruising/bleeding tendency. Negative for swollen glands.  Psychiatric/Behavioral: Negative for depressed mood, confusion and sleep disturbance. The patient is not nervous/anxious.     PMFS History:  Patient Active Problem List   Diagnosis Date Noted   Pain of left side of body 07/02/2019   Paresthesia 07/02/2019   Vulvovaginal candidiasis 06/23/2019   Gastroesophageal reflux disease 01/27/2019   Systemic lupus erythematosus ([place]) 11/19/2018   Raynaud's disease without gangrene 11/19/2018   High risk medication use 11/19/2018   History of bilateral carpal tunnel release 11/19/2018   Autoimmune disease ([place]) 10/08/2018  Carpal tunnel syndrome, right upper limb 09/26/2018   Radicular pain in right arm 09/16/2018   History of pregnancy induced hypertension 08/03/2014   History of pre-eclampsia 08/03/2014   Chronic back pain  01/14/2014   Allergic rhinitis 01/14/2014    Past Medical History:  Diagnosis Date   Allergy    Depression    post partum   GERD (gastroesophageal reflux disease)    HELLP syndrome    2015   HSV (herpes simplex virus) infection    Numbness    Systemic lupus erythematosus ([place])    Umbilical hernia    Vaginal delivery 2004, 2009, 2011    Family History  Problem Relation Age of Onset   Healthy Mother    Healthy Sister    Healthy Brother    Asthma Son    Healthy Son    Healthy Son    Healthy Son    Other Father        never met her father - unsure of his history   Healthy Daughter    Heart disease Neg Hx    Neurologic Disorder Neg Hx    Colon cancer Neg Hx    Esophageal cancer Neg Hx    Past Surgical History:  Procedure Laterality Date   CARPAL TUNNEL RELEASE Right 09/26/2018   Procedure: RIGHT CARPAL TUNNEL RELEASE;  Surgeon: Leandrew Koyanagi, MD;  Location: [facility];  Service: Orthopedics;  Laterality: Right;   CARPAL TUNNEL RELEASE Left 2017   CESAREAN SECTION N/A 08/05/2014   Procedure: CESAREAN SECTION;  Surgeon: Jerelyn Charles, MD;  Location: [facility];  Service: Obstetrics;  Laterality: N/A;   KNEE SURGERY Left 2000   LAPAROSCOPIC BILATERAL SALPINGECTOMY N/A 12/13/2015   Procedure: LAPAROSCOPIC BILATERAL SALPINGECTOMY;  Surgeon: Emily Filbert, MD;  Location: [facility];  Service: Gynecology;  Laterality: N/A;   PERINEOPLASTY N/A 02/20/2016   Procedure: PERINEOPLASTY;  Surgeon: Emily Filbert, MD;  Location: [facility];  Service: Gynecology;  Laterality: N/A;   PUBOVAGINAL SLING N/A 12/13/2015   Procedure: Gaynelle Arabian;  Surgeon: Emily Filbert, MD;  Location: [facility];  Service: Gynecology;  Laterality: N/A;   RECTOCELE REPAIR N/A 02/20/2016   Procedure: POSTERIOR REPAIR (RECTOCELE);  Surgeon: Emily Filbert, MD;  Location: [facility];  Service: Gynecology;  Laterality: N/A;   UMBILICAL HERNIA REPAIR  2011   Social History   Social History  Narrative   Originally from [place], graduated [employer]   Married.  3 sons one daughter, birth years 2004, 2009, 2011, 2015    Pt does not exercise.   She works part-time at [employer] when she can   Highest level of education: 12th grade   No alcohol tobacco or drug use   1 coffee daily   Right-handed.   Immunization History  Administered Date(s) Administered   Influenza,inj,Quad PF,6+ Mos 08/11/2014, 09/07/2016, 11/04/2017, 09/24/2018   Pneumococcal Conjugate-13 10/20/2018   Tdap 08/06/2014     Objective: Vital Signs: BP 104/65 (BP Location: Left Arm, Patient Position: Sitting, Cuff Size: Normal)    Pulse 72    Resp 13    Ht '5\' 1"'$  (1.549 m)    Wt 132 lb 3.2 oz (60 kg)    BMI 24.98 kg/m    Physical Exam Vitals signs and nursing note reviewed.  Constitutional:      Appearance: She is well-developed.  HENT:     Head: Normocephalic and atraumatic.  Eyes:     Conjunctiva/sclera: Conjunctivae normal.  Neck:     Musculoskeletal: Normal range of motion.  Cardiovascular:     Rate and Rhythm: Normal rate and regular rhythm.     Heart sounds: Normal heart sounds.  Pulmonary:     Effort: Pulmonary effort is normal.     Breath sounds: Normal breath sounds.  Abdominal:     General: Bowel sounds are normal.     Palpations: Abdomen is soft.  Lymphadenopathy:     Cervical: No cervical adenopathy.  Skin:    General: Skin is warm and dry.     Capillary Refill: Capillary refill takes less than 2 seconds.     Comments: No malar rash No digital ulcerations or signs of gangrene   Neurological:     Mental Status: She is alert and oriented to person, place, and time.  Psychiatric:        Behavior: Behavior normal.      Musculoskeletal Exam: C-spine, thoracic spine, and lumbar spine good ROM.  No midline spinal tenderness.  No SI joint tenderness.  Shoulder joints, elbow joints, wrist joints, MCPs, PIPs, and DIPs good ROM with no synovitis.  Complete fist formation  bilaterally.  Hip joints, knee joints, ankle joints, MTPs, PIPs, and Dips good ROM with no synovitis.  No warmth or effusion of knee joints.  No tenderness or swelling of ankle joints.    CDAI Exam: CDAI Score: -- Patient Global: --; Provider Global: -- Swollen: --; Tender: -- Joint Exam   No joint exam has been documented for this visit   There is currently no information documented on the homunculus. Go to the Rheumatology activity and complete the homunculus joint exam.  Investigation: No additional findings.  Imaging: No results found.  Recent Labs: Lab Results  Component Value Date   WBC 4.2 04/10/2019   HGB 13.6 04/10/2019   PLT 325 04/10/2019   NA 139 04/10/2019   K 3.8 04/10/2019   CL 102 04/10/2019   CO2 27 04/10/2019   GLUCOSE 81 04/10/2019   BUN 11 04/10/2019   CREATININE 0.79 04/10/2019   BILITOT 0.6 04/10/2019   ALKPHOS 119 02/19/2016   AST 21 04/10/2019   ALT 14 04/10/2019   PROT 8.0 04/10/2019   ALBUMIN 4.7 02/19/2016   CALCIUM 10.3 (H) 04/10/2019   GFRAA 116 04/10/2019   QFTBGOLDPLUS NEGATIVE 10/06/2018    Speciality Comments: PLQ Eye Exam: 11/11/18 WNL @ [facility] follow up in 1 year  Procedures:  No procedures performed Allergies: Patient has no known allergies.   Assessment / Plan:     Visit Diagnoses: Other systemic lupus erythematosus with other organ involvement (HCC) - Positive ANA, positive SSA, history of oral ulcers, nasal ulcers, lymphadenopathy, fatigue, weight loss, facial rash, photosensitivity,Raynauds phenomenon: She has not had any signs or symptoms of a lupus flare.  She is clinically doing well on Plaquenil 200 mg 1 tablet by mouth twice daily Monday through Friday.  Her fatigue and myalgias seem to be related to fibromyalgia.  She has no synovitis on exam.  She has been having increased pain and stiffness in both hands but has no tenderness or inflammation on exam.  She has intermittent rashes scattered on her skin.   No rash was noted on exam today.  Her hair loss has improved recently.  Her sicca symptoms have also been improving.  She continues to have recurrent nasal ulcerations but denies any oral ulcerations.  She continues to have intermittent symptoms of Raynaud's but no digital ulcerations or signs  of gangrene were noted.  She will continue taking Plaquenil 200 mg 1 tablet twice daily Monday through Friday.  A refill of Plaquenil was sent to the pharmacy today.  Future orders for autoimmune lab work were placed today and will be due in October.  She was advised to notify us if he develops new or worsening symptoms.  She will follow-up in the office in 5 months.- Plan: hydroxychloroquine (PLAQUENIL) 200 MG tablet  High risk medication use - PLQ 200 mg p.o. twice daily Monday through Friday.  Eye exam: 11/11/2018.  CBC and CMP were drawn on 04/10/2019.  She will return for lab work in October and every 5 months to monitor for drug toxicity.  Raynaud's disease without gangrene-She continues to have intermittent symptoms of Raynaud's.  No digital ulcerations or signs of gangrene were noted.  We discussed the importance of keeping her core body temperature warm.  Fibromyalgia - She has generalized hyperalgesia and positive tender points on exam.  She continues have generalized muscle aches muscle tenderness due to fibromyalgia.  She is taking gabapentin as prescribed and Cymbalta 90 mg by mouth daily.  She reports that the gabapentin helps her sleep at night but has not been helping with her pain.  She continues to have chronic fatigue related to insomnia.  Good sleep hygiene was discussed.  We discussed importance of staying active and exercising regularly.  Chronic midline low back pain without sciatica - She has no midline lower back pain at this time.  She has no symptoms of radiculopathy or sciatica.  History of bilateral carpal tunnel release - Dr. Erlinda Hong -She is asymptomatic.  Blood in stool - She was evaluated  by Dr. Carlean Purl in the past.  Orders: Orders Placed This Encounter  Procedures   COMPLETE METABOLIC PANEL WITH GFR   CBC with Differential/Platelet   Urinalysis, Routine w reflex microscopic   Anti-DNA antibody, double-stranded   C3 and C4   Sedimentation rate   Meds ordered this encounter  Medications   hydroxychloroquine (PLAQUENIL) 200 MG tablet    Sig: Take 1 tablet by mouth twice daily Monday through Friday only.    Dispense:  120 tablet    Refill:  0    Face-to-face time spent with patient was 30 minutes. Greater than 50% of time was spent in counseling and coordination of care.  Follow-Up Instructions: Return in about 5 months (around 12/15/2019) for Systemic lupus erythematosus, Fibromyalgia.   Ofilia Neas, PA-C  I examined and evaluated the patient with Hazel Sams PA.  Patient continues to have some generalized discomfort and fatigue.  She had no synovitis on my examination.  She had 2 areas on her extremities which she was concerned about which were small lipomas.  The plan of care was discussed as noted above.  Bo Merino, MD Note - This record has been created using Editor, commissioning.  Chart creation errors have been sought, but may not always  have been located. Such creation errors do not reflect on  the standard of medical care.

## 2019-07-02 ENCOUNTER — Ambulatory Visit: Payer: Medicaid Other | Admitting: Neurology

## 2019-07-02 ENCOUNTER — Encounter: Payer: Self-pay | Admitting: Neurology

## 2019-07-02 ENCOUNTER — Other Ambulatory Visit: Payer: Self-pay

## 2019-07-02 VITALS — BP 113/68 | HR 88 | Temp 99.0°F | Ht 61.0 in | Wt 133.0 lb

## 2019-07-02 DIAGNOSIS — R52 Pain, unspecified: Secondary | ICD-10-CM | POA: Insufficient documentation

## 2019-07-02 DIAGNOSIS — R202 Paresthesia of skin: Secondary | ICD-10-CM | POA: Diagnosis not present

## 2019-07-02 LAB — CANDIDA 6 SPECIES PROFILE, NAA
C PARAPSILOSIS/TROPICALIS: NEGATIVE
Candida albicans, NAA: POSITIVE — AB
Candida glabrata, NAA: NEGATIVE
Candida krusei, NAA: NEGATIVE
Candida lusitaniae, NAA: NEGATIVE

## 2019-07-02 NOTE — Progress Notes (Signed)
PATIENT: Evelyn [name] DOB: [birth year]  Chief Complaint  Patient presents with  . Numbness/Twitch    Reports numbness in her left leg and arm for the last week.  She had a previous episode on her right side in October 2019.  She went to the ED for evaluation in October.  She has also developed an intermittent twitch in her right eye.   . Rheumatology    Bo Merino, MD (referring MD)  . PCP    Nickola Major, MD     HISTORICAL  Evelyn [name] is a 33 year old female, seen in request by her rheumatologist Dr. Estanislado Pandy, Abel Presto, for evaluation of intermittent left side paresthesia, difficulty, her primary care physician is Dr.Eksir, Aldona Bar A, initial evaluation was on July 02, 2019.  I have reviewed and summarized the referring note from the referring physician.  She had a past medical history of systemic lupus, was treated since November 2019 with Plaquenil along with prednisone tapering, which has helped her joint pain, also complains of stiffness around her neck intermittent blurry vision, She also had a history of right carpal tunnel release surgery on September 26, 2018,  She was seen by orthopedic surgeon in October 2019 for evaluation of neck pain, right arm pain, also complains of intermittent numbness involving right arm, lower extremity, lasting for few months, had extensive evaluations, personally reviewed MRI of brain without contrast September 08, 2018, that was normal, MRI of cervical, thoracic spine showed no significant abnormalities.  CMP  Laboratory evaluations in May 2020,Normal CBC hemoglobin of 13.6, normal C3, 4, double-stranded antibody, elevated SSA antibody, normal CPK, TSH, B12, ANA positive with titer of 1-640, homogenous nuclear patterns, elevated ESR 25,  Her right body paresthesias intermittent difficulty last gradually improved, she began to have left side since beginning of July 2020, intermittent numbness involving left arm, left leg,  sometimes feel her left leg give out underneath her, intermittent right eye muscle twitching, there was no significant limitation of her daily function  REVIEW OF SYSTEMS: Full 14 system review of systems performed and notable only for as above All other review of systems were negative.  ALLERGIES: No Known Allergies  HOME MEDICATIONS: Current Outpatient Medications  Medication Sig Dispense Refill  . benzocaine-Menthol (DERMOPLAST) 20-0.5 % AERO Apply 1 application topically 4 (four) times daily as needed for irritation. 56 g 0  . Cholecalciferol (VITAMIN D) 50 MCG (2000 UT) CAPS Take by mouth daily.    . fluocinonide gel (LIDEX) 0.05 % as needed.    Marland Kitchen FLUoxetine (PROZAC) 20 MG capsule daily.    Marland Kitchen gabapentin (NEURONTIN) 300 MG capsule Take 1 tablet by mouth twice daily, then 600 mg at bedtime.    . hydrocortisone 2.5 % cream APP EXT AA BID FOR FACIAL RASH    . hydroxychloroquine (PLAQUENIL) 200 MG tablet TAKE 1 TABLET BY MOUTH TWICE DAILY ON MONDAY THROUGH FRIDAY 120 tablet 0  . LEVONORGESTREL IU 1 each by Intrauterine route once.     . pantoprazole (PROTONIX) 40 MG tablet Take 1 tablet (40 mg total) by mouth daily before breakfast. 90 tablet 3  . valACYclovir (VALTREX) 1000 MG tablet Take 500 mg by mouth as needed (outbreak).     . valACYclovir (VALTREX) 500 MG tablet TAKE 1 TABLET(500 MG) BY MOUTH TWICE DAILY 60 tablet 1   No current facility-administered medications for this visit.     PAST MEDICAL HISTORY: Past Medical History:  Diagnosis Date  . Allergy   . Depression  post partum  . GERD (gastroesophageal reflux disease)   . HELLP syndrome    2015  . HSV (herpes simplex virus) infection   . Numbness   . Systemic lupus erythematosus ([place])   . Umbilical hernia   . Vaginal delivery 2004, 2009, 2011    PAST SURGICAL HISTORY: Past Surgical History:  Procedure Laterality Date  . CARPAL TUNNEL RELEASE Right 09/26/2018   Procedure: RIGHT CARPAL TUNNEL RELEASE;  Surgeon:  Leandrew Koyanagi, MD;  Location: [facility];  Service: Orthopedics;  Laterality: Right;  . CARPAL TUNNEL RELEASE Left 2017  . CESAREAN SECTION N/A 08/05/2014   Procedure: CESAREAN SECTION;  Surgeon: Jerelyn Charles, MD;  Location: [facility];  Service: Obstetrics;  Laterality: N/A;  . KNEE SURGERY Left 2000  . LAPAROSCOPIC BILATERAL SALPINGECTOMY N/A 12/13/2015   Procedure: LAPAROSCOPIC BILATERAL SALPINGECTOMY;  Surgeon: Emily Filbert, MD;  Location: [facility];  Service: Gynecology;  Laterality: N/A;  . PERINEOPLASTY N/A 02/20/2016   Procedure: PERINEOPLASTY;  Surgeon: Emily Filbert, MD;  Location: [facility];  Service: Gynecology;  Laterality: N/A;  . PUBOVAGINAL SLING N/A 12/13/2015   Procedure: Gaynelle Arabian;  Surgeon: Emily Filbert, MD;  Location: [facility];  Service: Gynecology;  Laterality: N/A;  . RECTOCELE REPAIR N/A 02/20/2016   Procedure: POSTERIOR REPAIR (RECTOCELE);  Surgeon: Emily Filbert, MD;  Location: [facility];  Service: Gynecology;  Laterality: N/A;  . UMBILICAL HERNIA REPAIR  2011    FAMILY HISTORY: Family History  Problem Relation Age of Onset  . Healthy Mother   . Healthy Sister   . Healthy Brother   . Asthma Son   . Healthy Son   . Healthy Son   . Healthy Son   . Other Father        never met her father - unsure of his history  . Healthy Daughter   . Heart disease Neg Hx   . Neurologic Disorder Neg Hx   . Colon cancer Neg Hx   . Esophageal cancer Neg Hx     SOCIAL HISTORY: Social History   Socioeconomic History  . Marital status: Married    Spouse name: Not on file  . Number of children: 4  . Years of education: 23  . Highest education level: High school graduate  Occupational History  . Occupation: Magazine features editor - part-time  Social Needs  . Financial resource strain: Not on file  . Food insecurity    Worry: Not on file    Inability: Not on file  . Transportation needs    Medical: Not on file    Non-medical: Not on file  Tobacco Use  . Smoking  status: Never Smoker  . Smokeless tobacco: Never Used  Substance and Sexual Activity  . Alcohol use: Not Currently  . Drug use: No  . Sexual activity: Yes    Partners: Male    Birth control/protection: Surgical, I.U.D.  Lifestyle  . Physical activity    Days per week: Not on file    Minutes per session: Not on file  . Stress: Not on file  Relationships  . Social Herbalist on phone: Not on file    Gets together: Not on file    Attends religious service: Not on file    Active member of club or organization: Not on file    Attends meetings of clubs or organizations: Not on file    Relationship status: Not on file  . Intimate partner violence  Fear of current or ex partner: Not on file    Emotionally abused: Not on file    Physically abused: Not on file    Forced sexual activity: Not on file  Other Topics Concern  . Not on file  Social History Narrative   Originally from [place], graduated [employer]   Married.  3 sons one daughter, birth years 2004, 2009, 2011, 2015    Pt does not exercise.   She works part-time at [employer] when she can   Highest level of education: 12th grade   No alcohol tobacco or drug use   1 coffee daily   Right-handed.     PHYSICAL EXAM   Vitals:   07/02/19 1125  BP: 113/68  Pulse: 88  Temp: 99 F (37.2 C)  Weight: 133 lb (60.3 kg)  Height: '5\' 1"'$  (1.549 m)    Not recorded      Body mass index is 25.13 kg/m.  PHYSICAL EXAMNIATION:  Gen: NAD, conversant, well nourised, obese, well groomed                     Cardiovascular: Regular rate rhythm, no peripheral edema, warm, nontender. Eyes: Conjunctivae clear without exudates or hemorrhage Neck: Supple, no carotid bruits. Pulmonary: Clear to auscultation bilaterally   NEUROLOGICAL EXAM:  MENTAL STATUS: Speech:    Speech is normal; fluent and spontaneous with normal comprehension.  Cognition:     Orientation to time, place and person     Normal recent  and remote memory     Normal Attention span and concentration     Normal Language, naming, repeating,spontaneous speech     Fund of knowledge   CRANIAL NERVES: CN II: Visual fields are full to confrontation.  Pupils are round equal and briskly reactive to light. CN III, IV, VI: extraocular movement are normal. No ptosis. CN V: Facial sensation is intact to pinprick in all 3 divisions bilaterally. Corneal responses are intact.  CN VII: Face is symmetric with normal eye closure and smile. CN VIII: Hearing is normal to rubbing fingers CN IX, X: Palate elevates symmetrically. Phonation is normal. CN XI: Head turning and shoulder shrug are intact CN XII: Tongue is midline with normal movements and no atrophy.  MOTOR: There is no pronator drift of out-stretched arms. Muscle bulk and tone are normal. Muscle strength is normal.  REFLEXES: Reflexes are 2+ and symmetric at the biceps, triceps, knees, and ankles. Plantar responses are flexor.  SENSORY: Intact to light touch, pinprick, positional sensation and vibratory sensation are intact in fingers and toes.  COORDINATION: Rapid alternating movements and fine finger movements are intact. There is no dysmetria on finger-to-nose and heel-knee-shin.    GAIT/STANCE: Posture is normal. Gait is steady with normal steps, base, arm swing, and turning. Heel and toe walking are normal. Tandem gait is normal.  Romberg is absent.   DIAGNOSTIC DATA (LABS, IMAGING, TESTING) - I reviewed patient records, labs, notes, testing and imaging myself where available.   ASSESSMENT AND PLAN  Domitila Shauntee Karp is a 33 y.o. female   Intermittent left arm, left leg paresthesia  Essentially normal neurological examination, well-preserved motor strength, deep tendon reflex, and sensory at both upper and lower extremities,   No evidence of peripheral nerve disease, inflammatory muscle disease, CPK was normal in November 2019   Extensive MRI of the brain,  cervical, thoracic spine was also normal in October 2019   Continue Cymbalta 90 mg daily  Continue  follow-up with her rheumatologist    Marcial Pacas, M.D. Ph.D.  [facility] [address], [place], [place] [address] Ph: [phone] Fax: [phone]  CC: Referring Provider

## 2019-07-08 MED ORDER — ITRACONAZOLE 200 MG PO TABS: ORAL_TABLET | ORAL | 2 refills | Status: DC

## 2019-07-08 NOTE — Addendum Note (Signed)
Addended by: Aletha Halim on: 07/08/2019 02:12 PM   Modules accepted: Orders

## 2019-07-13 ENCOUNTER — Telehealth: Payer: Self-pay | Admitting: *Deleted

## 2019-07-13 NOTE — Telephone Encounter (Signed)
Called pt to inform her that her Onmel tablets where denied and dermoplast is not covered by her insurance as well. Informed that I made Dr Ilda Basset aware and will call her back if he changes her meds.

## 2019-07-15 ENCOUNTER — Ambulatory Visit: Payer: Medicaid Other | Admitting: Rheumatology

## 2019-07-15 ENCOUNTER — Other Ambulatory Visit: Payer: Self-pay | Admitting: Physician Assistant

## 2019-07-15 ENCOUNTER — Encounter: Payer: Self-pay | Admitting: Rheumatology

## 2019-07-15 ENCOUNTER — Other Ambulatory Visit: Payer: Self-pay

## 2019-07-15 VITALS — BP 104/65 | HR 72 | Resp 13 | Ht 61.0 in | Wt 132.2 lb

## 2019-07-15 DIAGNOSIS — M545 Low back pain, unspecified: Secondary | ICD-10-CM

## 2019-07-15 DIAGNOSIS — Z79899 Other long term (current) drug therapy: Secondary | ICD-10-CM

## 2019-07-15 DIAGNOSIS — M797 Fibromyalgia: Secondary | ICD-10-CM

## 2019-07-15 DIAGNOSIS — I7300 Raynaud's syndrome without gangrene: Secondary | ICD-10-CM | POA: Diagnosis not present

## 2019-07-15 DIAGNOSIS — G8929 Other chronic pain: Secondary | ICD-10-CM

## 2019-07-15 DIAGNOSIS — M3219 Other organ or system involvement in systemic lupus erythematosus: Secondary | ICD-10-CM

## 2019-07-15 DIAGNOSIS — Z9889 Other specified postprocedural states: Secondary | ICD-10-CM

## 2019-07-15 DIAGNOSIS — K921 Melena: Secondary | ICD-10-CM

## 2019-07-15 MED ORDER — HYDROXYCHLOROQUINE SULFATE 200 MG PO TABS: ORAL_TABLET | ORAL | 0 refills | Status: DC

## 2019-07-15 NOTE — Patient Instructions (Signed)
Standing Labs We placed an order today for your standing lab work.    Please come back and get your standing labs in October and every 5 months   We have open lab daily Monday through Thursday from 8:30-12:30 PM and 1:30-4:30 PM and Friday from 8:30-12:30 PM and 1:30 -4:00 PM at the office of Dr. Shaili Deveshwar.   You may experience shorter wait times on Monday and Friday afternoons. The office is located at [address], [address], [place], [place] [address] No appointment is necessary.   Labs are drawn by [facility].  You may receive a bill from [facility] for your lab work.  If you wish to have your labs drawn at another location, please call the office 24 hours in advance to send orders.  If you have any questions regarding directions or hours of operation,  please call [phone].   Just as a reminder please drink plenty of water prior to coming for your lab work. Thanks!   

## 2019-07-16 ENCOUNTER — Telehealth: Payer: Self-pay | Admitting: *Deleted

## 2019-07-16 ENCOUNTER — Other Ambulatory Visit: Payer: Self-pay | Admitting: Obstetrics and Gynecology

## 2019-07-16 MED ORDER — CLOTRIMAZOLE 1 % VA CREA: 1.0000 | TOPICAL_CREAM | Freq: Every day | VAGINAL | 0 refills | Status: AC

## 2019-07-16 NOTE — Telephone Encounter (Signed)
Called Evelyn [name] to inform her that Dr Ilda Basset sent another medication to try and if its helps great, and if not to call us back and we will try to get the other cream approved. Evelyn [name] verbalizes and understands

## 2019-09-10 ENCOUNTER — Other Ambulatory Visit: Payer: Self-pay | Admitting: Physician Assistant

## 2019-09-10 DIAGNOSIS — M3219 Other organ or system involvement in systemic lupus erythematosus: Secondary | ICD-10-CM

## 2019-09-13 IMAGING — MR MR THORACIC SPINE W/O CM
6 of 8 series · 29 of 48 positions shown · IV contrast (gadavist)
Comparison: Cervical spine radiographs 09/05/2018

CLINICAL DATA: Right neck, shoulder, and arm pain since last week.
Left leg numbness. Right eye blurred vision and headache.

EXAM:
MRI HEAD WITHOUT CONTRAST
MRI CERVICAL SPINE WITHOUT AND WITH CONTRAST
MRI THORACIC SPINE WITHOUT CONTRAST
TECHNIQUE: Multiplanar, multiecho pulse sequences of the brain and thoracic
spine were obtained without intravenous contrast. Multiplanar,
multiecho pulse sequences of the cervical spine, to include the
craniocervical junction and cervicothoracic junction, were obtained
without and with intravenous contrast.
CONTRAST:  5 mL Gadavist

[Series 5: T2 · sagittal · 3.0mm · 0.69mm/px · 3 of 15 slices shown (1 of 2)]
[im 1/15]
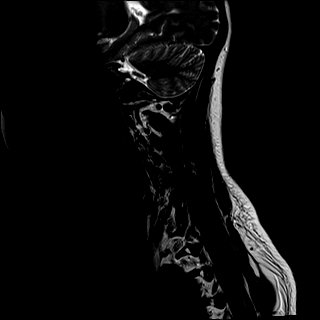
[im 8/15]
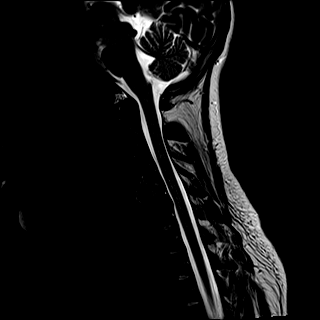
[im 15/15]
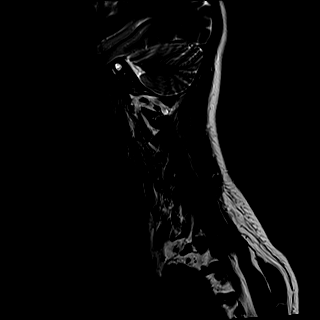

[Series 6: T1 · sagittal · 3.0mm · 0.69mm/px · 3 of 15 slices shown (1 of 2)]
[im 1/15]
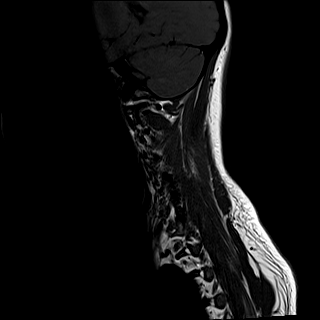
[im 8/15]
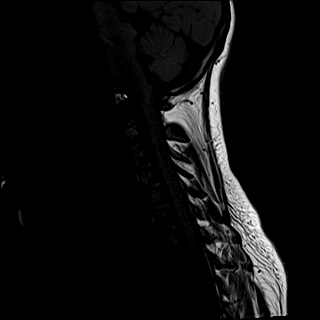
[im 15/15]
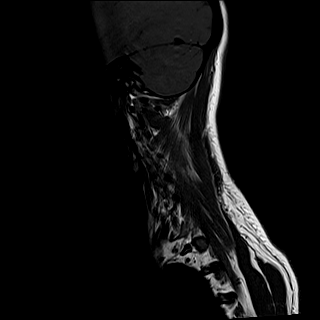

[Series 7: STIR · sagittal · 3.0mm · 0.86mm/px · 2 of 15 slices shown]
[im 1/15]
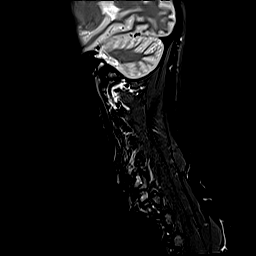
[im 8/15]
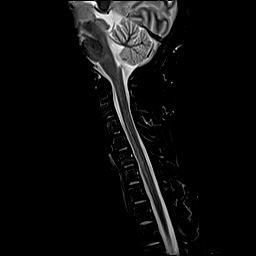

[Series 8: T2 · axial · 3.0mm · 0.66mm/px · z∈[-213,-92]mm · 9 of 40 slices shown (2 of 2)]
[im 1/40]
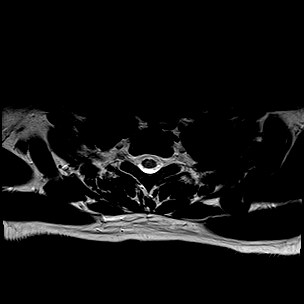
[im 5/40]
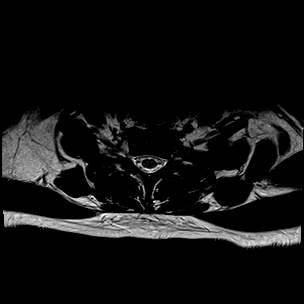
[im 10/40]
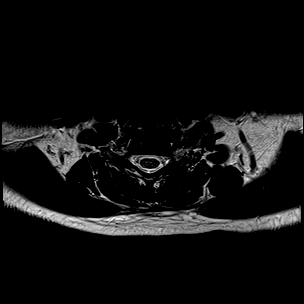
[im 15/40]
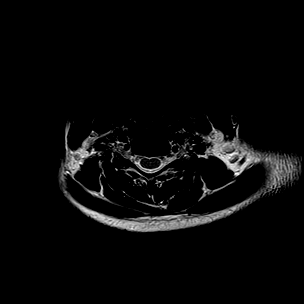
[im 20/40]
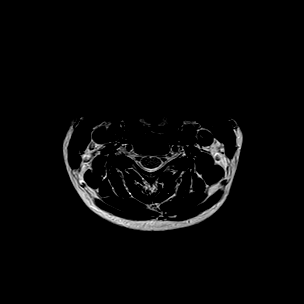
[im 25/40]
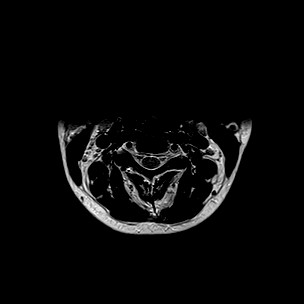
[im 30/40]
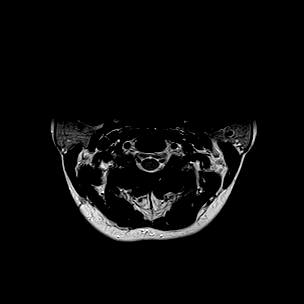
[im 35/40]
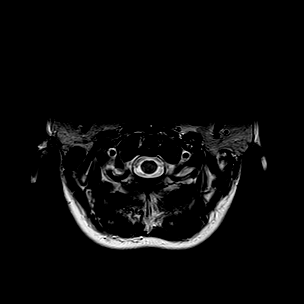
[im 40/40]
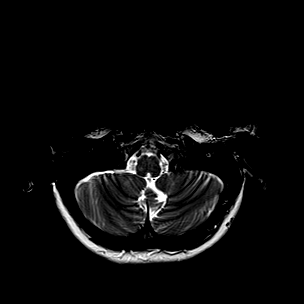

[Series 10: T1 · axial · 3.0mm · 0.39mm/px · z∈[-213,-92]mm · 9 of 40 slices shown (2 of 2)]
[im 1/40]
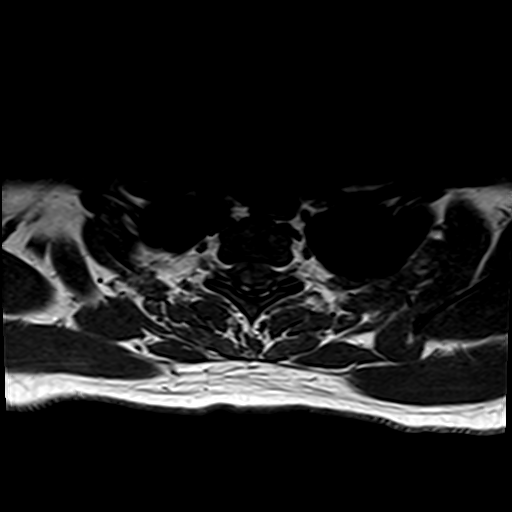
[im 5/40]
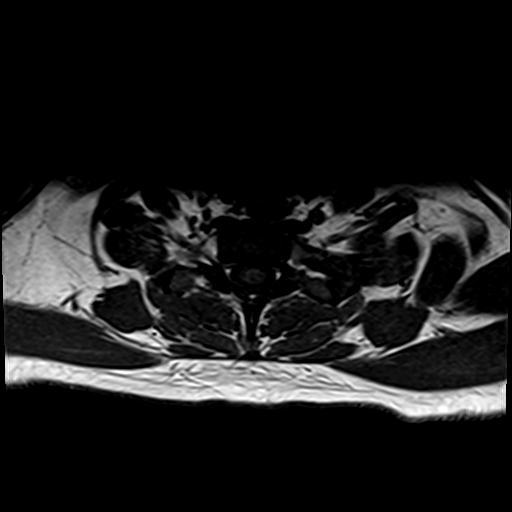
[im 10/40]
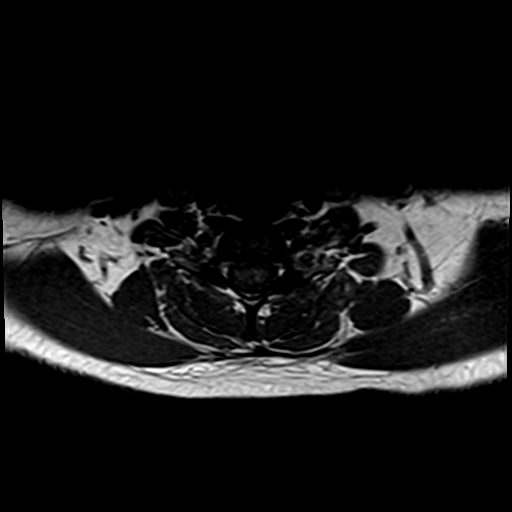
[im 15/40]
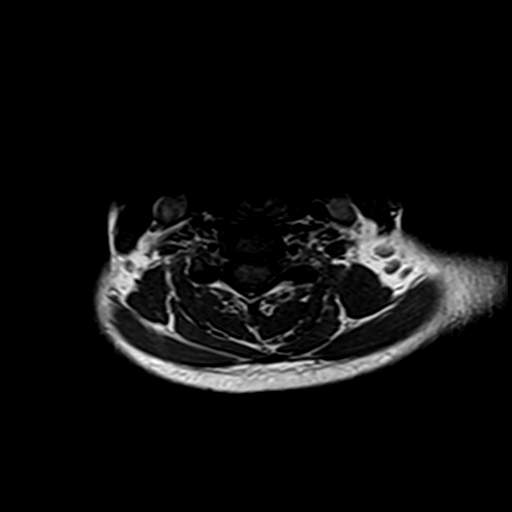
[im 20/40]
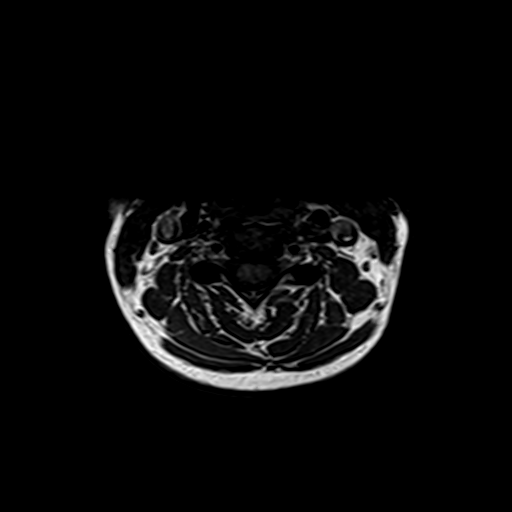
[im 25/40]
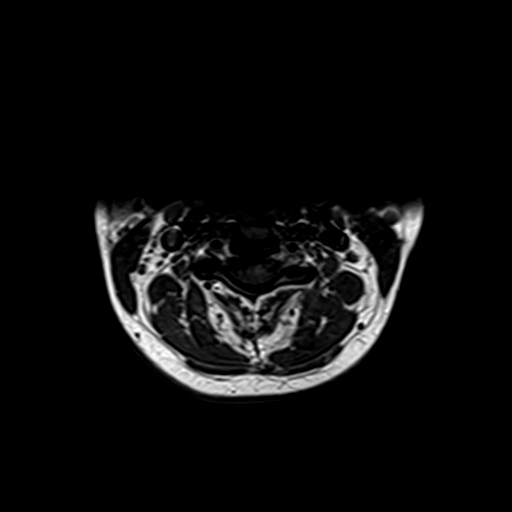
[im 30/40]
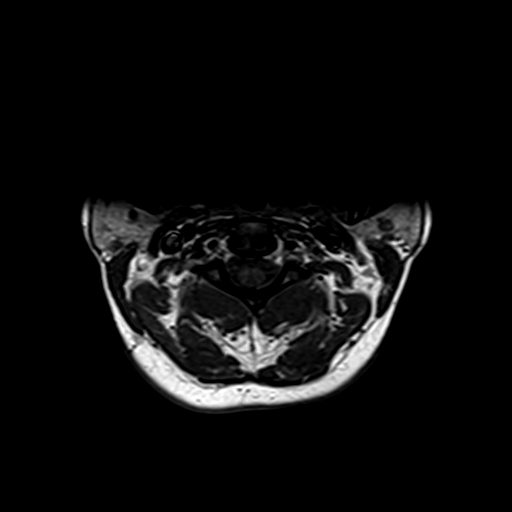
[im 35/40]
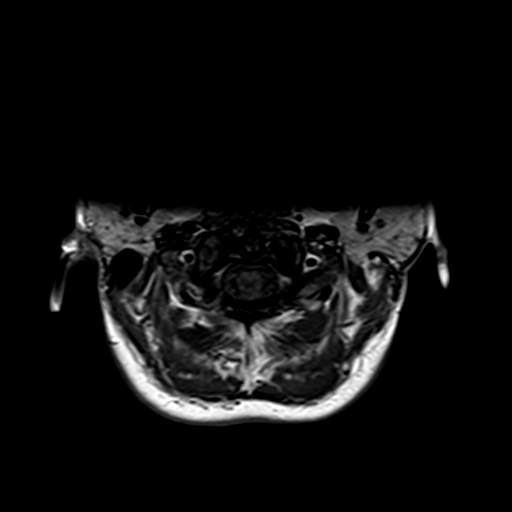
[im 40/40]
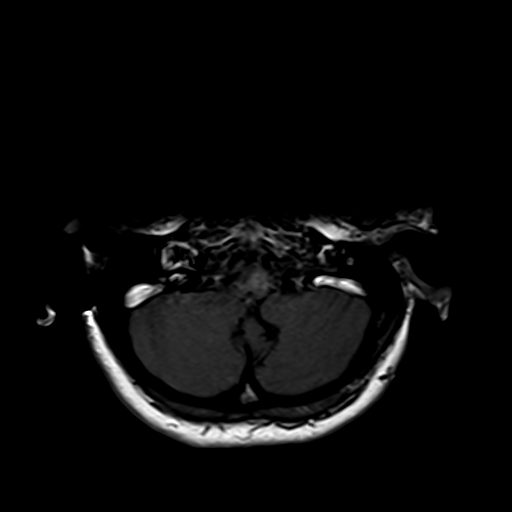

[Series 11: T1 fat-sat post-contrast · sagittal · 3.0mm · 0.43mm/px · 3 of 15 slices shown]
[im 1/15]
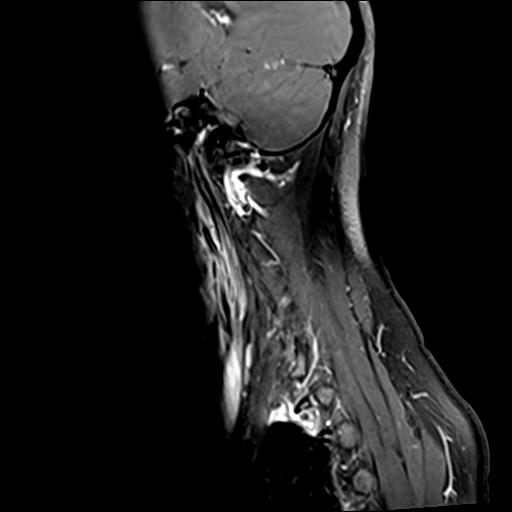
[im 8/15]
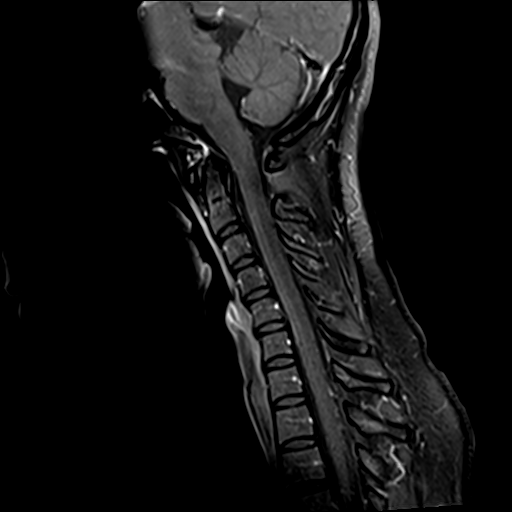
[im 15/15]
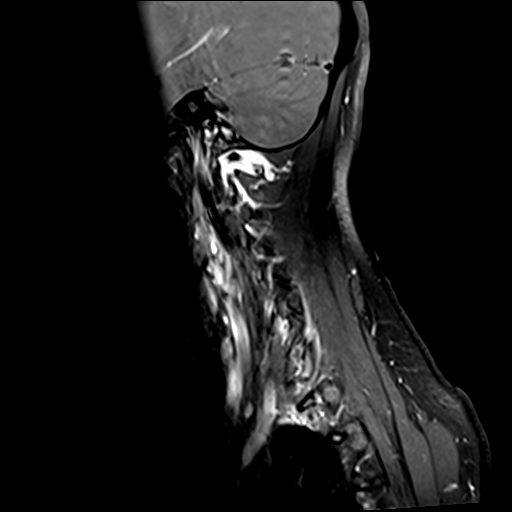

[29 of 48 positions shown; findings below may reference images not displayed]

FINDINGS: MRI HEAD FINDINGS

Brain: There is no evidence of acute infarct, intracranial
hemorrhage, mass, midline shift, or extra-axial fluid collection.
The ventricles and sulci are normal. The brain is normal in signal.

Vascular: Major intracranial vascular flow voids are preserved.

Skull and upper cervical spine: Unremarkable calvarial bone marrow
signal.

Sinuses/Orbits: Unremarkable orbits. Paranasal sinuses and mastoid
air cells are clear.

Other: None.

MRI CERVICAL SPINE FINDINGS

Alignment: Cervical spine straightening.  No listhesis.

Vertebrae: No fracture, suspicious osseous lesion, or significant
marrow edema. Mildly diminished bone marrow T1 signal intensity
diffusely, nonspecific though can be seen with anemia and smoking.

Cord: No abnormal normal cord signal and morphology. No abnormal
intradural enhancement.

Posterior Fossa, vertebral arteries, paraspinal tissues:
Unremarkable.

Disc levels: Intervertebral disc space heights are preserved. There
is a diminutive central disc protrusion at C5-6 without stenosis or
significant spinal cord mass effect. The other disc levels are
unremarkable.

MRI THORACIC SPINE FINDINGS

Alignment:  Normal.

Vertebrae: No fracture, suspicious osseous lesion, or significant
marrow edema. Mildly diminished bone marrow T1 signal intensity
diffusely as described above.

Cord:  Normal signal and morphology.

Paraspinal and other soft tissues: Unremarkable.

Disc levels: Disc height and hydration are preserved throughout the
thoracic spine. No disc herniation is identified, and the spinal
canal and neural foramina are widely patent.
IMPRESSION: 1. Negative brain MRI.
2. Tiny C5-6 central disc protrusion without stenosis.
3. Negative thoracic spine MRI.

## 2019-09-16 ENCOUNTER — Encounter: Payer: Self-pay | Admitting: Radiology

## 2019-10-23 ENCOUNTER — Other Ambulatory Visit: Payer: Self-pay

## 2019-10-23 DIAGNOSIS — M3219 Other organ or system involvement in systemic lupus erythematosus: Secondary | ICD-10-CM

## 2019-10-23 DIAGNOSIS — Z79899 Other long term (current) drug therapy: Secondary | ICD-10-CM

## 2019-10-26 ENCOUNTER — Other Ambulatory Visit: Payer: Self-pay | Admitting: *Deleted

## 2019-10-26 DIAGNOSIS — M3219 Other organ or system involvement in systemic lupus erythematosus: Secondary | ICD-10-CM

## 2019-10-26 LAB — URINALYSIS, ROUTINE W REFLEX MICROSCOPIC
Bilirubin Urine: NEGATIVE
Glucose, UA: NEGATIVE
Hgb urine dipstick: NEGATIVE
Ketones, ur: NEGATIVE
Leukocytes,Ua: NEGATIVE
Nitrite: NEGATIVE
Protein, ur: NEGATIVE
Specific Gravity, Urine: 1.016 (ref 1.001–1.03)
pH: 7.5 (ref 5.0–8.0)

## 2019-10-26 LAB — COMPLETE METABOLIC PANEL WITH GFR
AG Ratio: 1.7 (calc) (ref 1.0–2.5)
ALT: 15 U/L (ref 6–29)
AST: 19 U/L (ref 10–30)
Albumin: 4.6 g/dL (ref 3.6–5.1)
Alkaline phosphatase (APISO): 81 U/L (ref 31–125)
BUN: 11 mg/dL (ref 7–25)
CO2: 26 mmol/L (ref 20–32)
Calcium: 9.8 mg/dL (ref 8.6–10.2)
Chloride: 103 mmol/L (ref 98–110)
Creat: 0.76 mg/dL (ref 0.50–1.10)
GFR, Est African American: 120 mL/min/{1.73_m2} (ref >=60)
GFR, Est Non African American: 104 mL/min/{1.73_m2} (ref >=60)
Globulin: 2.7 g/dL (calc) (ref 1.9–3.7)
Glucose, Bld: 79 mg/dL (ref 65–99)
Potassium: 4.1 mmol/L (ref 3.5–5.3)
Sodium: 138 mmol/L (ref 135–146)
Total Bilirubin: 0.4 mg/dL (ref 0.2–1.2)
Total Protein: 7.3 g/dL (ref 6.1–8.1)

## 2019-10-26 LAB — CBC WITH DIFFERENTIAL/PLATELET
Absolute Monocytes: 582 cells/uL (ref 200–950)
Basophils Absolute: 42 cells/uL (ref 0–200)
Basophils Relative: 0.7 %
Eosinophils Absolute: 78 cells/uL (ref 15–500)
Eosinophils Relative: 1.3 %
HCT: 37.7 % (ref 35.0–45.0)
Hemoglobin: 12.7 g/dL (ref 11.7–15.5)
Lymphs Abs: 1632 cells/uL (ref 850–3900)
MCH: 30.8 pg (ref 27.0–33.0)
MCHC: 33.7 g/dL (ref 32.0–36.0)
MCV: 91.5 fL (ref 80.0–100.0)
MPV: 10.5 fL (ref 7.5–12.5)
Monocytes Relative: 9.7 %
Neutro Abs: 3666 cells/uL (ref 1500–7800)
Neutrophils Relative %: 61.1 %
Platelets: 345 10*3/uL (ref 140–400)
RBC: 4.12 10*6/uL (ref 3.80–5.10)
RDW: 11.8 % (ref 11.0–15.0)
Total Lymphocyte: 27.2 %
WBC: 6 10*3/uL (ref 3.8–10.8)

## 2019-10-26 LAB — SEDIMENTATION RATE: Sed Rate: 9 mm/h (ref 0–20)

## 2019-10-26 LAB — C3 AND C4
C3 Complement: 110 mg/dL (ref 83–193)
C4 Complement: 23 mg/dL (ref 15–57)

## 2019-10-26 LAB — ANTI-DNA ANTIBODY, DOUBLE-STRANDED: ds DNA Ab: 2 IU/mL

## 2019-10-26 MED ORDER — HYDROXYCHLOROQUINE SULFATE 200 MG PO TABS: ORAL_TABLET | ORAL | 0 refills | Status: AC

## 2019-10-26 NOTE — Progress Notes (Signed)
CBC and CMP WNL.  Complements WNL.  Sed rate WNL.  UA normal.

## 2019-10-27 NOTE — Progress Notes (Signed)
DsDNA negative.

## 2019-11-23 NOTE — Progress Notes (Signed)
[facility] Note  11/24/2019     CHIEF COMPLAINT Patient presents for Retina Evaluation   HISTORY OF PRESENT ILLNESS: Evelyn [name] is a 33 y.o. female who presents to the clinic today for:   HPI    Retina Evaluation    In both eyes.  This started 1 day ago.  Duration of 1 day.  Associated Symptoms Flashes and Floaters.  Context:  distance vision.  I, the attending physician,  performed the HPI with the patient and updated documentation appropriately.          Comments    Pt states her vision is worse in her right eye than her left.  She has occasional floaters and flashes of light in both eyes.  She denies eye pain or discomfort.       Last edited by Bernarda Caffey, MD on 11/24/2019  8:49 AM. (History)    pt is here on the referral of Dr. Wyatt Portela for possible lattice degeneration, pt states she was referred to Dr. Katy Fitch by her rheumatologist bc she is on plaquenil for lupus, pt states she sees flashes and floaters in both eyes occasionally, but otherwise has no vision problems  Referring physician: Debbra Riding, MD [address] [place],  [place] [address]  HISTORICAL INFORMATION:   Selected notes from the [facility] Referred by Dr. Zenia Resides for eval of lattice holes OU.   CURRENT MEDICATIONS: Current Outpatient Medications (Ophthalmic Drugs)  Medication Sig  . prednisoLONE acetate (PRED FORTE) 1 % ophthalmic suspension Place 1 drop into the right eye 4 (four) times daily for 7 days.   No current facility-administered medications for this visit. (Ophthalmic Drugs)   Current Outpatient Medications (Other)  Medication Sig  . Cholecalciferol (VITAMIN D) 50 MCG (2000 UT) CAPS Take by mouth daily.  . DULoxetine (CYMBALTA) 30 MG capsule TAKE 3 CAPSULES BY MOUTH EVERY DAY FOR PAIN OR DEPRESSION  . gabapentin (NEURONTIN) 300 MG capsule Take 1 tablet by mouth twice daily, then 4 capsules at bedtime.  . hydrocortisone  2.5 % cream APP EXT AA BID FOR FACIAL RASH  . hydroxychloroquine (PLAQUENIL) 200 MG tablet Take 1 tablet by mouth twice daily Monday through Friday only.  Marland Kitchen LEVONORGESTREL IU 1 each by Intrauterine route once.   . pantoprazole (PROTONIX) 40 MG tablet Take 1 tablet (40 mg total) by mouth daily before breakfast.  . valACYclovir (VALTREX) 500 MG tablet TAKE 1 TABLET(500 MG) BY MOUTH TWICE DAILY (Patient taking differently: as needed. )   No current facility-administered medications for this visit. (Other)      REVIEW OF SYSTEMS: ROS    Positive for: Eyes   Negative for: Constitutional, Gastrointestinal, Neurological, Skin, Genitourinary, Musculoskeletal, HENT, Endocrine, Cardiovascular, Respiratory, Psychiatric, Allergic/Imm, Heme/Lymph   Last edited by Doneen Poisson on 11/24/2019  8:15 AM. (History)       ALLERGIES No Known Allergies  PAST MEDICAL HISTORY Past Medical History:  Diagnosis Date  . Allergy   . Depression    post partum  . GERD (gastroesophageal reflux disease)   . HELLP syndrome    2015  . HSV (herpes simplex virus) infection   . Numbness   . Systemic lupus erythematosus ([place])   . Umbilical hernia   . Vaginal delivery 2004, 2009, 2011   Past Surgical History:  Procedure Laterality Date  . CARPAL TUNNEL RELEASE Right 09/26/2018   Procedure: RIGHT CARPAL TUNNEL RELEASE;  Surgeon: Frankey Shown  M, MD;  Location: [facility];  Service: Orthopedics;  Laterality: Right;  . CARPAL TUNNEL RELEASE Left 2017  . CESAREAN SECTION N/A 08/05/2014   Procedure: CESAREAN SECTION;  Surgeon: Jerelyn Charles, MD;  Location: [facility];  Service: Obstetrics;  Laterality: N/A;  . KNEE SURGERY Left 2000  . LAPAROSCOPIC BILATERAL SALPINGECTOMY N/A 12/13/2015   Procedure: LAPAROSCOPIC BILATERAL SALPINGECTOMY;  Surgeon: Emily Filbert, MD;  Location: [facility];  Service: Gynecology;  Laterality: N/A;  . PERINEOPLASTY N/A 02/20/2016   Procedure: PERINEOPLASTY;  Surgeon: Emily Filbert,  MD;  Location: [facility];  Service: Gynecology;  Laterality: N/A;  . PUBOVAGINAL SLING N/A 12/13/2015   Procedure: Gaynelle Arabian;  Surgeon: Emily Filbert, MD;  Location: [facility];  Service: Gynecology;  Laterality: N/A;  . RECTOCELE REPAIR N/A 02/20/2016   Procedure: POSTERIOR REPAIR (RECTOCELE);  Surgeon: Emily Filbert, MD;  Location: [facility];  Service: Gynecology;  Laterality: N/A;  . UMBILICAL HERNIA REPAIR  2011    FAMILY HISTORY Family History  Problem Relation Age of Onset  . Healthy Mother   . Healthy Sister   . Healthy Brother   . Asthma Son   . Healthy Son   . Healthy Son   . Healthy Son   . Other Father        never met her father - unsure of his history  . Healthy Daughter   . Heart disease Neg Hx   . Neurologic Disorder Neg Hx   . Colon cancer Neg Hx   . Esophageal cancer Neg Hx     SOCIAL HISTORY Social History   Tobacco Use  . Smoking status: Never Smoker  . Smokeless tobacco: Never Used  Substance Use Topics  . Alcohol use: Not Currently  . Drug use: No         OPHTHALMIC EXAM:  Base Eye Exam    Visual Acuity (Snellen - Linear)      Right Left   Dist cc 20/25 -1 20/20 -1   Dist ph cc 20/20 -2    Correction: Glasses       Tonometry (Tonopen, 8:24 AM)      Right Left   Pressure 19 19       Pupils      Dark Light Shape React APD   Right 4 3 Round Brisk 0   Left 4 3 Round Brisk 0       Visual Fields      Left Right    Full Full       Extraocular Movement      Right Left    Full Full       Neuro/Psych    Oriented x3: Yes   Mood/Affect: Normal       Dilation    Both eyes: 1.0% Mydriacyl, 2.5% Phenylephrine @ 8:24 AM        Slit Lamp and Fundus Exam    Slit Lamp Exam      Right Left   Lids/Lashes Normal Normal   Conjunctiva/Sclera White and quiet White and quiet   Cornea Trace Punctate epithelial erosions Trace Punctate epithelial erosions   Anterior Chamber Deep and quiet Deep and quiet   Iris Round and dilated Round and  dilated   Lens Clear Clear   Vitreous Trace Vitreous syneresis, no cell Trace Vitreous syneresis, no cell       Fundus Exam      Right Left   Disc Pink and Sharp Pink and  Sharp   C/D Ratio 0.3 0.3   Macula Flat, Good foveal reflex, No heme or edema Flat, Good foveal reflex, No heme or edema   Vessels Normal Normal   Periphery Attached, temporal WWP, flap tear at 0930 ora, small patch of lattice with atrophic holes at 1000, mild patch of lattice at 0600 very anterior micro tear at 0700 ora Attached, mild patch of pigmented lattice at 0430, patch extending from 0500-0600, focal patch at 0730 equator        Refraction    Wearing Rx      Sphere Cylinder Axis   Right -3.00 +1.25 180   Left -3.00 +1.50 170       Manifest Refraction      Sphere Cylinder Axis Dist VA   Right -3.00 +1.00 180 20/20-2   Left -3.50 +1.50 170 20/20          IMAGING AND PROCEDURES  Imaging and Procedures for '@TODAY'$ @  OCT, Retina - OU - Both Eyes       Right Eye Quality was good. Central Foveal Thickness: 279. Progression has no prior data. Findings include normal foveal contour, no IRF, no SRF, vitreomacular adhesion .   Left Eye Quality was good. Central Foveal Thickness: 276. Progression has no prior data. Findings include normal foveal contour, no IRF, no SRF, vitreomacular adhesion .   Notes *Images captured and stored on drive  Diagnosis / Impression:  NFP, no IRF/SRF OU VMA OU No plaquenil toxicity OU   Clinical management:  See below  Abbreviations: NFP - Normal foveal profile. CME - cystoid macular edema. PED - pigment epithelial detachment. IRF - intraretinal fluid. SRF - subretinal fluid. EZ - ellipsoid zone. ERM - epiretinal membrane. ORA - outer retinal atrophy. ORT - outer retinal tubulation. SRHM - subretinal hyper-reflective material        Repair Retinal Breaks, Laser - OD - Right Eye       LASER PROCEDURE NOTE  Procedure:  Barrier laser retinopexy using laser  indirect ophthalmoscope, RIGHT eye   Diagnosis:   Retinal tears and lattice degeneration, RIGHT eye                     Tears at 0700 and 0930 o'clock anterior to equator; lattice at 0600 and 1000  Surgeon: Bernarda Caffey, MD, PhD  Anesthesia: Topical  Informed consent obtained, operative eye marked, and time out performed prior to initiation of laser.   Laser settings:  Lumenis [id number] laser indirect ophthalmoscope Power: 250 mW Duration: 50 msec  # spots: 532  Placement of laser: Laser was placed in three confluent rows all tears and patches of lattice as outlined above with additional rows anteriorly.  Complications: None.  Patient tolerated the procedure well and received written and verbal post-procedure care information/education.                 ASSESSMENT/PLAN:    ICD-10-CM   1. Bilateral retinal lattice degeneration  H35.413 Repair Retinal Breaks, Laser - OD - Right Eye  2. Bilateral retinal defect  H33.303 Repair Retinal Breaks, Laser - OD - Right Eye  3. Retinal edema  H35.81 OCT, Retina - OU - Both Eyes  4. Myopia of both eyes with astigmatism  H52.13    H52.203   5. Long-term use of Plaquenil  Z79.899   6. Other systemic lupus erythematosus with other organ involvement ([place])  M32.19    1,2. Lattice degeneration w/ retinal defects, both eyes  - right  eye: flap tear at 0930 ora, small patch of pigmented lattice with atrophic holes at 1000, mild patch at 0600, very anterior micro tear at 0700 ora  - left eye: mild patch of pigmented lattice at 0430, patch from 0500-0600, focal patch at 0730 equator  - discussed findings, prognosis, and treatment options including observation  - recommend laser retinopexy right eye today, 12.22.20  - pt wishes to proceed with laser OD  - RBA of procedure discussed, questions answered  - informed consent obtained and signed  - see procedure note  - start PF QID right eye x7 days  - f/u week of January 11, POV right eye;  laser retinopexy left eye  3. No retinal edema on exam or OCT  4. Myopia with astigmatism OU  5,6. SLE on Plaquenil  - currently taking 200 mg BID (400 mg daily)  - no retinal toxicity noted on exam or OCT today  - pt's wt is 60 kg  - 400/60 = 6.67 mg/kg/day  - the AAO recommends daily dosing of < 5.0 mg/kg for HCQ  - recommend reduction of plaquenil dosing to < 5.0 mg/kg/day or switching to another agent for SLE  **300 mg daily would be 5 mg/kg/day**   Ophthalmic Meds Ordered this visit:  Meds ordered this encounter  Medications  . prednisoLONE acetate (PRED FORTE) 1 % ophthalmic suspension    Sig: Place 1 drop into the right eye 4 (four) times daily for 7 days.    Dispense:  10 mL    Refill:  0       Return for f/u week of January 11 POV OD, laser retinopexy OS.  There are no Patient Instructions on file for this visit.   Explained the diagnoses, plan, and follow up with the patient and they expressed understanding.  Patient expressed understanding of the importance of proper follow up care.   This document serves as a record of services personally performed by Gardiner Sleeper, MD, PhD. It was created on their behalf by Estill Bakes, COT an ophthalmic technician. The creation of this record is the provider's dictation and/or activities during the visit.    Electronically signed by: Estill Bakes, COT 11/23/19 @ 5:34 PM   This document serves as a record of services personally performed by Gardiner Sleeper, MD, PhD. It was created on their behalf by Ernest Mallick, OA, an ophthalmic assistant. The creation of this record is the provider's dictation and/or activities during the visit.    Electronically signed by: Ernest Mallick, OA 12.22.2020 5:34 PM  Gardiner Sleeper, M.D., Ph.D. Diseases & Surgery of the Retina and [facility] 11/24/2019   I have reviewed the above documentation for accuracy and completeness, and I agree with the above.  Gardiner Sleeper, M.D., Ph.D. 11/24/19 5:34 PM    Abbreviations: M myopia (nearsighted); A astigmatism; H hyperopia (farsighted); P presbyopia; Mrx spectacle prescription;  CTL contact lenses; OD right eye; OS left eye; OU both eyes  XT exotropia; ET esotropia; PEK punctate epithelial keratitis; PEE punctate epithelial erosions; DES dry eye syndrome; MGD meibomian gland dysfunction; ATs artificial tears; PFAT's preservative free artificial tears; [place] nuclear sclerotic cataract; PSC posterior subcapsular cataract; ERM epi-retinal membrane; PVD posterior vitreous detachment; RD retinal detachment; DM diabetes mellitus; DR diabetic retinopathy; NPDR non-proliferative diabetic retinopathy; PDR proliferative diabetic retinopathy; CSME clinically significant macular edema; DME diabetic macular edema; dbh dot blot hemorrhages; CWS cotton wool spot; POAG primary open angle glaucoma; C/D  cup-to-disc ratio; HVF humphrey visual field; GVF goldmann visual field; OCT optical coherence tomography; IOP intraocular pressure; BRVO Branch retinal vein occlusion; CRVO central retinal vein occlusion; CRAO central retinal artery occlusion; BRAO branch retinal artery occlusion; RT retinal tear; SB scleral buckle; PPV pars plana vitrectomy; VH Vitreous hemorrhage; PRP panretinal laser photocoagulation; IVK intravitreal kenalog; VMT vitreomacular traction; MH Macular hole;  NVD neovascularization of the disc; NVE neovascularization elsewhere; AREDS age related eye disease study; ARMD age related macular degeneration; POAG primary open angle glaucoma; EBMD epithelial/anterior basement membrane dystrophy; ACIOL anterior chamber intraocular lens; IOL intraocular lens; PCIOL posterior chamber intraocular lens; Phaco/IOL phacoemulsification with intraocular lens placement; [place] photorefractive keratectomy; LASIK laser assisted in situ keratomileusis; HTN hypertension; DM diabetes mellitus; COPD chronic obstructive pulmonary disease

## 2019-11-24 ENCOUNTER — Encounter (INDEPENDENT_AMBULATORY_CARE_PROVIDER_SITE_OTHER): Payer: Self-pay | Admitting: Ophthalmology

## 2019-11-24 ENCOUNTER — Other Ambulatory Visit: Payer: Self-pay

## 2019-11-24 ENCOUNTER — Ambulatory Visit (INDEPENDENT_AMBULATORY_CARE_PROVIDER_SITE_OTHER): Payer: Medicaid Other | Admitting: Ophthalmology

## 2019-11-24 DIAGNOSIS — H3581 Retinal edema: Secondary | ICD-10-CM

## 2019-11-24 DIAGNOSIS — H33303 Unspecified retinal break, bilateral: Secondary | ICD-10-CM | POA: Diagnosis not present

## 2019-11-24 DIAGNOSIS — H35413 Lattice degeneration of retina, bilateral: Secondary | ICD-10-CM

## 2019-11-24 DIAGNOSIS — H5213 Myopia, bilateral: Secondary | ICD-10-CM | POA: Diagnosis not present

## 2019-11-24 DIAGNOSIS — Z79899 Other long term (current) drug therapy: Secondary | ICD-10-CM

## 2019-11-24 DIAGNOSIS — H52203 Unspecified astigmatism, bilateral: Secondary | ICD-10-CM

## 2019-11-24 DIAGNOSIS — M3219 Other organ or system involvement in systemic lupus erythematosus: Secondary | ICD-10-CM

## 2019-11-24 MED ORDER — PREDNISOLONE ACETATE 1 % OP SUSP: 1.0000 [drp] | Freq: Four times a day (QID) | OPHTHALMIC | 0 refills | Status: AC

## 2019-12-11 NOTE — Progress Notes (Signed)
[facility] Note  12/15/2019     CHIEF COMPLAINT Patient presents for Retina Follow Up   HISTORY OF PRESENT ILLNESS: Evelyn [name] is a 34 y.o. female who presents to the clinic today for:   HPI    Retina Follow Up    Patient presents with  Other.  In both eyes.  This started 3 weeks ago.  Severity is mild.  Duration of 3 weeks.  Since onset it is stable.  I, the attending physician,  performed the HPI with the patient and updated documentation appropriately.          Comments    34 y/o female pt here for 3 wk f/u s/p laser retinopexy OD 12.22.20, and here for laser retinopexy OS today.  No change in [place] OU.  Denies pain, flashes, floaters.  No gtts.  Has new glasses.  Rx from Dr. Katy Fitch.       Last edited by Bernarda Caffey, MD on 12/15/2019  9:43 AM. (History)     Patient states no change in vision. No floaters or flashes.   Referring physician: Nickola Major, MD [address] [place],  [place] [address]  HISTORICAL INFORMATION:   Selected notes from the MEDICAL RECORD NUMBER Referred by Dr. Zenia Resides for eval of lattice holes OU.   CURRENT MEDICATIONS: No current outpatient medications on file. (Ophthalmic Drugs)   No current facility-administered medications for this visit. (Ophthalmic Drugs)   Current Outpatient Medications (Other)  Medication Sig  . Cholecalciferol (VITAMIN D) 50 MCG (2000 UT) CAPS Take by mouth daily.  . hydrocortisone 2.5 % cream APP EXT AA BID FOR FACIAL RASH  . hydroxychloroquine (PLAQUENIL) 200 MG tablet Take 1 tablet by mouth twice daily Monday through Friday only.  . lansoprazole (PREVACID) 30 MG capsule   . LEVONORGESTREL IU 1 each by Intrauterine route once.   . polyethylene glycol powder (MIRALAX) 17 GM/SCOOP powder Miralax 17 gram/dose oral powder  Take 17 g every day by oral route.  . pregabalin (LYRICA) 50 MG capsule Take by mouth.  . triamcinolone (KENALOG) 0.1 % paste Place onto teeth.  .  valACYclovir (VALTREX) 500 MG tablet TAKE 1 TABLET(500 MG) BY MOUTH TWICE DAILY (Patient taking differently: as needed. )  . venlafaxine XR (EFFEXOR-XR) 150 MG 24 hr capsule TAKE 1 CAPSULE(150 MG) BY MOUTH DAILY  . DULoxetine (CYMBALTA) 30 MG capsule TAKE 3 CAPSULES BY MOUTH EVERY DAY FOR PAIN OR DEPRESSION  . gabapentin (NEURONTIN) 300 MG capsule Take 1 tablet by mouth twice daily, then 4 capsules at bedtime.  . pantoprazole (PROTONIX) 40 MG tablet Take 1 tablet (40 mg total) by mouth daily before breakfast.   No current facility-administered medications for this visit. (Other)      REVIEW OF SYSTEMS: ROS    Positive for: Gastrointestinal, Eyes   Negative for: Constitutional, Neurological, Skin, Genitourinary, Musculoskeletal, HENT, Endocrine, Cardiovascular, Respiratory, Psychiatric, Allergic/Imm, Heme/Lymph   Last edited by Matthew Folks, COA on 12/15/2019  9:14 AM. (History)       ALLERGIES No Known Allergies  PAST MEDICAL HISTORY Past Medical History:  Diagnosis Date  . Allergy   . Depression    post partum  . GERD (gastroesophageal reflux disease)   . HELLP syndrome    2015  . HSV (herpes simplex virus) infection   . Numbness   . Systemic lupus erythematosus ([place])   . Umbilical hernia   . Vaginal delivery 2004, 2009, 2011  Past Surgical History:  Procedure Laterality Date  . CARPAL TUNNEL RELEASE Right 09/26/2018   Procedure: RIGHT CARPAL TUNNEL RELEASE;  Surgeon: Leandrew Koyanagi, MD;  Location: [facility];  Service: Orthopedics;  Laterality: Right;  . CARPAL TUNNEL RELEASE Left 2017  . CESAREAN SECTION N/A 08/05/2014   Procedure: CESAREAN SECTION;  Surgeon: Jerelyn Charles, MD;  Location: [facility];  Service: Obstetrics;  Laterality: N/A;  . KNEE SURGERY Left 2000  . LAPAROSCOPIC BILATERAL SALPINGECTOMY N/A 12/13/2015   Procedure: LAPAROSCOPIC BILATERAL SALPINGECTOMY;  Surgeon: Emily Filbert, MD;  Location: [facility];  Service: Gynecology;  Laterality: N/A;  .  PERINEOPLASTY N/A 02/20/2016   Procedure: PERINEOPLASTY;  Surgeon: Emily Filbert, MD;  Location: [facility];  Service: Gynecology;  Laterality: N/A;  . PUBOVAGINAL SLING N/A 12/13/2015   Procedure: Gaynelle Arabian;  Surgeon: Emily Filbert, MD;  Location: [facility];  Service: Gynecology;  Laterality: N/A;  . RECTOCELE REPAIR N/A 02/20/2016   Procedure: POSTERIOR REPAIR (RECTOCELE);  Surgeon: Emily Filbert, MD;  Location: [facility];  Service: Gynecology;  Laterality: N/A;  . UMBILICAL HERNIA REPAIR  2011    FAMILY HISTORY Family History  Problem Relation Age of Onset  . Healthy Mother   . Healthy Sister   . Healthy Brother   . Asthma Son   . Healthy Son   . Healthy Son   . Healthy Son   . Other Father        never met her father - unsure of his history  . Healthy Daughter   . Heart disease Neg Hx   . Neurologic Disorder Neg Hx   . Colon cancer Neg Hx   . Esophageal cancer Neg Hx     SOCIAL HISTORY Social History   Tobacco Use  . Smoking status: Never Smoker  . Smokeless tobacco: Never Used  Substance Use Topics  . Alcohol use: Not Currently  . Drug use: No         OPHTHALMIC EXAM:  Base Eye Exam    Visual Acuity (Snellen - Linear)      Right Left   Dist cc 20/20 20/15 -2   Correction: Glasses       Tonometry (Tonopen, 9:16 AM)      Right Left   Pressure 17 18       Pupils      Dark Light Shape React APD   Right 4 3 Round Brisk None   Left 4 3 Round Brisk None       Visual Fields (Counting fingers)      Left Right    Full Full       Extraocular Movement      Right Left    Full, Ortho Full, Ortho       Neuro/Psych    Oriented x3: Yes   Mood/Affect: Normal       Dilation    Both eyes: 1.0% Mydriacyl, 2.5% Phenylephrine @ 9:16 AM        Slit Lamp and Fundus Exam    Slit Lamp Exam      Right Left   Lids/Lashes Normal Normal   Conjunctiva/Sclera White and quiet White and quiet   Cornea Trace Punctate epithelial erosions Trace Punctate epithelial erosions    Anterior Chamber Deep and quiet Deep and quiet   Iris Round and dilated Round and dilated   Lens Clear Clear   Vitreous Trace Vitreous syneresis, no cell Trace Vitreous syneresis, no cell  Fundus Exam      Right Left   Disc Pink and Sharp Pink and Sharp   C/D Ratio 0.3 0.3   Macula Flat, Good foveal reflex, No heme or edema Flat, Good foveal reflex, No heme or edema   Vessels Normal Normal   Periphery Attached, temporal WWP, flap tear at 0930 ora, small patch of lattice with atrophic holes at 1000--good laser changes surrounding, mild patch of lattice at 0600 very anterior micro tear at 0700 ora--light laser changes surrounding  Attached, mild patch of pigmented lattice at 0430, peripheral patch of lattice extending from 0500-0730, focal pigmented patch at 0730 equator        Refraction    Wearing Rx      Sphere Cylinder Axis   Right -4.00 +1.00 103   Left -3.75 +1.00 073   Age: 68 wks   Type: SVL          IMAGING AND PROCEDURES  Imaging and Procedures for '@TODAY'$ @  OCT, Retina - OU - Both Eyes       Right Eye Quality was good. Central Foveal Thickness: 279. Progression has been stable. Findings include normal foveal contour, no IRF, no SRF, vitreomacular adhesion .   Left Eye Quality was good. Central Foveal Thickness: 272. Progression has been stable. Findings include normal foveal contour, no IRF, no SRF, vitreomacular adhesion .   Notes *Images captured and stored on drive  Diagnosis / Impression:  NFP, no IRF/SRF OU--stable VMA OU No plaquenil toxicity OU   Clinical management:  See below  Abbreviations: NFP - Normal foveal profile. CME - cystoid macular edema. PED - pigment epithelial detachment. IRF - intraretinal fluid. SRF - subretinal fluid. EZ - ellipsoid zone. ERM - epiretinal membrane. ORA - outer retinal atrophy. ORT - outer retinal tubulation. SRHM - subretinal hyper-reflective material        Repair Retinal Breaks, Laser - OS - Left Eye        LASER PROCEDURE NOTE  Procedure:  Barrier laser retinopexy using slit lamp laser, LEFT eye   Diagnosis:   Lattice degeneration w/ retinal defects, LEFT eye                     mild patch of pigmented lattice at 0430, peripheral patch of lattice extending from 0500-0730, focal pigmented patch at 0730 equator  Surgeon: Bernarda Caffey, MD, PhD  Anesthesia: Topical  Informed consent obtained, operative eye marked, and time out performed prior to initiation of laser.   Laser settings:  Lumenis Smart532 laser, slit lamp Lens: [place] PRP 165 Power: 260 mW Spot size: 200 microns Duration: 30 msec  # spots: 498  Placement of laser: Using a [place]ster PRP 165 contact lens at the slit lamp, laser was placed in three confluent rows around patches of lattice w/ atrophic holes as described above with additional rows anteriorly.  Complications: None.  Patient tolerated the procedure well and received written and verbal post-procedure care information/education.                 ASSESSMENT/PLAN:    ICD-10-CM   1. Bilateral retinal lattice degeneration  H35.413 Repair Retinal Breaks, Laser - OS - Left Eye  2. Bilateral retinal defect  H33.303 Repair Retinal Breaks, Laser - OS - Left Eye  3. Retinal edema  H35.81 OCT, Retina - OU - Both Eyes  4. Myopia of both eyes with astigmatism  H52.13    H52.203   5. Long-term use of Plaquenil  Z79.899   6. Other systemic lupus erythematosus with other organ involvement ([place])  M32.19    1,2. Lattice degeneration w/ retinal defects, both eyes  - right eye: flap tear at 0930 ora, small patch of pigmented lattice with atrophic holes at 1000, mild patch at 0600, very anterior micro tear at 0700 ora  - left eye :mild patch of pigmented lattice at 0430, peripheral patch of lattice extending from 0500-0730, focal pigmented patch at 0730 equator  - discussed findings, prognosis, and treatment options including observation  - s/p laser retinopexy  OD (12.22.20) -- early laser changes present             - recommend laser retinopexy OS today (01.12.20)  - pt wishes to proceed  - RBA of procedure discussed, questions answered  - informed consent obtained and signed  - see procedure note  - start PF QID OD x7 days  - f/u 3-4 weeks, may need touch-up laser OD   3. No retinal edema on exam or OCT  4. Myopia with astigmatism OU  5,6. SLE on Plaquenil  - currently taking 200 mg BID (400 mg daily)  - no retinal toxicity noted on exam or OCT today  - pt's wt is 60 kg  - 400/60 = 6.67 mg/kg/day  - the AAO recommends daily dosing of < 5.0 mg/kg for HCQ  - recommend reduction of plaquenil dosing to < 5.0 mg/kg/day or switching to another agent for SLE  **300 mg daily would be 5 mg/kg/day**   Ophthalmic Meds Ordered this visit:  No orders of the defined types were placed in this encounter.      Return in about 4 weeks (around 01/12/2020) for DFE, OCT, possible laser touch up OD.  There are no Patient Instructions on file for this visit.   Explained the diagnoses, plan, and follow up with the patient and they expressed understanding.  Patient expressed understanding of the importance of proper follow up care.   This document serves as a record of services personally performed by Gardiner Sleeper, MD, PhD. It was created on their behalf by Estill Bakes, COT an ophthalmic technician. The creation of this record is the provider's dictation and/or activities during the visit.    Electronically signed by: Estill Bakes, COT 12/11/19 @ 11:26 AM  Gardiner Sleeper, M.D., Ph.D. Diseases & Surgery of the Retina and Vitreous [facility] 12/15/2019   I have reviewed the above documentation for accuracy and completeness, and I agree with the above. Gardiner Sleeper, M.D., Ph.D. 12/15/19 11:26 AM    Abbreviations: M myopia (nearsighted); A astigmatism; H hyperopia (farsighted); P presbyopia; Mrx spectacle prescription;   CTL contact lenses; OD right eye; OS left eye; OU both eyes  XT exotropia; ET esotropia; PEK punctate epithelial keratitis; PEE punctate epithelial erosions; DES dry eye syndrome; MGD meibomian gland dysfunction; ATs artificial tears; PFAT's preservative free artificial tears; [place] nuclear sclerotic cataract; PSC posterior subcapsular cataract; ERM epi-retinal membrane; PVD posterior vitreous detachment; RD retinal detachment; DM diabetes mellitus; DR diabetic retinopathy; NPDR non-proliferative diabetic retinopathy; PDR proliferative diabetic retinopathy; CSME clinically significant macular edema; DME diabetic macular edema; dbh dot blot hemorrhages; CWS cotton wool spot; POAG primary open angle glaucoma; C/D cup-to-disc ratio; HVF humphrey visual field; GVF goldmann visual field; OCT optical coherence tomography; IOP intraocular pressure; BRVO Branch retinal vein occlusion; CRVO central retinal vein occlusion; CRAO central retinal artery occlusion; BRAO branch retinal artery occlusion; RT retinal tear; SB scleral  buckle; PPV pars plana vitrectomy; VH Vitreous hemorrhage; PRP panretinal laser photocoagulation; IVK intravitreal kenalog; VMT vitreomacular traction; MH Macular hole;  NVD neovascularization of the disc; NVE neovascularization elsewhere; AREDS age related eye disease study; ARMD age related macular degeneration; POAG primary open angle glaucoma; EBMD epithelial/anterior basement membrane dystrophy; ACIOL anterior chamber intraocular lens; IOL intraocular lens; PCIOL posterior chamber intraocular lens; Phaco/IOL phacoemulsification with intraocular lens placement; [place] photorefractive keratectomy; LASIK laser assisted in situ keratomileusis; HTN hypertension; DM diabetes mellitus; COPD chronic obstructive pulmonary disease

## 2019-12-15 ENCOUNTER — Ambulatory Visit (INDEPENDENT_AMBULATORY_CARE_PROVIDER_SITE_OTHER): Payer: Medicaid Other | Admitting: Ophthalmology

## 2019-12-15 ENCOUNTER — Encounter (INDEPENDENT_AMBULATORY_CARE_PROVIDER_SITE_OTHER): Payer: Self-pay | Admitting: Ophthalmology

## 2019-12-15 DIAGNOSIS — H3581 Retinal edema: Secondary | ICD-10-CM | POA: Diagnosis not present

## 2019-12-15 DIAGNOSIS — M3219 Other organ or system involvement in systemic lupus erythematosus: Secondary | ICD-10-CM

## 2019-12-15 DIAGNOSIS — Z79899 Other long term (current) drug therapy: Secondary | ICD-10-CM

## 2019-12-15 DIAGNOSIS — H35413 Lattice degeneration of retina, bilateral: Secondary | ICD-10-CM | POA: Diagnosis not present

## 2019-12-15 DIAGNOSIS — H5213 Myopia, bilateral: Secondary | ICD-10-CM

## 2019-12-15 DIAGNOSIS — H33303 Unspecified retinal break, bilateral: Secondary | ICD-10-CM

## 2019-12-15 DIAGNOSIS — H52203 Unspecified astigmatism, bilateral: Secondary | ICD-10-CM

## 2019-12-21 ENCOUNTER — Telehealth: Payer: Medicaid Other | Admitting: Rheumatology

## 2019-12-30 ENCOUNTER — Ambulatory Visit (INDEPENDENT_AMBULATORY_CARE_PROVIDER_SITE_OTHER): Payer: Medicaid Other | Admitting: Family Medicine

## 2019-12-30 ENCOUNTER — Other Ambulatory Visit: Payer: Self-pay

## 2019-12-30 ENCOUNTER — Encounter: Payer: Self-pay | Admitting: Family Medicine

## 2019-12-30 VITALS — BP 105/69 | HR 73 | Ht 61.0 in | Wt 138.0 lb

## 2019-12-30 DIAGNOSIS — N6310 Unspecified lump in the right breast, unspecified quadrant: Secondary | ICD-10-CM

## 2019-12-30 DIAGNOSIS — Z0001 Encounter for general adult medical examination with abnormal findings: Secondary | ICD-10-CM | POA: Diagnosis not present

## 2019-12-30 DIAGNOSIS — Z0000 Encounter for general adult medical examination without abnormal findings: Secondary | ICD-10-CM

## 2019-12-30 NOTE — Progress Notes (Signed)
   GYNECOLOGY ANNUAL PREVENTATIVE CARE ENCOUNTER NOTE  Subjective:   Evelyn [name] is a 34 y.o. [phone] female here for a routine annual gynecologic exam.  Current complaints: right breast mass-- reports it started 1 week ago. Was a knot and painful. Now less so. Reports preceding right nipple discharge. She reports it felt like "when she was nursing and had a plugged duct" but youngest child is 64 yo.   Denies abnormal vaginal bleeding, discharge, pelvic pain, problems with intercourse or other gynecologic concerns.    Gynecologic History No LMP recorded. (Menstrual status: IUD). Contraception: IUD and BTL Last Pap: NIL. Results were: normal  The following portions of the patient's history were reviewed and updated as appropriate: allergies, current medications, past family history, past medical history, past social history, past surgical history and problem list.  Review of Systems Pertinent items are noted in HPI.   Objective:  BP 105/69   Pulse 73   Ht 5\' 1"  (1.549 m)   Wt 138 lb (62.6 kg)   BMI 26.07 kg/m  CONSTITUTIONAL: Well-developed, well-nourished female in no acute distress.  HENT:  Normocephalic, atraumatic, External right and left ear normal. Oropharynx is clear and moist EYES:  No scleral icterus.  NECK: Normal range of motion, supple, no masses.  Normal thyroid.  SKIN: Skin is warm and dry. No rash noted. Not diaphoretic. No erythema. No pallor. NEUROLOGIC: Alert and oriented to person, place, and time. Normal reflexes, muscle tone coordination. No cranial nerve deficit noted. PSYCHIATRIC: Normal mood and affect. Normal behavior. Normal judgment and thought content. CARDIOVASCULAR: Normal heart rate noted, regular rhythm. 2+ distal pulses. RESPIRATORY: Effort and breath sounds normal, no problems with respiration noted. BREASTS: Symmetric in size. No masses, skin changes, nipple drainage, or lymphadenopathy. ABDOMEN: Soft,  no distention noted.  No tenderness,  rebound or guarding.  PELVIC:not indicated MUSCULOSKELETAL: Normal range of motion.   Assessment and Plan:  1) Annual gynecologic examination - pap not indicated, 2019 NIL.  Routine preventative health maintenance measures emphasized.  1. Breast mass, right Concerning with 1-2cm induration in RUQ, near [place] and preceding nipple discharge. Maternal Cousin was recently diagnosed with breast cancer - MM Digital Diagnostic Bilat; Future - US BREAST LTD UNI RIGHT INC AXILLA; Future   Please refer to After Visit Summary for other counseling recommendations.   Return in about 1 year (around 12/29/2020) for Yearly wellness exam.  Caren Macadam, MD, MPH, ABFM Attending Physician Center for [facility]

## 2019-12-30 NOTE — Progress Notes (Signed)
Lump in right breast for a week that is sore

## 2020-01-01 ENCOUNTER — Encounter (INDEPENDENT_AMBULATORY_CARE_PROVIDER_SITE_OTHER): Payer: Self-pay | Admitting: Ophthalmology

## 2020-01-04 ENCOUNTER — Other Ambulatory Visit: Payer: Self-pay | Admitting: Family Medicine

## 2020-01-11 ENCOUNTER — Ambulatory Visit
Admission: RE | Admit: 2020-01-11 | Discharge: 2020-01-11 | Disposition: A | Discharge status: Home / Self Care | Payer: Medicaid Other | Source: Ambulatory Visit | Attending: Family Medicine | Admitting: Family Medicine

## 2020-01-11 ENCOUNTER — Other Ambulatory Visit: Payer: Self-pay | Admitting: Family Medicine

## 2020-01-11 DIAGNOSIS — N6310 Unspecified lump in the right breast, unspecified quadrant: Secondary | ICD-10-CM

## 2020-01-19 ENCOUNTER — Other Ambulatory Visit: Payer: Medicaid Other

## 2020-01-21 ENCOUNTER — Ambulatory Visit
Admission: RE | Admit: 2020-01-21 | Discharge: 2020-01-21 | Disposition: A | Discharge status: Home / Self Care | Payer: Medicaid Other | Source: Ambulatory Visit | Attending: Family Medicine | Admitting: Family Medicine

## 2020-01-21 ENCOUNTER — Other Ambulatory Visit: Payer: Self-pay

## 2020-01-21 ENCOUNTER — Other Ambulatory Visit: Payer: Self-pay | Admitting: Family Medicine

## 2020-01-21 DIAGNOSIS — N6310 Unspecified lump in the right breast, unspecified quadrant: Secondary | ICD-10-CM

## 2020-01-22 NOTE — Progress Notes (Signed)
[facility] Note  01/26/2020     CHIEF COMPLAINT Patient presents for Retina Follow Up   HISTORY OF PRESENT ILLNESS: Evelyn [name] is a 34 y.o. female who presents to the clinic today for:   HPI    Retina Follow Up    Patient presents with  Other.  In right eye.  This started 6 weeks ago.  Severity is moderate.  I, the attending physician,  performed the HPI with the patient and updated documentation appropriately.          Comments    Patient here for 6 weeks retina follow up for poss laser touch up OD for lattice. Patient states vision doing fine. Sees little floaters. No eye pain . Has a little stye OD lower lid. Little sore.        Last edited by Bernarda Caffey, MD on 01/26/2020  9:59 AM. (History)    Patient states no change in vision  Referring physician: Nickola Major, MD [address] [place] [address],  [place] [address]  HISTORICAL INFORMATION:   Selected notes from the [facility] Referred by Dr. Zenia Resides for eval of lattice holes OU.   CURRENT MEDICATIONS: Current Outpatient Medications (Ophthalmic Drugs)  Medication Sig  . prednisoLONE acetate (PRED FORTE) 1 % ophthalmic suspension Place 1 drop into the right eye 4 (four) times daily.   No current facility-administered medications for this visit. (Ophthalmic Drugs)   Current Outpatient Medications (Other)  Medication Sig  . celecoxib (CELEBREX) 200 MG capsule Take 200 mg by mouth 2 (two) times daily.  . Cholecalciferol (VITAMIN D) 50 MCG (2000 UT) CAPS Take by mouth daily.  . DULoxetine (CYMBALTA) 30 MG capsule TAKE 3 CAPSULES BY MOUTH EVERY DAY FOR PAIN OR DEPRESSION  . gabapentin (NEURONTIN) 300 MG capsule Take 1 tablet by mouth twice daily, then 4 capsules at bedtime.  . hydrocortisone 2.5 % cream APP EXT AA BID FOR FACIAL RASH  . hydroxychloroquine (PLAQUENIL) 200 MG tablet Take 1 tablet by mouth twice daily Monday through Friday only.  . lansoprazole  (PREVACID) 30 MG capsule   . LEVONORGESTREL IU 1 each by Intrauterine route once.   . methocarbamol (ROBAXIN) 500 MG tablet Take 500 mg by mouth 4 (four) times daily.  . pantoprazole (PROTONIX) 40 MG tablet Take 1 tablet (40 mg total) by mouth daily before breakfast.  . polyethylene glycol powder (MIRALAX) 17 GM/SCOOP powder Miralax 17 gram/dose oral powder  Take 17 g every day by oral route.  . pregabalin (LYRICA) 50 MG capsule Take 100 mg by mouth daily.   Marland Kitchen triamcinolone (KENALOG) 0.1 % paste Place onto teeth.  . valACYclovir (VALTREX) 500 MG tablet TAKE 1 TABLET(500 MG) BY MOUTH TWICE DAILY (Patient taking differently: as needed. )  . venlafaxine XR (EFFEXOR-XR) 150 MG 24 hr capsule TAKE 1 CAPSULE(150 MG) BY MOUTH DAILY   No current facility-administered medications for this visit. (Other)      REVIEW OF SYSTEMS: ROS    Positive for: Gastrointestinal, Eyes   Negative for: Constitutional, Neurological, Skin, Genitourinary, Musculoskeletal, HENT, Endocrine, Cardiovascular, Respiratory, Psychiatric, Allergic/Imm, Heme/Lymph   Last edited by Theodore Demark, COA on 01/26/2020  9:14 AM. (History)       ALLERGIES No Known Allergies  PAST MEDICAL HISTORY Past Medical History:  Diagnosis Date  . Allergy   . Depression    post partum  . GERD (gastroesophageal reflux disease)   . HELLP syndrome  2015  . HSV (herpes simplex virus) infection   . Numbness   . Systemic lupus erythematosus ([place])   . Umbilical hernia   . Vaginal delivery 2004, 2009, 2011   Past Surgical History:  Procedure Laterality Date  . CARPAL TUNNEL RELEASE Right 09/26/2018   Procedure: RIGHT CARPAL TUNNEL RELEASE;  Surgeon: Leandrew Koyanagi, MD;  Location: [facility];  Service: Orthopedics;  Laterality: Right;  . CARPAL TUNNEL RELEASE Left 2017  . CESAREAN SECTION N/A 08/05/2014   Procedure: CESAREAN SECTION;  Surgeon: Jerelyn Charles, MD;  Location: [facility];  Service: Obstetrics;  Laterality:  N/A;  . KNEE SURGERY Left 2000  . LAPAROSCOPIC BILATERAL SALPINGECTOMY N/A 12/13/2015   Procedure: LAPAROSCOPIC BILATERAL SALPINGECTOMY;  Surgeon: Emily Filbert, MD;  Location: [facility];  Service: Gynecology;  Laterality: N/A;  . PERINEOPLASTY N/A 02/20/2016   Procedure: PERINEOPLASTY;  Surgeon: Emily Filbert, MD;  Location: [facility];  Service: Gynecology;  Laterality: N/A;  . PUBOVAGINAL SLING N/A 12/13/2015   Procedure: Gaynelle Arabian;  Surgeon: Emily Filbert, MD;  Location: [facility];  Service: Gynecology;  Laterality: N/A;  . RECTOCELE REPAIR N/A 02/20/2016   Procedure: POSTERIOR REPAIR (RECTOCELE);  Surgeon: Emily Filbert, MD;  Location: [facility];  Service: Gynecology;  Laterality: N/A;  . UMBILICAL HERNIA REPAIR  2011    FAMILY HISTORY Family History  Problem Relation Age of Onset  . Healthy Mother   . Healthy Sister   . Healthy Brother   . Asthma Son   . Healthy Son   . Healthy Son   . Healthy Son   . Other Father        never met her father - unsure of his history  . Healthy Daughter   . Breast cancer Cousin 26       undergoing chemo/radiation  . Heart disease Neg Hx   . Neurologic Disorder Neg Hx   . Colon cancer Neg Hx   . Esophageal cancer Neg Hx     SOCIAL HISTORY Social History   Tobacco Use  . Smoking status: Never Smoker  . Smokeless tobacco: Never Used  Substance Use Topics  . Alcohol use: Not Currently  . Drug use: No         OPHTHALMIC EXAM:  Base Eye Exam    Visual Acuity (Snellen - Linear)      Right Left   Dist cc 20/20 20/20   Correction: Glasses       Tonometry (Tonopen, 9:10 AM)      Right Left   Pressure 9 8       Pupils      Dark Light Shape React APD   Right 4 3 Round Brisk None   Left 4 3 Round Brisk None       Visual Fields (Counting fingers)      Left Right    Full Full       Extraocular Movement      Right Left    Full, Ortho Full, Ortho       Neuro/Psych    Oriented x3: Yes   Mood/Affect: Normal       Dilation    Both  eyes: 1.0% Mydriacyl, 2.5% Phenylephrine @ 9:10 AM        Slit Lamp and Fundus Exam    Slit Lamp Exam      Right Left   Lids/Lashes Normal Normal   Conjunctiva/Sclera White and quiet White and quiet   Cornea Trace  Punctate epithelial erosions Trace Punctate epithelial erosions   Anterior Chamber Deep and quiet Deep and quiet   Iris Round and dilated Round and dilated   Lens Clear Clear   Vitreous Trace Vitreous syneresis, no cell Trace Vitreous syneresis, no cell       Fundus Exam      Right Left   Disc Pink and Sharp Pink and Sharp   C/D Ratio 0.3 0.3   Macula Flat, Good foveal reflex, No heme or edema Flat, Good foveal reflex, No heme or edema   Vessels Normal Normal   Periphery Attached, temporal WWP, flap tear at 0930 ora, small patch of lattice with atrophic holes at 1000--good laser changes surrounding, mild patch of lattice at 0600 very anterior micro tear at 0700 ora--light laser changes surrounding, new patch of lattice at 1030-11 Attached, mild patch of pigmented lattice at 0430, peripheral patch of lattice extending from 0500-0730, focal pigmented patch at 0730 equator -- excellent laser changes in place        Refraction    Wearing Rx      Sphere Cylinder Axis   Right -4.00 +1.00 103   Left -3.75 +1.00 073   Type: SVL          IMAGING AND PROCEDURES  Imaging and Procedures for _0 @  OCT, Retina - OU - Both Eyes       Right Eye Quality was good. Central Foveal Thickness: 291. Progression has been stable. Findings include normal foveal contour, no IRF, no SRF, vitreomacular adhesion .   Left Eye Quality was good. Central Foveal Thickness: 290. Progression has been stable. Findings include normal foveal contour, no IRF, no SRF, vitreomacular adhesion .   Notes *Images captured and stored on drive  Diagnosis / Impression:  NFP, no IRF/SRF OU--stable VMA OU No plaquenil toxicity OU   Clinical management:  See below  Abbreviations: NFP - Normal  foveal profile. CME - cystoid macular edema. PED - pigment epithelial detachment. IRF - intraretinal fluid. SRF - subretinal fluid. EZ - ellipsoid zone. ERM - epiretinal membrane. ORA - outer retinal atrophy. ORT - outer retinal tubulation. SRHM - subretinal hyper-reflective material         LASER PROCEDURE NOTE  Procedure:     Touch-up Barrier laser retinopexy using laser indirect ophthalmoscope, RIGHT eye  Diagnosis:      Retinal tears and lattice degeneration, RIGHT eye                New patch of lattice at 1030-11 and undertreated lattice at 6 and 7   Surgeon:        Bernarda Caffey, MD, PhD  Anesthesia:    Topical  Informed consent obtained, operative eye marked, and time out performed prior to initiation of laser.  Laser settings: Lumenis [id number] laser indirect ophthalmoscope Power: 280 mW Duration: 70 msec # spots: 549  Placement of laser: Laser was placed in three confluent rows all tears and patches of lattice as outlined above with additional rows anteriorly.  Complications: None.  Patient tolerated the procedure well and received written and verbal post-procedure care information/education.        ASSESSMENT/PLAN:    ICD-10-CM   1. Bilateral retinal lattice degeneration  H35.413   2. Bilateral retinal defect  H33.303   3. Retinal edema  H35.81 OCT, Retina - OU - Both Eyes  4. Myopia of both eyes with astigmatism  H52.13    H52.203   5. Long-term use of Plaquenil  Z79.899  6. Other systemic lupus erythematosus with other organ involvement ([place])  M32.19    1,2. Lattice degeneration w/ retinal defects, both eyes  - right eye: flap tear at 0930 ora, small patch of pigmented lattice with atrophic holes at 1000, mild patch at 0600, very anterior micro tear at 0700 ora  - left eye: mild patch of pigmented lattice at 0430, peripheral patch of lattice extending from 0500-0730, focal pigmented patch at 0730 equator  - s/p laser retinopexy OD  (12.22.20) -- good laser changes present             - s/p laser retinopexy OS (01.12.20) -- excellent laser changes in place  - light laser OD at 6 and 7 oclock patches and new patch of lattice identified at 1030-1100 oclock  - recommend laser touch up OD today, 02.23.21  - pt wishes to proceed  - RBA of procedure discussed, questions answered  - informed consent obtained and signed  - see procedure note  - re-start PF qid OD  - f/u 3-4 weeks -- POV, DFE/OCT  3. No retinal edema on exam or OCT  4. Myopia with astigmatism OU  5,6. SLE on Plaquenil  - currently taking 200 mg BID (400 mg daily)  - no retinal toxicity noted on exam or OCT today  - pt's wt is 60 kg  - 400/60 = 6.67 mg/kg/day  - the AAO recommends daily dosing of < 5.0 mg/kg for HCQ  - recommend reduction of plaquenil dosing to < 5.0 mg/kg/day or switching to another agent for SLE  **300 mg daily would be 5 mg/kg/day**   Ophthalmic Meds Ordered this visit:  Meds ordered this encounter  Medications  . prednisoLONE acetate (PRED FORTE) 1 % ophthalmic suspension    Sig: Place 1 drop into the right eye 4 (four) times daily.    Dispense:  10 mL    Refill:  0       Return for f/u 3-4 weeks lattice degen OU, DFE, OCT.  There are no Patient Instructions on file for this visit.   Explained the diagnoses, plan, and follow up with the patient and they expressed understanding.  Patient expressed understanding of the importance of proper follow up care.   This document serves as a record of services personally performed by Gardiner Sleeper, MD, PhD. It was created on their behalf by Estill Bakes, COT an ophthalmic technician. The creation of this record is the provider's dictation and/or activities during the visit.    Electronically signed by: Estill Bakes, COT 01/22/20 @ 12:24 AM   This document serves as a record of services personally performed by Gardiner Sleeper, MD, PhD. It was created on their behalf by Ernest Mallick, OA, an ophthalmic assistant. The creation of this record is the provider's dictation and/or activities during the visit.    Electronically signed by: Ernest Mallick, OA 02.23.2021 12:24 AM  Gardiner Sleeper, M.D., Ph.D. Diseases & Surgery of the Retina and [place] 01/26/2020   I have reviewed the above documentation for accuracy and completeness, and I agree with the above. Gardiner Sleeper, M.D., Ph.D. 01/27/20 12:24 AM   Abbreviations: M myopia (nearsighted); A astigmatism; H hyperopia (farsighted); P presbyopia; Mrx spectacle prescription;  CTL contact lenses; OD right eye; OS left eye; OU both eyes  XT exotropia; ET esotropia; PEK punctate epithelial keratitis; PEE punctate epithelial erosions; DES dry eye syndrome; MGD meibomian gland dysfunction; ATs artificial tears; PFAT's  preservative free artificial tears; [place] nuclear sclerotic cataract; PSC posterior subcapsular cataract; ERM epi-retinal membrane; PVD posterior vitreous detachment; RD retinal detachment; DM diabetes mellitus; DR diabetic retinopathy; NPDR non-proliferative diabetic retinopathy; PDR proliferative diabetic retinopathy; CSME clinically significant macular edema; DME diabetic macular edema; dbh dot blot hemorrhages; CWS cotton wool spot; POAG primary open angle glaucoma; C/D cup-to-disc ratio; HVF humphrey visual field; GVF goldmann visual field; OCT optical coherence tomography; IOP intraocular pressure; BRVO Branch retinal vein occlusion; CRVO central retinal vein occlusion; CRAO central retinal artery occlusion; BRAO branch retinal artery occlusion; RT retinal tear; SB scleral buckle; PPV pars plana vitrectomy; VH Vitreous hemorrhage; PRP panretinal laser photocoagulation; IVK intravitreal kenalog; VMT vitreomacular traction; MH Macular hole;  NVD neovascularization of the disc; NVE neovascularization elsewhere; AREDS age related eye disease study; ARMD age related macular degeneration;  POAG primary open angle glaucoma; EBMD epithelial/anterior basement membrane dystrophy; ACIOL anterior chamber intraocular lens; IOL intraocular lens; PCIOL posterior chamber intraocular lens; Phaco/IOL phacoemulsification with intraocular lens placement; [place] photorefractive keratectomy; LASIK laser assisted in situ keratomileusis; HTN hypertension; DM diabetes mellitus; COPD chronic obstructive pulmonary disease

## 2020-01-26 ENCOUNTER — Encounter (INDEPENDENT_AMBULATORY_CARE_PROVIDER_SITE_OTHER): Payer: Self-pay | Admitting: Ophthalmology

## 2020-01-26 ENCOUNTER — Other Ambulatory Visit: Payer: Self-pay

## 2020-01-26 ENCOUNTER — Ambulatory Visit (INDEPENDENT_AMBULATORY_CARE_PROVIDER_SITE_OTHER): Payer: Medicaid Other | Admitting: Ophthalmology

## 2020-01-26 DIAGNOSIS — H35413 Lattice degeneration of retina, bilateral: Secondary | ICD-10-CM

## 2020-01-26 DIAGNOSIS — H52203 Unspecified astigmatism, bilateral: Secondary | ICD-10-CM

## 2020-01-26 DIAGNOSIS — H5213 Myopia, bilateral: Secondary | ICD-10-CM

## 2020-01-26 DIAGNOSIS — Z79899 Other long term (current) drug therapy: Secondary | ICD-10-CM

## 2020-01-26 DIAGNOSIS — H33303 Unspecified retinal break, bilateral: Secondary | ICD-10-CM

## 2020-01-26 DIAGNOSIS — H3581 Retinal edema: Secondary | ICD-10-CM | POA: Diagnosis not present

## 2020-01-26 DIAGNOSIS — M3219 Other organ or system involvement in systemic lupus erythematosus: Secondary | ICD-10-CM

## 2020-01-26 MED ORDER — PREDNISOLONE ACETATE 1 % OP SUSP: 1.0000 [drp] | Freq: Four times a day (QID) | OPHTHALMIC | 0 refills | Status: DC

## 2020-02-08 ENCOUNTER — Ambulatory Visit: Payer: Self-pay | Admitting: Surgery

## 2020-02-08 DIAGNOSIS — D241 Benign neoplasm of right breast: Secondary | ICD-10-CM

## 2020-02-08 NOTE — H&P (Signed)
Doristine Section Documented: 02/08/2020 10:16 AM Location: [facility] Patient #: [id number] DOB: Jun 30, 1986 Married / Language: Cleophus Molt / Race: White Female  History of Present Illness Marcello Moores A. Yuridiana Formanek MD; 02/08/2020 12:56 PM) Patient words: [facility] [place] due to right nipple discharge and a painful lump in her right breast for one month. She had a clear discharge from her right nipple. Workup revealed dilation of ducts the nipple core biopsy showed findings consistent with possible papilloma. The pain location in her right breast is the upper outer quadrant. There's been no redness, mass or foul-smelling drainage. This is improved currently. Family history of breast cancer.   Recent painful lump in the right breast with yellow spontaneous right nipple discharge. The lump has resolved the pain remains in the region.  EXAM: DIGITAL DIAGNOSTIC BILATERAL MAMMOGRAM WITH CAD AND TOMO  ULTRASOUND RIGHT BREAST  COMPARISON: Previous exam(s).  ACR Breast Density Category c: The breast tissue is heterogeneously dense, which may obscure small masses.  FINDINGS: No suspicious masses, calcifications, or distortion are identified in either breast.  Mammographic images were processed with CAD.  On physical exam, no suspicious lumps are identified.  Targeted ultrasound is performed, showing a dilated noncompressible milk duct in the 9 o'clock periareolar region containing either an intraductal mass or internal debris. The mass versus debris measures 1.5 cm in length. No axillary adenopathy.  IMPRESSION: There is a noncompressible mildly dilated duct containing either a debris or mass in the right 9 o'clock periareolar region.  RECOMMENDATION: Recommend ultrasound-guided biopsy of the possible intraductal right breast mass.  I have discussed the findings and recommendations with the patient. If applicable, a reminder letter will be sent to  the patient regarding the next appointment.  BI-RADS CATEGORY 4: Suspicious.        Diagnosis Breast, right, needle core biopsy, retroareolar - DILATED DUCTS, SEE COMMENT. Microscopic Comment While distinct papillomatous fragments are not seen, there are dilated ducts and early changes suggestive of an intraductal papilloma. Excision is recommended. The case was called to [facility] on 01/22/2020.  The patient is a 34 year old female.   Past Surgical History (Chanel Teressa Senter, [place]; 02/08/2020 10:16 AM) Breast Biopsy Right. Cesarean Section - 1 Oral Surgery Ventral / Umbilical Hernia Surgery Bilateral.  Diagnostic Studies History (Chanel Teressa Senter, CMA; 02/08/2020 10:16 AM) Colonoscopy never Mammogram within last year Pap Smear 1-5 years ago  Allergies (Chanel Teressa Senter, CMA; 02/08/2020 10:17 AM) No Known Allergies [02/08/2020]: No Known Drug Allergies [02/08/2020]: Allergies Reconciled  Medication History (Chanel Teressa Senter, CMA; 02/08/2020 10:18 AM) Celecoxib (200MG  Capsule, Oral) Active. Lansoprazole (30MG  Capsule DR, Oral) Active. Hydroxychloroquine Sulfate (200MG  Tablet, Oral) Active. Venlafaxine HCl ER (150MG  Capsule ER 24HR, Oral) Active. Lyrica (Oral) Specific strength unknown - Active. Cholecalciferol (Oral) Specific strength unknown - Active. Medications Reconciled  Social History Antonietta Jewel, CMA; 02/08/2020 10:16 AM) Alcohol use Occasional alcohol use. Caffeine use Coffee. No drug use Tobacco use Never smoker.  Family History ([place], [place]; 02/08/2020 10:16 AM) Alcohol Abuse Brother.  Pregnancy / Birth History Antonietta Jewel, [place]; 02/08/2020 10:16 AM) Age at menarche 34 years. Contraceptive History Intrauterine device. Gravida 4 Irregular periods Length (months) of breastfeeding 12-24 Maternal age <15 Para 4  Other Problems (Chanel Teressa Senter, CMA; 02/08/2020 10:16 AM) Gastroesophageal Reflux  Disease Hemorrhoids Umbilical Hernia Repair     Review of Systems (Chanel Nolan CMA; 02/08/2020 10:16 AM) General Present- Fatigue and Night Sweats. Not Present- Appetite Loss, Chills, Fever, Weight  Gain and Weight Loss. Skin Not Present- Change in Wart/Mole, Dryness, Hives, Jaundice, New Lesions, Non-Healing Wounds, Rash and Ulcer. HEENT Present- Wears glasses/contact lenses. Not Present- Earache, Hearing Loss, Hoarseness, Nose Bleed, Oral Ulcers, Ringing in the Ears, Seasonal Allergies, Sinus Pain, Sore Throat, Visual Disturbances and Yellow Eyes. Respiratory Not Present- Bloody sputum, Chronic Cough, Difficulty Breathing, Snoring and Wheezing. Breast Present- Breast Pain. Not Present- Breast Mass, Nipple Discharge and Skin Changes. Cardiovascular Present- Shortness of Breath. Not Present- Chest Pain, Difficulty Breathing Lying Down, Leg Cramps, Palpitations, Rapid Heart Rate and Swelling of Extremities. Gastrointestinal Not Present- Abdominal Pain, Bloating, Bloody Stool, Change in Bowel Habits, Chronic diarrhea, Constipation, Difficulty Swallowing, Excessive gas, Gets full quickly at meals, Hemorrhoids, Indigestion, Nausea, Rectal Pain and Vomiting. Female Genitourinary Not Present- Frequency, Nocturia, Painful Urination, Pelvic Pain and Urgency. Musculoskeletal Present- Joint Pain, Joint Stiffness and Muscle Pain. Not Present- Back Pain, Muscle Weakness and Swelling of Extremities. Neurological Not Present- Decreased Memory, Fainting, Headaches, Numbness, Seizures, Tingling, Tremor, Trouble walking and Weakness. Psychiatric Not Present- Anxiety, Bipolar, Change in Sleep Pattern, Depression, Fearful and Frequent crying. Endocrine Not Present- Cold Intolerance, Excessive Hunger, Hair Changes, Heat Intolerance, Hot flashes and New Diabetes. Hematology Not Present- Blood Thinners, Easy Bruising, Excessive bleeding, Gland problems, HIV and Persistent Infections.  Vitals (Chanel Nolan CMA;  02/08/2020 10:19 AM) 02/08/2020 10:18 AM Weight: 138 lb Height: 61in Body Surface Area: 1.61 m Body Mass Index: 26.07 kg/m  Temp.: 98.35F  Pulse: 88 (Regular)  BP: 108/70 (Sitting, Left Arm, Standard)        Physical Exam (Dezmon Conover A. Kreig Parson MD; 02/08/2020 12:57 PM)  General Mental Status-Alert. General Appearance-Consistent with stated age. Hydration-Well hydrated. Voice-Normal.  Head and Neck Head-normocephalic, atraumatic with no lesions or palpable masses. Trachea-midline. Thyroid Gland Characteristics - normal size and consistency.  Chest and Lung Exam Note: Lung sounds clear, work of breathing normal  Breast Note: Right breast is normal. Left breast is normal. Of note, no discharge noted today on examination bilaterally.  Cardiovascular Note: No JVD, normal sinus rhythm  Neurologic Neurologic evaluation reveals -alert and oriented x 3 with no impairment of recent or remote memory. Mental Status-Normal.  Lymphatic Head & Neck  General Head & Neck Lymphatics: Bilateral - Description - Normal. Axillary  General Axillary Region: Bilateral - Description - Normal. Tenderness - Non Tender.    Assessment & Plan (Yulanda Diggs A. Sevag Shearn MD; 02/08/2020 12:57 PM)  INTRADUCTAL PAPILLOMA OF BREAST, RIGHT (D24.1) Impression: Discussed lumpectomy versus observation. Discussed potential upgrade risk of 10% in this setting. The patient has chosen right breast seed lumpectomy.     Risk of lumpectomy include bleeding, infection, seroma, more surgery, use of seed/wire, wound care, cosmetic deformity and the need for other treatments, death , blood clots, death. Pt agrees to proceed.  Current Plans You are being scheduled for surgery- Our schedulers will call you.  You should hear from our office's scheduling department within 5 working days about the location, date, and time of surgery. We try to make accommodations for patient's preferences in  scheduling surgery, but sometimes the OR schedule or the surgeon's schedule prevents [place] from making those accommodations.  If you have not heard from our office [phone]) in 5 working days, call the office and ask for your surgeon's nurse.  If you have other questions about your diagnosis, plan, or surgery, call the office and ask for your surgeon's nurse.  Pt Education - [facility] Breast Biopsy HCI: discussed with patient and provided information.

## 2020-02-08 NOTE — H&P (View-Only) (Signed)
Evelyn [name] Documented: 02/08/2020 10:16 AM Location: [facility] Patient #: [id number] DOB: 06/30/1986 Married / Language: Evelyn [name] / Race: White Female  History of Present Illness Evelyn [name]; 02/08/2020 12:56 PM) Patient words: Patient sent [facility] [place] due to right nipple discharge and a painful lump in her right breast for one month. She had a clear discharge from her right nipple. Workup revealed dilation of ducts the nipple core biopsy showed findings consistent with possible papilloma. The pain location in her right breast is the upper outer quadrant. There's been no redness, mass or foul-smelling drainage. This is improved currently. Family history of breast cancer.   Recent painful lump in the right breast with yellow spontaneous right nipple discharge. The lump has resolved the pain remains in the region.  EXAM: DIGITAL DIAGNOSTIC BILATERAL MAMMOGRAM WITH CAD AND TOMO  ULTRASOUND RIGHT BREAST  COMPARISON: Previous exam(s).  ACR Breast Density Category c: The breast tissue is heterogeneously dense, which may obscure small masses.  FINDINGS: No suspicious masses, calcifications, or distortion are identified in either breast.  Mammographic images were processed with CAD.  On physical exam, no suspicious lumps are identified.  Targeted ultrasound is performed, showing a dilated noncompressible milk duct in the 9 o'clock periareolar region containing either an intraductal mass or internal debris. The mass versus debris measures 1.5 cm in length. No axillary adenopathy.  IMPRESSION: There is a noncompressible mildly dilated duct containing either a debris or mass in the right 9 o'clock periareolar region.  RECOMMENDATION: Recommend ultrasound-guided biopsy of the possible intraductal right breast mass.  I have discussed the findings and recommendations with the patient. If applicable, a reminder letter will be sent to  the patient regarding the next appointment.  BI-RADS CATEGORY 4: Suspicious.        Diagnosis Breast, right, needle core biopsy, retroareolar - DILATED DUCTS, SEE COMMENT. Microscopic Comment While distinct papillomatous fragments are not seen, there are dilated ducts and early changes suggestive of an intraductal papilloma. Excision is recommended. The case was called to [facility] on 01/22/2020.  The patient is a 34 year old female.   Past Surgical History (Evelyn [name], Evelyn [name]; 02/08/2020 10:16 AM) Breast Biopsy Right. Cesarean [name] - 1 Oral Surgery Ventral / Umbilical Hernia Surgery Bilateral.  Diagnostic Studies History (Evelyn [name], Evelyn [name]; 02/08/2020 10:16 AM) Colonoscopy never Mammogram within last year Pap Smear 1-5 years ago  Allergies (Evelyn [name], Evelyn [name]; 02/08/2020 10:17 AM) No Known Allergies [02/08/2020]: No Known Drug Allergies [02/08/2020]: Allergies Reconciled  Medication History (Evelyn [name], Evelyn [name]; 02/08/2020 10:18 AM) Celecoxib (200MG  Capsule, Oral) Active. Lansoprazole (30MG  Capsule DR, Oral) Active. Hydroxychloroquine Sulfate (200MG  Tablet, Oral) Active. Venlafaxine HCl ER (150MG  Capsule ER 24HR, Oral) Active. Lyrica (Oral) Specific strength unknown - Active. Cholecalciferol (Oral) Specific strength unknown - Active. Medications Reconciled  Social History Evelyn [name], Evelyn [name]; 02/08/2020 10:16 AM) Alcohol use Occasional alcohol use. Caffeine use Coffee. No drug use Tobacco use Never smoker.  Family History (Evelyn [name], Evelyn [name]; 02/08/2020 10:16 AM) Alcohol Abuse Brother.  Pregnancy / Birth History Evelyn [name], Evelyn [name]; 02/08/2020 10:16 AM) Age at menarche 45 years. Contraceptive History Intrauterine device. Gravida 4 Irregular periods Length (months) of breastfeeding 12-24 Maternal age <15 Para 4  Other Problems (Evelyn [name], Evelyn [name]; 02/08/2020 10:16 AM) Gastroesophageal Reflux  Disease Hemorrhoids Umbilical Hernia Repair     Review of Systems (Evelyn [name]; 02/08/2020 10:16 AM) General Present- Fatigue and Night Sweats. Not Present- Appetite Loss, Chills, Fever, Weight  Gain and Weight Loss. Skin Not Present- Change in Wart/Mole, Dryness, Hives, Jaundice, New Lesions, Non-Healing Wounds, Rash and Ulcer. HEENT Present- Wears glasses/contact lenses. Not Present- Earache, Hearing Loss, Hoarseness, Nose Bleed, Oral Ulcers, Ringing in the Ears, Seasonal Allergies, Sinus Pain, Sore Throat, Visual Disturbances and Yellow Eyes. Respiratory Not Present- Bloody sputum, Chronic Cough, Difficulty Breathing, Snoring and Wheezing. Breast Present- Breast Pain. Not Present- Breast Mass, Nipple Discharge and Skin Changes. Cardiovascular Present- Shortness of Breath. Not Present- Chest Pain, Difficulty Breathing Lying Down, Leg Cramps, Palpitations, Rapid Heart Rate and Swelling of Extremities. Gastrointestinal Not Present- Abdominal Pain, Bloating, Bloody Stool, Change in Bowel Habits, Chronic diarrhea, Constipation, Difficulty Swallowing, Excessive gas, Gets full quickly at meals, Hemorrhoids, Indigestion, Nausea, Rectal Pain and Vomiting. Female Genitourinary Not Present- Frequency, Nocturia, Painful Urination, Pelvic Pain and Urgency. Musculoskeletal Present- Joint Pain, Joint Stiffness and Muscle Pain. Not Present- Back Pain, Muscle Weakness and Swelling of Extremities. Neurological Not Present- Decreased Memory, Fainting, Headaches, Numbness, Seizures, Tingling, Tremor, Trouble walking and Weakness. Psychiatric Not Present- Anxiety, Bipolar, Change in Sleep Pattern, Depression, Fearful and Frequent crying. Endocrine Not Present- Cold Intolerance, Excessive Hunger, Hair Changes, Heat Intolerance, Hot flashes and New Diabetes. Hematology Not Present- Blood Thinners, Easy Bruising, Excessive bleeding, Gland problems, HIV and Persistent Infections.  Vitals (Evelyn [name];  02/08/2020 10:19 AM) 02/08/2020 10:18 AM Weight: 138 lb Height: 61in Body Surface Area: 1.61 m Body Mass Index: 26.07 kg/m  Temp.: 98.6F  Pulse: 88 (Regular)  BP: 108/70 (Sitting, Left Arm, Standard)        Physical Exam (Evelyn [name]; 02/08/2020 12:57 PM)  General Mental Status-Alert. General Appearance-Consistent with stated age. Hydration-Well hydrated. Voice-Normal.  Head and Neck Head-normocephalic, atraumatic with no lesions or palpable masses. Trachea-midline. Thyroid Gland Characteristics - normal size and consistency.  Chest and Lung Exam Note: Lung sounds clear, work of breathing normal  Breast Note: Right breast is normal. Left breast is normal. Of note, no discharge noted today on examination bilaterally.  Cardiovascular Note: No JVD, normal sinus rhythm  Neurologic Neurologic evaluation reveals -alert and oriented x 3 with no impairment of recent or remote memory. Mental Status-Normal.  Lymphatic Head & Neck  General Head & Neck Lymphatics: Bilateral - Description - Normal. Axillary  General Axillary Region: Bilateral - Description - Normal. Tenderness - Non Tender.    Assessment & Plan (Evelyn [name]; 02/08/2020 12:57 PM)  INTRADUCTAL PAPILLOMA OF BREAST, RIGHT (D24.1) Impression: Discussed lumpectomy versus observation. Discussed potential upgrade risk of 10% in this setting. The patient has chosen right breast seed lumpectomy.     Risk of lumpectomy include bleeding, infection, seroma, more surgery, use of seed/wire, wound care, cosmetic deformity and the need for other treatments, death , blood clots, death. Pt agrees to proceed.  Current Plans You are being scheduled for surgery- Our schedulers will call you.  You should hear from our office's scheduling department within 5 working days about the location, date, and time of surgery. We try to make accommodations for patient's preferences in  scheduling surgery, but sometimes the OR schedule or the surgeon's schedule prevents [place] from making those accommodations.  If you have not heard from our office [phone] in 5 working days, call the office and ask for your surgeon's nurse.  If you have other questions about your diagnosis, plan, or surgery, call the office and ask for your surgeon's nurse.  Pt Education - [facility] Breast Biopsy HCI: discussed with patient and provided information.

## 2020-02-11 ENCOUNTER — Other Ambulatory Visit: Payer: Self-pay | Admitting: Surgery

## 2020-02-11 DIAGNOSIS — D241 Benign neoplasm of right breast: Secondary | ICD-10-CM

## 2020-02-22 NOTE — Progress Notes (Signed)
[facility] Note  02/23/2020     CHIEF COMPLAINT Patient presents for Post-op Follow-up   HISTORY OF PRESENT ILLNESS: Evelyn [name] is a 34 y.o. female who presents to the clinic today for:   HPI    Post-op Follow-up    In right eye.  Discomfort includes foreign body sensation.  Vision is stable.  I, the attending physician,  performed the HPI with the patient and updated documentation appropriately.          Comments    34 y/o female pt here for 4 wk POV s/p laser touch-up OD for new patch of lattice on 2.23.21.  No change in [place] OU.  Denies pain, flashes, floaters, but had some FBS OD that lasted for several hrs about 1 wk ago.  Symptom resolved w/use of AT.  No gtts.       Last edited by Bernarda Caffey, MD on 02/23/2020  9:39 AM. (History)    Patient states her dr has not changed her Plaquenil dosage, she states she has been on it since November 2019, but she only takes it Monday-Friday, she states she had an episode where her left eye felt like it had sand in it and it looked like she had "blinders" in her vision, she states it only lasted about 30 seconds and hasn't come back  Referring physician: Debbra Riding, MD [address] [place],  [place] [id number]  HISTORICAL INFORMATION:   Selected notes from the [facility] Referred by Dr. Zenia Resides for eval of lattice holes OU.   CURRENT MEDICATIONS: Current Outpatient Medications (Ophthalmic Drugs)  Medication Sig  . prednisoLONE acetate (PRED FORTE) 1 % ophthalmic suspension Place 1 drop into the right eye 4 (four) times daily.   No current facility-administered medications for this visit. (Ophthalmic Drugs)   Current Outpatient Medications (Other)  Medication Sig  . celecoxib (CELEBREX) 200 MG capsule Take 200 mg by mouth 2 (two) times daily.  . Cholecalciferol (VITAMIN D) 50 MCG (2000 UT) CAPS Take by mouth daily.  . DULoxetine (CYMBALTA) 30 MG capsule TAKE 3  CAPSULES BY MOUTH EVERY DAY FOR PAIN OR DEPRESSION  . gabapentin (NEURONTIN) 300 MG capsule Take 1 tablet by mouth twice daily, then 4 capsules at bedtime.  . hydrocortisone 2.5 % cream APP EXT AA BID FOR FACIAL RASH  . hydroxychloroquine (PLAQUENIL) 200 MG tablet Take 1 tablet by mouth twice daily Monday through Friday only.  . lansoprazole (PREVACID) 30 MG capsule   . LEVONORGESTREL IU 1 each by Intrauterine route once.   . methocarbamol (ROBAXIN) 500 MG tablet Take 500 mg by mouth 4 (four) times daily.  . pantoprazole (PROTONIX) 40 MG tablet Take 1 tablet (40 mg total) by mouth daily before breakfast.  . polyethylene glycol powder (MIRALAX) 17 GM/SCOOP powder Miralax 17 gram/dose oral powder  Take 17 g every day by oral route.  . pregabalin (LYRICA) 50 MG capsule Take 100 mg by mouth daily.   Marland Kitchen triamcinolone (KENALOG) 0.1 % paste Place onto teeth.  . valACYclovir (VALTREX) 500 MG tablet TAKE 1 TABLET(500 MG) BY MOUTH TWICE DAILY (Patient taking differently: as needed. )  . venlafaxine XR (EFFEXOR-XR) 150 MG 24 hr capsule TAKE 1 CAPSULE(150 MG) BY MOUTH DAILY   No current facility-administered medications for this visit. (Other)      REVIEW OF SYSTEMS: ROS    Positive for: Gastrointestinal, Musculoskeletal, Eyes   Negative for: Constitutional, Neurological, Skin,  Genitourinary, HENT, Endocrine, Cardiovascular, Respiratory, Psychiatric, Allergic/Imm, Heme/Lymph   Last edited by Matthew Folks, COA on 02/23/2020  9:23 AM. (History)       ALLERGIES No Known Allergies  PAST MEDICAL HISTORY Past Medical History:  Diagnosis Date  . Allergy   . Depression    post partum  . GERD (gastroesophageal reflux disease)   . HELLP syndrome    2015  . HSV (herpes simplex virus) infection   . Numbness   . Systemic lupus erythematosus ([place])   . Umbilical hernia   . Vaginal delivery 2004, 2009, 2011   Past Surgical History:  Procedure Laterality Date  . CARPAL TUNNEL RELEASE Right  09/26/2018   Procedure: RIGHT CARPAL TUNNEL RELEASE;  Surgeon: Leandrew Koyanagi, MD;  Location: [facility];  Service: Orthopedics;  Laterality: Right;  . CARPAL TUNNEL RELEASE Left 2017  . CESAREAN SECTION N/A 08/05/2014   Procedure: CESAREAN SECTION;  Surgeon: Jerelyn Charles, MD;  Location: [facility];  Service: Obstetrics;  Laterality: N/A;  . KNEE SURGERY Left 2000  . LAPAROSCOPIC BILATERAL SALPINGECTOMY N/A 12/13/2015   Procedure: LAPAROSCOPIC BILATERAL SALPINGECTOMY;  Surgeon: Emily Filbert, MD;  Location: [facility];  Service: Gynecology;  Laterality: N/A;  . PERINEOPLASTY N/A 02/20/2016   Procedure: PERINEOPLASTY;  Surgeon: Emily Filbert, MD;  Location: [facility];  Service: Gynecology;  Laterality: N/A;  . PUBOVAGINAL SLING N/A 12/13/2015   Procedure: Gaynelle Arabian;  Surgeon: Emily Filbert, MD;  Location: [facility];  Service: Gynecology;  Laterality: N/A;  . RECTOCELE REPAIR N/A 02/20/2016   Procedure: POSTERIOR REPAIR (RECTOCELE);  Surgeon: Emily Filbert, MD;  Location: [facility];  Service: Gynecology;  Laterality: N/A;  . UMBILICAL HERNIA REPAIR  2011    FAMILY HISTORY Family History  Problem Relation Age of Onset  . Healthy Mother   . Healthy Sister   . Healthy Brother   . Asthma Son   . Healthy Son   . Healthy Son   . Healthy Son   . Other Father        never met her father - unsure of his history  . Healthy Daughter   . Breast cancer Cousin 26       undergoing chemo/radiation  . Heart disease Neg Hx   . Neurologic Disorder Neg Hx   . Colon cancer Neg Hx   . Esophageal cancer Neg Hx     SOCIAL HISTORY Social History   Tobacco Use  . Smoking status: Never Smoker  . Smokeless tobacco: Never Used  Substance Use Topics  . Alcohol use: Not Currently  . Drug use: No         OPHTHALMIC EXAM:  Base Eye Exam    Visual Acuity (Snellen - Linear)      Right Left   Dist cc 20/20 + 20/15 -2   Correction: Glasses       Tonometry (Tonopen, 9:25 AM)      Right Left   Pressure  10 11       Pupils      Dark Light Shape React APD   Right 4 3 Round Brisk None   Left 4 3 Round Brisk None       Visual Fields (Counting fingers)      Left Right    Full Full       Extraocular Movement      Right Left    Full, Ortho Full, Ortho       Neuro/Psych  Oriented x3: Yes   Mood/Affect: Normal       Dilation    Both eyes: 1.0% Mydriacyl, 2.5% Phenylephrine @ 9:25 AM        Slit Lamp and Fundus Exam    Slit Lamp Exam      Right Left   Lids/Lashes Normal Normal   Conjunctiva/Sclera White and quiet White and quiet   Cornea Trace Punctate epithelial erosions Trace Punctate epithelial erosions   Anterior Chamber Deep and quiet Deep and quiet   Iris Round and dilated Round and dilated   Lens Clear Clear   Vitreous Trace Vitreous syneresis, no cell Trace Vitreous syneresis, no cell       Fundus Exam      Right Left   Disc Pink and Sharp Pink and Sharp   C/D Ratio 0.3 0.3   Macula Flat, Good foveal reflex, No heme or edema Flat, Good foveal reflex, No heme or edema   Vessels Normal Normal   Periphery Attached, temporal WWP, flap tear at 0930 ora, small patch of lattice with atrophic holes at 1000--good laser changes surrounding, mild patch of lattice at 0600 very anterior micro tear at 0700 ora--light laser changes surrounding, new patch of lattice at 1030-11 -- good laser in place around all lesions Attached, mild patch of pigmented lattice at 0430, peripheral patch of lattice extending from 0500-0730, focal pigmented patch at 0730 equator -- excellent laser changes in place          IMAGING AND PROCEDURES         ASSESSMENT/PLAN:    ICD-10-CM   1. Bilateral retinal lattice degeneration  H35.413   2. Bilateral retinal defect  H33.303   3. Retinal edema  H35.81 OCT, Retina - OU - Both Eyes  4. Myopia of both eyes with astigmatism  H52.13    H52.203   5. Long-term use of Plaquenil  Z79.899   6. Other systemic lupus erythematosus with other organ  involvement ([place])  M32.19    1,2. Lattice degeneration w/ retinal defects, both eyes  - right eye: flap tear at 0930 ora, small patch of pigmented lattice with atrophic holes at 1000, mild patch at 0600, very anterior micro tear at 0700 ora  - left eye: mild patch of pigmented lattice at 0430, peripheral patch of lattice extending from 0500-0730, focal pigmented patch at 0730 equator  - s/p laser retinopexy OD (12.22.20),laser touch up OD 02.23.21 -- excellent laser in place             - s/p laser retinopexy OS (01.12.20) -- excellent laser changes in place  - f/u 1 year -- POV, DFE/OCT  3. No retinal edema on exam or OCT  4. Myopia with astigmatism OU  5,6. SLE on Plaquenil  - currently taking 200 mg BID (400 mg daily)  - no retinal toxicity noted on exam or OCT today  - pt's wt is 60 kg  - 400/60 = 6.67 mg/kg/day  - the AAO recommends daily dosing of < 5.0 mg/kg for HCQ  - recommend reduction of plaquenil dosing to < 5.0 mg/kg/day or switching to another agent for SLE  **300 mg daily would be 5 mg/kg/day**  - pt only the '400mg'$  takes Monday-Friday, which averages out to 4.76 mg/kg/day   Ophthalmic Meds Ordered this visit:  No orders of the defined types were placed in this encounter.      Return in about 1 year (around 02/22/2021) for f/u 1 year, plaquenil check OU, DFE,  OCT.  There are no Patient Instructions on file for this visit.   Explained the diagnoses, plan, and follow up with the patient and they expressed understanding.  Patient expressed understanding of the importance of proper follow up care.   This document serves as a record of services personally performed by Gardiner Sleeper, MD, PhD. It was created on their behalf by Estill Bakes, COT an ophthalmic technician. The creation of this record is the provider's dictation and/or activities during the visit.    Electronically signed by: Estill Bakes, COT 02/22/20 @ 1:08 PM.  This document serves as a record of  services personally performed by Gardiner Sleeper, MD, PhD. It was created on their behalf by Ernest Mallick, OA, an ophthalmic assistant. The creation of this record is the provider's dictation and/or activities during the visit.    Electronically signed by: Ernest Mallick, OA 03.23.2021 1:08 PM  Gardiner Sleeper, M.D., Ph.D. Diseases & Surgery of the Retina and [place] 02/23/2020   I have reviewed the above documentation for accuracy and completeness, and I agree with the above. Gardiner Sleeper, M.D., Ph.D. 02/23/20 1:08 PM   Abbreviations: M myopia (nearsighted); A astigmatism; H hyperopia (farsighted); P presbyopia; Mrx spectacle prescription;  CTL contact lenses; OD right eye; OS left eye; OU both eyes  XT exotropia; ET esotropia; PEK punctate epithelial keratitis; PEE punctate epithelial erosions; DES dry eye syndrome; MGD meibomian gland dysfunction; ATs artificial tears; PFAT's preservative free artificial tears; [place] nuclear sclerotic cataract; PSC posterior subcapsular cataract; ERM epi-retinal membrane; PVD posterior vitreous detachment; RD retinal detachment; DM diabetes mellitus; DR diabetic retinopathy; NPDR non-proliferative diabetic retinopathy; PDR proliferative diabetic retinopathy; CSME clinically significant macular edema; DME diabetic macular edema; dbh dot blot hemorrhages; CWS cotton wool spot; POAG primary open angle glaucoma; C/D cup-to-disc ratio; HVF humphrey visual field; GVF goldmann visual field; OCT optical coherence tomography; IOP intraocular pressure; BRVO Branch retinal vein occlusion; CRVO central retinal vein occlusion; CRAO central retinal artery occlusion; BRAO branch retinal artery occlusion; RT retinal tear; SB scleral buckle; PPV pars plana vitrectomy; VH Vitreous hemorrhage; PRP panretinal laser photocoagulation; IVK intravitreal kenalog; VMT vitreomacular traction; MH Macular hole;  NVD neovascularization of the disc; NVE  neovascularization elsewhere; AREDS age related eye disease study; ARMD age related macular degeneration; POAG primary open angle glaucoma; EBMD epithelial/anterior basement membrane dystrophy; ACIOL anterior chamber intraocular lens; IOL intraocular lens; PCIOL posterior chamber intraocular lens; Phaco/IOL phacoemulsification with intraocular lens placement; [place] photorefractive keratectomy; LASIK laser assisted in situ keratomileusis; HTN hypertension; DM diabetes mellitus; COPD chronic obstructive pulmonary disease

## 2020-02-23 ENCOUNTER — Other Ambulatory Visit: Payer: Self-pay

## 2020-02-23 ENCOUNTER — Ambulatory Visit (INDEPENDENT_AMBULATORY_CARE_PROVIDER_SITE_OTHER): Payer: Medicaid Other | Admitting: Ophthalmology

## 2020-02-23 ENCOUNTER — Encounter (INDEPENDENT_AMBULATORY_CARE_PROVIDER_SITE_OTHER): Payer: Self-pay | Admitting: Ophthalmology

## 2020-02-23 DIAGNOSIS — M3219 Other organ or system involvement in systemic lupus erythematosus: Secondary | ICD-10-CM

## 2020-02-23 DIAGNOSIS — Z79899 Other long term (current) drug therapy: Secondary | ICD-10-CM

## 2020-02-23 DIAGNOSIS — H5213 Myopia, bilateral: Secondary | ICD-10-CM

## 2020-02-23 DIAGNOSIS — H35413 Lattice degeneration of retina, bilateral: Secondary | ICD-10-CM

## 2020-02-23 DIAGNOSIS — H33303 Unspecified retinal break, bilateral: Secondary | ICD-10-CM

## 2020-02-23 DIAGNOSIS — H3581 Retinal edema: Secondary | ICD-10-CM | POA: Diagnosis not present

## 2020-02-23 DIAGNOSIS — H52203 Unspecified astigmatism, bilateral: Secondary | ICD-10-CM

## 2020-03-01 ENCOUNTER — Encounter (HOSPITAL_BASED_OUTPATIENT_CLINIC_OR_DEPARTMENT_OTHER): Payer: Self-pay | Admitting: Surgery

## 2020-03-01 ENCOUNTER — Other Ambulatory Visit: Payer: Self-pay

## 2020-03-05 ENCOUNTER — Other Ambulatory Visit (HOSPITAL_COMMUNITY): Payer: Medicaid Other

## 2020-03-07 ENCOUNTER — Encounter (HOSPITAL_BASED_OUTPATIENT_CLINIC_OR_DEPARTMENT_OTHER)
Admission: RE | Admit: 2020-03-07 | Discharge: 2020-03-07 | Disposition: A | Discharge status: Home / Self Care | Payer: Medicaid Other | Source: Ambulatory Visit | Attending: Surgery | Admitting: Surgery

## 2020-03-07 ENCOUNTER — Other Ambulatory Visit (HOSPITAL_COMMUNITY)
Admission: RE | Admit: 2020-03-07 | Discharge: 2020-03-07 | Disposition: A | Discharge status: Home / Self Care | Payer: Medicaid Other | Source: Ambulatory Visit | Attending: Surgery | Admitting: Surgery

## 2020-03-07 DIAGNOSIS — Z79899 Other long term (current) drug therapy: Secondary | ICD-10-CM | POA: Diagnosis not present

## 2020-03-07 DIAGNOSIS — M329 Systemic lupus erythematosus, unspecified: Secondary | ICD-10-CM | POA: Diagnosis not present

## 2020-03-07 DIAGNOSIS — Z20822 Contact with and (suspected) exposure to covid-19: Secondary | ICD-10-CM | POA: Insufficient documentation

## 2020-03-07 DIAGNOSIS — N6011 Diffuse cystic mastopathy of right breast: Secondary | ICD-10-CM | POA: Diagnosis not present

## 2020-03-07 DIAGNOSIS — K219 Gastro-esophageal reflux disease without esophagitis: Secondary | ICD-10-CM | POA: Diagnosis not present

## 2020-03-07 DIAGNOSIS — Z791 Long term (current) use of non-steroidal anti-inflammatories (NSAID): Secondary | ICD-10-CM | POA: Diagnosis not present

## 2020-03-07 DIAGNOSIS — N6489 Other specified disorders of breast: Secondary | ICD-10-CM | POA: Diagnosis not present

## 2020-03-07 DIAGNOSIS — D241 Benign neoplasm of right breast: Secondary | ICD-10-CM | POA: Diagnosis present

## 2020-03-07 DIAGNOSIS — F329 Major depressive disorder, single episode, unspecified: Secondary | ICD-10-CM | POA: Diagnosis not present

## 2020-03-07 DIAGNOSIS — Z01812 Encounter for preprocedural laboratory examination: Secondary | ICD-10-CM | POA: Diagnosis present

## 2020-03-07 LAB — CBC WITH DIFFERENTIAL/PLATELET
Abs Immature Granulocytes: 0.02 10*3/uL (ref 0.00–0.07)
Basophils Absolute: 0 10*3/uL (ref 0.0–0.1)
Basophils Relative: 1 %
Eosinophils Absolute: 0.1 10*3/uL (ref 0.0–0.5)
Eosinophils Relative: 2 %
HCT: 37.3 % (ref 36.0–46.0)
Hemoglobin: 12.5 g/dL (ref 12.0–15.0)
Immature Granulocytes: 0 %
Lymphocytes Relative: 25 %
Lymphs Abs: 1.3 10*3/uL (ref 0.7–4.0)
MCH: 30.2 pg (ref 26.0–34.0)
MCHC: 33.5 g/dL (ref 30.0–36.0)
MCV: 90.1 fL (ref 80.0–100.0)
Monocytes Absolute: 0.4 10*3/uL (ref 0.1–1.0)
Monocytes Relative: 8 %
Neutro Abs: 3.2 10*3/uL (ref 1.7–7.7)
Neutrophils Relative %: 64 %
Platelets: 254 10*3/uL (ref 150–400)
RBC: 4.14 MIL/uL (ref 3.87–5.11)
RDW: 12.7 % (ref 11.5–15.5)
WBC: 5 10*3/uL (ref 4.0–10.5)
nRBC: 0 % (ref 0.0–0.2)

## 2020-03-07 LAB — POCT PREGNANCY, URINE: Preg Test, Ur: NEGATIVE

## 2020-03-07 LAB — COMPREHENSIVE METABOLIC PANEL
ALT: 47 U/L — ABNORMAL HIGH (ref 0–44)
AST: 32 U/L (ref 15–41)
Albumin: 4 g/dL (ref 3.5–5.0)
Alkaline Phosphatase: 77 U/L (ref 38–126)
Anion gap: 5 (ref 5–15)
BUN: 13 mg/dL (ref 6–20)
CO2: 27 mmol/L (ref 22–32)
Calcium: 9.2 mg/dL (ref 8.9–10.3)
Chloride: 106 mmol/L (ref 98–111)
Creatinine, Ser: 0.7 mg/dL (ref 0.44–1.00)
GFR calc Af Amer: >60 mL/min (ref >60)
GFR calc non Af Amer: >60 mL/min (ref >60)
Glucose, Bld: 81 mg/dL (ref 70–99)
Potassium: 4.5 mmol/L (ref 3.5–5.1)
Sodium: 138 mmol/L (ref 135–145)
Total Bilirubin: 0.8 mg/dL (ref 0.3–1.2)
Total Protein: 6.8 g/dL (ref 6.5–8.1)

## 2020-03-07 LAB — SARS CORONAVIRUS 2 (TAT 6-24 HRS): SARS Coronavirus 2: NEGATIVE

## 2020-03-07 NOTE — Progress Notes (Signed)

## 2020-03-08 ENCOUNTER — Other Ambulatory Visit: Payer: Self-pay

## 2020-03-08 ENCOUNTER — Ambulatory Visit
Admission: RE | Admit: 2020-03-08 | Discharge: 2020-03-08 | Disposition: A | Discharge status: Home / Self Care | Payer: Medicaid Other | Source: Ambulatory Visit | Attending: Surgery | Admitting: Surgery

## 2020-03-08 DIAGNOSIS — D241 Benign neoplasm of right breast: Secondary | ICD-10-CM

## 2020-03-09 ENCOUNTER — Ambulatory Visit (HOSPITAL_BASED_OUTPATIENT_CLINIC_OR_DEPARTMENT_OTHER)
Admission: RE | Admit: 2020-03-09 | Discharge: 2020-03-09 | Disposition: A | Discharge status: Home / Self Care | Payer: Medicaid Other | Attending: Surgery | Admitting: Surgery

## 2020-03-09 ENCOUNTER — Ambulatory Visit (HOSPITAL_BASED_OUTPATIENT_CLINIC_OR_DEPARTMENT_OTHER): Payer: Medicaid Other | Admitting: Certified Registered"

## 2020-03-09 ENCOUNTER — Encounter (HOSPITAL_BASED_OUTPATIENT_CLINIC_OR_DEPARTMENT_OTHER)
Admission: RE | Disposition: A | Discharge status: Home / Self Care | Payer: Self-pay | Source: Home / Self Care | Attending: Surgery

## 2020-03-09 ENCOUNTER — Ambulatory Visit
Admission: RE | Admit: 2020-03-09 | Discharge: 2020-03-09 | Disposition: A | Discharge status: Home / Self Care | Payer: Medicaid Other | Source: Ambulatory Visit | Attending: Surgery | Admitting: Surgery

## 2020-03-09 ENCOUNTER — Other Ambulatory Visit: Payer: Self-pay

## 2020-03-09 ENCOUNTER — Encounter (HOSPITAL_BASED_OUTPATIENT_CLINIC_OR_DEPARTMENT_OTHER): Payer: Self-pay | Admitting: Surgery

## 2020-03-09 DIAGNOSIS — K219 Gastro-esophageal reflux disease without esophagitis: Secondary | ICD-10-CM | POA: Insufficient documentation

## 2020-03-09 DIAGNOSIS — F329 Major depressive disorder, single episode, unspecified: Secondary | ICD-10-CM | POA: Diagnosis not present

## 2020-03-09 DIAGNOSIS — Z79899 Other long term (current) drug therapy: Secondary | ICD-10-CM | POA: Insufficient documentation

## 2020-03-09 DIAGNOSIS — N6011 Diffuse cystic mastopathy of right breast: Secondary | ICD-10-CM | POA: Insufficient documentation

## 2020-03-09 DIAGNOSIS — D241 Benign neoplasm of right breast: Secondary | ICD-10-CM

## 2020-03-09 DIAGNOSIS — M329 Systemic lupus erythematosus, unspecified: Secondary | ICD-10-CM | POA: Insufficient documentation

## 2020-03-09 DIAGNOSIS — N6489 Other specified disorders of breast: Secondary | ICD-10-CM | POA: Diagnosis not present

## 2020-03-09 DIAGNOSIS — Z791 Long term (current) use of non-steroidal anti-inflammatories (NSAID): Secondary | ICD-10-CM | POA: Insufficient documentation

## 2020-03-09 HISTORY — PX: BREAST LUMPECTOMY WITH RADIOACTIVE SEED LOCALIZATION: SHX6424

## 2020-03-09 SURGERY — BREAST LUMPECTOMY WITH RADIOACTIVE SEED LOCALIZATION
Anesthesia: General | Site: Breast | Laterality: Right

## 2020-03-09 MED ORDER — DEXTROSE 5 % IV SOLN: 3.0000 g | INTRAVENOUS | Status: DC

## 2020-03-09 MED ORDER — IBUPROFEN 800 MG PO TABS: 800.0000 mg | ORAL_TABLET | Freq: Three times a day (TID) | ORAL | 0 refills | Status: DC | PRN

## 2020-03-09 MED ORDER — CHLORHEXIDINE GLUCONATE CLOTH 2 % EX PADS: 6.0000 | MEDICATED_PAD | Freq: Once | CUTANEOUS | Status: DC

## 2020-03-09 MED ORDER — PROPOFOL 10 MG/ML IV BOLUS
INTRAVENOUS | Status: AC
  Filled 2020-03-09: qty 20

## 2020-03-09 MED ORDER — MIDAZOLAM HCL 5 MG/5ML IJ SOLN
INTRAMUSCULAR | Status: DC | PRN
  Administered 2020-03-09: 2 mg via INTRAVENOUS

## 2020-03-09 MED ORDER — MIDAZOLAM HCL 2 MG/2ML IJ SOLN
INTRAMUSCULAR | Status: AC
  Filled 2020-03-09: qty 2

## 2020-03-09 MED ORDER — BUPIVACAINE HCL 0.25 % IJ SOLN
INTRAMUSCULAR | Status: DC | PRN
  Administered 2020-03-09: 9 mL

## 2020-03-09 MED ORDER — FENTANYL CITRATE (PF) 100 MCG/2ML IJ SOLN
25.0000 ug | INTRAMUSCULAR | Status: DC | PRN
  Administered 2020-03-09 (×2): 25 ug via INTRAVENOUS

## 2020-03-09 MED ORDER — HYDROCODONE-ACETAMINOPHEN 5-325 MG PO TABS: 1.0000 | ORAL_TABLET | Freq: Four times a day (QID) | ORAL | 0 refills | Status: DC | PRN

## 2020-03-09 MED ORDER — FENTANYL CITRATE (PF) 100 MCG/2ML IJ SOLN
INTRAMUSCULAR | Status: AC
  Filled 2020-03-09: qty 2

## 2020-03-09 MED ORDER — CEFAZOLIN SODIUM-DEXTROSE 2-3 GM-%(50ML) IV SOLR
INTRAVENOUS | Status: DC | PRN
  Administered 2020-03-09: 2 g via INTRAVENOUS

## 2020-03-09 MED ORDER — LACTATED RINGERS IV SOLN: INTRAVENOUS | Status: DC

## 2020-03-09 MED ORDER — CEFAZOLIN SODIUM-DEXTROSE 2-4 GM/100ML-% IV SOLN
INTRAVENOUS | Status: AC
  Filled 2020-03-09: qty 100

## 2020-03-09 MED ORDER — ONDANSETRON HCL 4 MG/2ML IJ SOLN
INTRAMUSCULAR | Status: DC | PRN
  Administered 2020-03-09: 4 mg via INTRAVENOUS

## 2020-03-09 MED ORDER — PROPOFOL 10 MG/ML IV BOLUS
INTRAVENOUS | Status: DC | PRN
  Administered 2020-03-09: 170 mg via INTRAVENOUS

## 2020-03-09 MED ORDER — LIDOCAINE HCL (CARDIAC) PF 100 MG/5ML IV SOSY
PREFILLED_SYRINGE | INTRAVENOUS | Status: DC | PRN
  Administered 2020-03-09: 80 mg via INTRAVENOUS

## 2020-03-09 MED ORDER — DEXAMETHASONE SODIUM PHOSPHATE 10 MG/ML IJ SOLN
INTRAMUSCULAR | Status: DC | PRN
  Administered 2020-03-09: 5 mg via INTRAVENOUS

## 2020-03-09 MED ORDER — FENTANYL CITRATE (PF) 100 MCG/2ML IJ SOLN
INTRAMUSCULAR | Status: DC | PRN
  Administered 2020-03-09 (×2): 25 ug via INTRAVENOUS

## 2020-03-09 MED ORDER — PROMETHAZINE HCL 25 MG/ML IJ SOLN: 6.2500 mg | INTRAMUSCULAR | Status: DC | PRN

## 2020-03-09 MED ORDER — EPHEDRINE SULFATE 50 MG/ML IJ SOLN
INTRAMUSCULAR | Status: DC | PRN
  Administered 2020-03-09 (×2): 5 mg via INTRAVENOUS

## 2020-03-09 SURGICAL SUPPLY — 52 items
APL PRP STRL LF DISP 70% ISPRP (MISCELLANEOUS) ×1
APPLIER CLIP 9.375 MED OPEN (MISCELLANEOUS)
BINDER BREAST LRG (GAUZE/BANDAGES/DRESSINGS) IMPLANT
BINDER BREAST MEDIUM (GAUZE/BANDAGES/DRESSINGS) IMPLANT
BINDER BREAST XLRG (GAUZE/BANDAGES/DRESSINGS) IMPLANT
BINDER BREAST XXLRG (GAUZE/BANDAGES/DRESSINGS) IMPLANT
BLADE SURG 15 STRL LF DISP TIS (BLADE) ×1 IMPLANT
BLADE SURG 15 STRL SS (BLADE) ×3
CANISTER SUC SOCK COL 7IN (MISCELLANEOUS) IMPLANT
CANISTER SUCT 1200ML W/VALVE (MISCELLANEOUS) IMPLANT
CHLORAPREP W/TINT 26 (MISCELLANEOUS) ×3 IMPLANT
CLIP APPLIE 9.375 MED OPEN (MISCELLANEOUS) IMPLANT
COVER BACK TABLE 60X90IN (DRAPES) ×3 IMPLANT
COVER MAYO STAND STRL (DRAPES) ×3 IMPLANT
COVER PROBE W GEL 5X96 (DRAPES) ×3 IMPLANT
COVER WAND RF STERILE (DRAPES) IMPLANT
DECANTER SPIKE VIAL GLASS SM (MISCELLANEOUS) IMPLANT
DERMABOND ADVANCED (GAUZE/BANDAGES/DRESSINGS) ×2
DERMABOND ADVANCED .7 DNX12 (GAUZE/BANDAGES/DRESSINGS) ×1 IMPLANT
DRAPE LAPAROSCOPIC ABDOMINAL (DRAPES) IMPLANT
DRAPE LAPAROTOMY 100X72 PEDS (DRAPES) ×3 IMPLANT
DRAPE UTILITY XL STRL (DRAPES) ×3 IMPLANT
ELECT COATED BLADE 2.86 ST (ELECTRODE) ×3 IMPLANT
ELECT REM PT RETURN 9FT ADLT (ELECTROSURGICAL) ×3
ELECTRODE REM PT RTRN 9FT ADLT (ELECTROSURGICAL) ×1 IMPLANT
GLOVE BIO SURGEON STRL SZ7 (GLOVE) ×4 IMPLANT
GLOVE BIOGEL PI IND STRL 7.5 (GLOVE) IMPLANT
GLOVE BIOGEL PI IND STRL 8 (GLOVE) ×1 IMPLANT
GLOVE BIOGEL PI INDICATOR 7.5 (GLOVE) ×2
GLOVE BIOGEL PI INDICATOR 8 (GLOVE) ×2
GLOVE ECLIPSE 8.0 STRL XLNG CF (GLOVE) ×3 IMPLANT
GOWN STRL REUS W/ TWL LRG LVL3 (GOWN DISPOSABLE) ×2 IMPLANT
GOWN STRL REUS W/TWL LRG LVL3 (GOWN DISPOSABLE) ×6
HEMOSTAT ARISTA ABSORB 3G PWDR (HEMOSTASIS) IMPLANT
HEMOSTAT SNOW SURGICEL 2X4 (HEMOSTASIS) IMPLANT
KIT MARKER MARGIN INK (KITS) ×3 IMPLANT
NDL HYPO 25X1 1.5 SAFETY (NEEDLE) ×1 IMPLANT
NEEDLE HYPO 25X1 1.5 SAFETY (NEEDLE) ×3 IMPLANT
NS IRRIG 1000ML POUR BTL (IV SOLUTION) ×3 IMPLANT
PACK BASIN DAY SURGERY FS (CUSTOM PROCEDURE TRAY) ×3 IMPLANT
PENCIL SMOKE EVACUATOR (MISCELLANEOUS) ×3 IMPLANT
SLEEVE SCD COMPRESS KNEE MED (MISCELLANEOUS) ×3 IMPLANT
SPONGE LAP 4X18 RFD (DISPOSABLE) ×3 IMPLANT
SUT MNCRL AB 4-0 PS2 18 (SUTURE) ×3 IMPLANT
SUT SILK 2 0 SH (SUTURE) IMPLANT
SUT VICRYL 3-0 CR8 SH (SUTURE) ×3 IMPLANT
SYR CONTROL 10ML LL (SYRINGE) ×3 IMPLANT
TOWEL GREEN STERILE FF (TOWEL DISPOSABLE) ×3 IMPLANT
TRAY FAXITRON CT DISP (TRAY / TRAY PROCEDURE) ×3 IMPLANT
TUBE CONNECTING 20'X1/4 (TUBING)
TUBE CONNECTING 20X1/4 (TUBING) IMPLANT
YANKAUER SUCT BULB TIP NO VENT (SUCTIONS) IMPLANT

## 2020-03-09 NOTE — Transfer of Care (Signed)
Immediate Anesthesia Transfer of Care Note  Patient: Evelyn [name]  Procedure(s) Performed: RIGHT BREAST LUMPECTOMY WITH RADIOACTIVE SEED LOCALIZATION (Right Breast)  Patient Location: PACU  Anesthesia Type:General  Level of Consciousness: awake, alert  and oriented  Airway & Oxygen Therapy: Patient Spontanous Breathing and Patient connected to face mask oxygen  Post-op Assessment: Report given to RN and Post -op Vital signs reviewed and stable  Post vital signs: Reviewed and stable  Last Vitals:  Vitals Value Taken Time  BP 117/47 03/09/20 1554  Temp    Pulse 81 03/09/20 1556  Resp 16 03/09/20 1556  SpO2 100 % 03/09/20 1556  Vitals shown include unvalidated device data.  Last Pain:  Vitals:   03/09/20 1315  TempSrc: Tympanic  PainSc: 0-No pain         Complications: No apparent anesthesia complications

## 2020-03-09 NOTE — Discharge Instructions (Signed)
[facility] °Office Phone Number [phone] ° °BREAST BIOPSY/ PARTIAL MASTECTOMY: POST OP INSTRUCTIONS ° °Always review your discharge instruction sheet given to you by the facility where your surgery was performed. ° °IF YOU HAVE DISABILITY OR FAMILY LEAVE FORMS, YOU MUST BRING THEM TO THE OFFICE FOR PROCESSING.  DO NOT GIVE THEM TO YOUR DOCTOR. ° °1. A prescription for pain medication may be given to you upon discharge.  Take your pain medication as prescribed, if needed.  If narcotic pain medicine is not needed, then you may take acetaminophen (Tylenol) or ibuprofen (Advil) as needed. °2. Take your usually prescribed medications unless otherwise directed °3. If you need a refill on your pain medication, please contact your pharmacy.  They will contact our office to request authorization.  Prescriptions will not be filled after 5pm or on week-ends. °4. You should eat very light the first 24 hours after surgery, such as soup, crackers, pudding, etc.  Resume your normal diet the day after surgery. °5. Most patients will experience some swelling and bruising in the breast.  Ice packs and a good support bra will help.  Swelling and bruising can take several days to resolve.  °6. It is common to experience some constipation if taking pain medication after surgery.  Increasing fluid intake and taking a stool softener will usually help or prevent this problem from occurring.  A mild laxative (Milk of Magnesia or Miralax) should be taken according to package directions if there are no bowel movements after 48 hours. °7. Unless discharge instructions indicate otherwise, you may remove your bandages 24-48 hours after surgery, and you may shower at that time.  You may have steri-strips (small skin tapes) in place directly over the incision.  These strips should be left on the skin for 7-10 days.  If your surgeon used skin glue on the incision, you may shower in 24 hours.  The glue will flake off over the  next 2-3 weeks.  Any sutures or staples will be removed at the office during your follow-up visit. °8. ACTIVITIES:  You may resume regular daily activities (gradually increasing) beginning the next day.  Wearing a good support bra or sports bra minimizes pain and swelling.  You may have sexual intercourse when it is comfortable. °a. You may drive when you no longer are taking prescription pain medication, you can comfortably wear a seatbelt, and you can safely maneuver your car and apply brakes. °b. RETURN TO WORK:  ______________________________________________________________________________________ °9. You should see your doctor in the office for a follow-up appointment approximately two weeks after your surgery.  Your doctor’s nurse will typically make your follow-up appointment when she calls you with your pathology report.  Expect your pathology report 2-3 business days after your surgery.  You may call to check if you do not hear from us after three days. °10. OTHER INSTRUCTIONS: _______________________________________________________________________________________________ _____________________________________________________________________________________________________________________________________ °_____________________________________________________________________________________________________________________________________ °_____________________________________________________________________________________________________________________________________ ° °WHEN TO CALL YOUR DOCTOR: °1. Fever over 101.0 °2. Nausea and/or vomiting. °3. Extreme swelling or bruising. °4. Continued bleeding from incision. °5. Increased pain, redness, or drainage from the incision. ° °The clinic staff is available to answer your questions during regular business hours.  Please don’t hesitate to call and ask to speak to one of the nurses for clinical concerns.  If you have a medical emergency, go to the nearest  emergency room or call 911.  A surgeon from [facility] is always on call at the hospital. ° °For further questions, please visit [email or web address]  ° ° ° ° °  Post Anesthesia Home Care Instructions ° °Activity: °Get plenty of rest for the remainder of the day. A responsible individual must stay with you for 24 hours following the procedure.  °For the next 24 hours, DO NOT: °-Drive a car °-Operate machinery °-Drink alcoholic beverages °-Take any medication unless instructed by your physician °-Make any legal decisions or sign important papers. ° °Meals: °Start with liquid foods such as gelatin or soup. Progress to regular foods as tolerated. Avoid greasy, spicy, heavy foods. If nausea and/or vomiting occur, drink only clear liquids until the nausea and/or vomiting subsides. Call your physician if vomiting continues. ° °Special Instructions/Symptoms: °Your throat may feel dry or sore from the anesthesia or the breathing tube placed in your throat during surgery. If this causes discomfort, gargle with warm salt water. The discomfort should disappear within 24 hours. ° °If you had a scopolamine patch placed behind your ear for the management of post- operative nausea and/or vomiting: ° °1. The medication in the patch is effective for 72 hours, after which it should be removed.  Wrap patch in a tissue and discard in the trash. Wash hands thoroughly with soap and water. °2. You may remove the patch earlier than 72 hours if you experience unpleasant side effects which may include dry mouth, dizziness or visual disturbances. °3. Avoid touching the patch. Wash your hands with soap and water after contact with the patch. °  ° °

## 2020-03-09 NOTE — Op Note (Signed)
Preoperative diagnosis: Right breast papilloma  Postoperative diagnosis: Same  Procedure: Right breast seed localized lumpectomy  Surgeon: Erroll Luna, MD  Anesthesia: LMA with 0.25% local plain Marcaine  Drains: None  Specimen: Right breast tissue with seed and clip verified by Faxitron  EBL: 20 cc  IV fluids: Per anesthesia record  Indications for procedure: The patient is a 34 year old female found to have a mammographic abnormality after right nipple discharge.  Core biopsy was done to a subareolar mass on the right which showed papilloma.  We discussed pros and cons of excision as well as potential upgrade risk of 20% with these lesions in certain patients.  We discussed observation as well.  She opted for right breast lumpectomy for papilloma.The procedure has been discussed with the patient. Alternatives to surgery have been discussed with the patient.  Risks of surgery include bleeding,  Infection,  Seroma formation, death,  and the need for further surgery.   The patient understands and wishes to proceed.   Description of procedure: The patient was met in the holding area and questions were answered.  Neoprobe was used to identify the seed in the right breast.  This was marked as the correct site.  Questions were answered.  She was then taken back to the operating.  She is placed supine upon the OR table.  After induction of general esthesia the right breast was prepped and draped in sterile fashion.  Timeout was performed.  Proper site, patient and procedure were verified.  She received appropriate preoperative antibiotics.  Neoprobe was used to identify the mass in the subareolar position on the right.  Films were available for review.  A 3 cm incision made along the inferior border of the nipple areolar complex.  Dissection was carried into the skin in the seed and clip were localized.  These were excised with a grossly negative margin.  The Faxitron image revealed the seed and  clip to be present.  Hemostasis achieved with cautery.  Wound closed with 3-0 Vicryl for Monocryl.  Dermabond applied.  All counts found to be correct.  The patient was awoke extubated taken recovery in satisfactory condition.

## 2020-03-09 NOTE — Anesthesia Postprocedure Evaluation (Signed)
Anesthesia Post Note  Patient: Evelyn [name]  Procedure(s) Performed: RIGHT BREAST LUMPECTOMY WITH RADIOACTIVE SEED LOCALIZATION (Right Breast)     Patient location during evaluation: PACU Anesthesia Type: General Level of consciousness: awake and alert Pain management: pain level controlled Vital Signs Assessment: post-procedure vital signs reviewed and stable Respiratory status: spontaneous breathing, nonlabored ventilation and respiratory function stable Cardiovascular status: blood pressure returned to baseline and stable Postop Assessment: no apparent nausea or vomiting Anesthetic complications: no    Last Vitals:  Vitals:   03/09/20 1700 03/09/20 1718  BP:  113/74  Pulse: 72 74  Resp: 16 17  Temp:    SpO2: 100% 100%    Last Pain:  Vitals:   03/09/20 1718  TempSrc:   PainSc: 2                  Catalina Gravel

## 2020-03-09 NOTE — Anesthesia Procedure Notes (Signed)
Procedure Name: LMA Insertion Date/Time: 03/09/2020 3:09 PM Performed by: Lavonia Dana, CRNA Pre-anesthesia Checklist: Patient identified, Emergency Drugs available, Suction available and Patient being monitored Patient Re-evaluated:Patient Re-evaluated prior to induction Oxygen Delivery Method: Circle system utilized Preoxygenation: Pre-oxygenation with 100% oxygen Induction Type: IV induction Ventilation: Mask ventilation without difficulty LMA: LMA inserted LMA Size: 4.0 Number of attempts: 1 Airway Equipment and Method: Bite block Placement Confirmation: positive ETCO2 Tube secured with: Tape Dental Injury: Teeth and Oropharynx as per pre-operative assessment

## 2020-03-09 NOTE — Anesthesia Preprocedure Evaluation (Addendum)
Anesthesia Evaluation  Patient identified by MRN, date of birth, ID band Patient awake    Reviewed: Allergy & Precautions, NPO status , Patient's Chart, lab work & pertinent test results  History of Anesthesia Complications Negative for: history of anesthetic complications  Airway Mallampati: I  TM Distance: >3 FB Neck ROM: Full    Dental  (+) Teeth Intact, Dental Advisory Given   Pulmonary neg pulmonary ROS,    Pulmonary exam normal breath sounds clear to auscultation       Cardiovascular Exercise Tolerance: Good negative cardio ROS Normal cardiovascular exam Rhythm:Regular Rate:Normal     Neuro/Psych PSYCHIATRIC DISORDERS Depression negative neurological ROS     GI/Hepatic Neg liver ROS, GERD  Medicated and Controlled,  Endo/Other  SLE  Renal/GU negative Renal ROS     Musculoskeletal negative musculoskeletal ROS (+)   Abdominal   Peds  Hematology negative hematology ROS (+)   Anesthesia Other Findings Day of surgery medications reviewed with the patient.  RIGHT BREAST PAPILLOMA  Reproductive/Obstetrics negative OB ROS                            Anesthesia Physical Anesthesia Plan  ASA: II  Anesthesia Plan: General   Post-op Pain Management:    Induction: Intravenous  PONV Risk Score and Plan: 3 and Midazolam, Dexamethasone, Ondansetron and Diphenhydramine  Airway Management Planned: LMA  Additional Equipment:   Intra-op Plan:   Post-operative Plan: Extubation in OR  Informed Consent: I have reviewed the patients History and Physical, chart, labs and discussed the procedure including the risks, benefits and alternatives for the proposed anesthesia with the patient or authorized representative who has indicated his/her understanding and acceptance.     Dental advisory given  Plan Discussed with: CRNA  Anesthesia Plan Comments:         Anesthesia Quick  Evaluation

## 2020-03-09 NOTE — Interval H&P Note (Signed)
History and Physical Interval Note:  [id number] PM  Evelyn [name]  has presented today for surgery, with the diagnosis of RIGHT BREAST PAPILLOMA.  The various methods of treatment have been discussed with the patient and family. After consideration of risks, benefits and other options for treatment, the patient has consented to  Procedure(s): RIGHT BREAST LUMPECTOMY WITH RADIOACTIVE SEED LOCALIZATION (Right) as a surgical intervention.  The patient's history has been reviewed, patient examined, no change in status, stable for surgery.  I have reviewed the patient's chart and labs.  Questions were answered to the patient's satisfaction.     [place]

## 2020-03-10 ENCOUNTER — Encounter: Payer: Self-pay | Admitting: *Deleted

## 2020-03-11 LAB — SURGICAL PATHOLOGY

## 2020-03-19 ENCOUNTER — Other Ambulatory Visit (HOSPITAL_COMMUNITY): Payer: Medicaid Other

## 2020-05-27 ENCOUNTER — Other Ambulatory Visit: Payer: Self-pay | Admitting: Family Medicine

## 2020-08-10 ENCOUNTER — Other Ambulatory Visit: Payer: Self-pay | Admitting: Critical Care Medicine

## 2020-08-10 ENCOUNTER — Ambulatory Visit (INDEPENDENT_AMBULATORY_CARE_PROVIDER_SITE_OTHER): Payer: Medicaid Other

## 2020-08-10 ENCOUNTER — Other Ambulatory Visit: Payer: Self-pay

## 2020-08-10 ENCOUNTER — Ambulatory Visit (INDEPENDENT_AMBULATORY_CARE_PROVIDER_SITE_OTHER): Payer: Medicaid Other | Admitting: Orthopaedic Surgery

## 2020-08-10 ENCOUNTER — Encounter: Payer: Self-pay | Admitting: Orthopaedic Surgery

## 2020-08-10 ENCOUNTER — Other Ambulatory Visit: Payer: Medicaid Other

## 2020-08-10 VITALS — Ht 61.0 in | Wt 141.0 lb

## 2020-08-10 DIAGNOSIS — G8929 Other chronic pain: Secondary | ICD-10-CM

## 2020-08-10 DIAGNOSIS — M25562 Pain in left knee: Secondary | ICD-10-CM

## 2020-08-10 DIAGNOSIS — Z20822 Contact with and (suspected) exposure to covid-19: Secondary | ICD-10-CM

## 2020-08-10 DIAGNOSIS — M79644 Pain in right finger(s): Secondary | ICD-10-CM

## 2020-08-10 MED ORDER — BUPIVACAINE HCL 0.25 % IJ SOLN
2.0000 mL | INTRAMUSCULAR | Status: AC | PRN
  Administered 2020-08-10: 2 mL via INTRA_ARTICULAR

## 2020-08-10 MED ORDER — METHYLPREDNISOLONE ACETATE 40 MG/ML IJ SUSP
40.0000 mg | INTRAMUSCULAR | Status: AC | PRN
  Administered 2020-08-10: 40 mg via INTRA_ARTICULAR

## 2020-08-10 MED ORDER — LIDOCAINE HCL 1 % IJ SOLN
2.0000 mL | INTRAMUSCULAR | Status: AC | PRN
  Administered 2020-08-10: 2 mL

## 2020-08-10 MED ORDER — HYDROCODONE-ACETAMINOPHEN 5-325 MG PO TABS: 1.0000 | ORAL_TABLET | Freq: Every day | ORAL | 0 refills | Status: DC | PRN

## 2020-08-10 NOTE — Progress Notes (Signed)
Office Visit Note   Patient: Evelyn [name]           Date of Birth: 03/22/1986           MRN: [id number] Visit Date: 08/10/2020              Requested by: Nickola Major, MD [address] [place],  [place] [address] PCP: Nickola Major, MD   Assessment & Plan: Visit Diagnoses:  1. Chronic pain of left knee   2. Finger pain, right     Plan: Impression is questionable left knee medial meniscus tear and right index finger PIP joint pain with possible inflammatory component.  In regards to the knee, we will inject this with cortisone today.  She will follow up with [place] as needed.  In regards to the finger, she has tried oral and topical anti-inflammatories without relief of symptoms.  She does follow-up with her rheumatologist soon so she will mention the possibility of rheumatoid arthritis to them.  Follow-Up Instructions: Return if symptoms worsen or fail to improve.   Orders:  Orders Placed This Encounter  Procedures  . Large Joint Inj: L knee  . XR KNEE 3 VIEW LEFT  . XR Finger Index Right   Meds ordered this encounter  Medications  . HYDROcodone-acetaminophen (NORCO) 5-325 MG tablet    Sig: Take 1-2 tablets by mouth daily as needed for moderate pain.    Dispense:  14 tablet    Refill:  0      Procedures: Large Joint Inj: L knee on 08/10/2020 9:26 AM Indications: pain Details: 22 G needle, anterolateral approach Medications: 2 mL lidocaine 1 %; 2 mL bupivacaine 0.25 %; 40 mg methylPREDNISolone acetate 40 MG/ML      Clinical Data: No additional findings.   Subjective: Chief Complaint  Patient presents with  . Left Knee - Pain  . Right Index Finger - Pain    HPI patient is a pleasant 34 year old female who comes in today with left knee and right index finger pain.  In regards to her left knee, she has had pain here for the past 3 weeks without a specific injury.  The pain she has to the medial aspect.  She notes slight instability but no  mechanical symptoms.  Walking, pivoting and flexion of the knee seem to aggravate her symptoms most.  She has been wearing a hinged knee brace, using ice and elevating as well as taking naproxen without significant relief of symptoms.  In regards to her right index finger, she has had pain here for over a month.  It is all to the PIP joint.  She describes this as a constant ache worse with flexion of that joint.  No numbness, tingling or burning.  No previous injury or change in activity.  Of note, she does have lupus and is being followed by rheumatology.  She does not have rheumatoid arthritis at this point.  Review of Systems as detailed in HPI.  All others reviewed and are negative.   Objective: Vital Signs: Ht $RemoveB'5\' 1"'WXvCebZs$  (1.549 m)   Wt 141 lb (64 kg)   BMI 26.64 kg/m   Physical Exam well-developed well-nourished female no acute distress.  Alert and oriented x3.  Ortho Exam left knee exam shows a trace effusion.  Range of motion 0 to 95 degrees.  Moderate medial joint line tenderness.  Ligaments are stable.  She is neurovascular intact distally.  She does have a minimally positive straight leg  raise.  Examination of her right index finger reveals no tenderness to the PIP joint.  No swelling.  She has increased pain with flexion of the PIP joint.  She is neurovascular intact distally.  Specialty Comments:  No specialty comments available.  Imaging: XR Finger Index Right  Result Date: 08/10/2020 No acute or structural abnormalities  XR KNEE 3 VIEW LEFT  Result Date: 08/10/2020 No acute or structural abnormalities    PMFS History: Patient Active Problem List   Diagnosis Date Noted  . Pain of left side of body 07/02/2019  . Paresthesia 07/02/2019  . Vulvovaginal candidiasis 06/23/2019  . Gastroesophageal reflux disease 01/27/2019  . Systemic lupus erythematosus ([place]) 11/19/2018  . Raynaud's disease without gangrene 11/19/2018  . High risk medication use 11/19/2018  . History of  bilateral carpal tunnel release 11/19/2018  . Autoimmune disease ([place]) 10/08/2018  . Carpal tunnel syndrome, right upper limb 09/26/2018  . Radicular pain in right arm 09/16/2018  . History of pregnancy induced hypertension 08/03/2014  . History of pre-eclampsia 08/03/2014  . Chronic back pain 01/14/2014  . Allergic rhinitis 01/14/2014   Past Medical History:  Diagnosis Date  . Depression    post partum  . GERD (gastroesophageal reflux disease)   . Systemic lupus erythematosus ([place])   . Umbilical hernia     Family History  Problem Relation Age of Onset  . Healthy Mother   . Healthy Sister   . Healthy Brother   . Asthma Son   . Healthy Son   . Healthy Son   . Healthy Son   . Other Father        never met her father - unsure of his history  . Healthy Daughter   . Breast cancer Cousin 26       undergoing chemo/radiation  . Heart disease Neg Hx   . Neurologic Disorder Neg Hx   . Colon cancer Neg Hx   . Esophageal cancer Neg Hx     Past Surgical History:  Procedure Laterality Date  . BREAST LUMPECTOMY WITH RADIOACTIVE SEED LOCALIZATION Right 03/09/2020   Procedure: RIGHT BREAST LUMPECTOMY WITH RADIOACTIVE SEED LOCALIZATION;  Surgeon: Erroll Luna, MD;  Location: [facility];  Service: General;  Laterality: Right;  . CARPAL TUNNEL RELEASE Right 09/26/2018   Procedure: RIGHT CARPAL TUNNEL RELEASE;  Surgeon: Leandrew Koyanagi, MD;  Location: [facility];  Service: Orthopedics;  Laterality: Right;  . CARPAL TUNNEL RELEASE Left 2017  . CESAREAN SECTION N/A 08/05/2014   Procedure: CESAREAN SECTION;  Surgeon: Jerelyn Charles, MD;  Location: [facility];  Service: Obstetrics;  Laterality: N/A;  . KNEE SURGERY Left 2000  . LAPAROSCOPIC BILATERAL SALPINGECTOMY N/A 12/13/2015   Procedure: LAPAROSCOPIC BILATERAL SALPINGECTOMY;  Surgeon: Emily Filbert, MD;  Location: [facility];  Service: Gynecology;  Laterality: N/A;  . PERINEOPLASTY N/A 02/20/2016   Procedure: PERINEOPLASTY;   Surgeon: Emily Filbert, MD;  Location: [facility];  Service: Gynecology;  Laterality: N/A;  . PUBOVAGINAL SLING N/A 12/13/2015   Procedure: Gaynelle Arabian;  Surgeon: Emily Filbert, MD;  Location: [facility];  Service: Gynecology;  Laterality: N/A;  . RECTOCELE REPAIR N/A 02/20/2016   Procedure: POSTERIOR REPAIR (RECTOCELE);  Surgeon: Emily Filbert, MD;  Location: [facility];  Service: Gynecology;  Laterality: N/A;  . UMBILICAL HERNIA REPAIR  2011   Social History   Occupational History  . Occupation: Magazine features editor - part-time  Tobacco Use  . Smoking status: Never Smoker  .  Smokeless tobacco: Never Used  Vaping Use  . Vaping Use: Never used  Substance and Sexual Activity  . Alcohol use: Not Currently  . Drug use: No  . Sexual activity: Yes    Partners: Male    Birth control/protection: Surgical, I.U.D.

## 2020-08-12 LAB — NOVEL CORONAVIRUS, NAA: SARS-CoV-2, NAA: NOT DETECTED

## 2020-08-12 LAB — SARS-COV-2, NAA 2 DAY TAT

## 2021-01-23 ENCOUNTER — Encounter (HOSPITAL_COMMUNITY): Payer: Self-pay | Admitting: Emergency Medicine

## 2021-01-23 ENCOUNTER — Emergency Department (HOSPITAL_COMMUNITY): Payer: Medicaid Other

## 2021-01-23 ENCOUNTER — Emergency Department (HOSPITAL_COMMUNITY)
Admission: EM | Admit: 2021-01-23 | Discharge: 2021-01-23 | Disposition: A | Discharge status: Home / Self Care | Payer: Medicaid Other | Attending: Emergency Medicine | Admitting: Emergency Medicine

## 2021-01-23 DIAGNOSIS — Y9323 Activity, snow (alpine) (downhill) skiing, snow boarding, sledding, tobogganing and snow tubing: Secondary | ICD-10-CM | POA: Diagnosis not present

## 2021-01-23 DIAGNOSIS — S0990XA Unspecified injury of head, initial encounter: Secondary | ICD-10-CM

## 2021-01-23 DIAGNOSIS — Z79899 Other long term (current) drug therapy: Secondary | ICD-10-CM | POA: Diagnosis not present

## 2021-01-23 DIAGNOSIS — S60222A Contusion of left hand, initial encounter: Secondary | ICD-10-CM | POA: Diagnosis not present

## 2021-01-23 DIAGNOSIS — W01198A Fall on same level from slipping, tripping and stumbling with subsequent striking against other object, initial encounter: Secondary | ICD-10-CM | POA: Diagnosis not present

## 2021-01-23 DIAGNOSIS — S060X0A Concussion without loss of consciousness, initial encounter: Secondary | ICD-10-CM | POA: Diagnosis not present

## 2021-01-23 DIAGNOSIS — M542 Cervicalgia: Secondary | ICD-10-CM | POA: Diagnosis not present

## 2021-01-23 NOTE — ED Triage Notes (Signed)
Pt states she was snow boarding yesterday around 3pm and she fell backwards hitting the back of her head causing the helmet to crack, she is now having a headache on right side of head and in the back of her head. She is also having a lot of neck stiffness. Her right eye "feels like a lazy eye" denies any vision change.

## 2021-01-23 NOTE — Discharge Instructions (Addendum)
Please recheck with your doctor this week if any continued head aches Avoid exertional physical activities or situations where you could re-injure your head.

## 2021-01-23 NOTE — ED Notes (Signed)
Discharge paperwork given to pt. Pt agreeable to discharge and understands instructions. Esignature pad not working.

## 2021-01-23 NOTE — ED Notes (Signed)
Pt transported to CT ?

## 2021-01-23 NOTE — ED Provider Notes (Signed)
[facility] EMERGENCY DEPARTMENT Provider Note   CSN: [id number] Arrival date & time: 01/23/21  1530     History Chief Complaint  Patient presents with  . Head Injury    Evelyn [name] is a 35 y.o. female.  HPI 35 year old female presents today complaining of headache and neck pain after fall snowboarding yesterday.  She history is since she does not know what she struck her head on but she did cracked the helmet.  She not have loss of consciousness.  She is having headache more on the right side today with some radiation down the right side of her neck.  She is not having numbness or tingling.  She has some pain on the lateral aspect of her left hand where there is a contusion.  She denies any lower extremity injury and has been up and walking without difficulty.  She denies any thoracic, chest, abdominal, or pelvic pain.    Past Medical History:  Diagnosis Date  . Depression    post partum  . GERD (gastroesophageal reflux disease)   . Systemic lupus erythematosus ([place])   . Umbilical hernia     Patient Active Problem List   Diagnosis Date Noted  . Pain of left side of body 07/02/2019  . Paresthesia 07/02/2019  . Vulvovaginal candidiasis 06/23/2019  . Gastroesophageal reflux disease 01/27/2019  . Systemic lupus erythematosus ([place]) 11/19/2018  . Raynaud's disease without gangrene 11/19/2018  . High risk medication use 11/19/2018  . History of bilateral carpal tunnel release 11/19/2018  . Autoimmune disease ([place]) 10/08/2018  . Carpal tunnel syndrome, right upper limb 09/26/2018  . Radicular pain in right arm 09/16/2018  . History of pregnancy induced hypertension 08/03/2014  . History of pre-eclampsia 08/03/2014  . Chronic back pain 01/14/2014  . Allergic rhinitis 01/14/2014    Past Surgical History:  Procedure Laterality Date  . BREAST LUMPECTOMY WITH RADIOACTIVE SEED LOCALIZATION Right 03/09/2020   Procedure: RIGHT BREAST LUMPECTOMY WITH  RADIOACTIVE SEED LOCALIZATION;  Surgeon: Erroll Luna, MD;  Location: [facility];  Service: General;  Laterality: Right;  . CARPAL TUNNEL RELEASE Right 09/26/2018   Procedure: RIGHT CARPAL TUNNEL RELEASE;  Surgeon: Leandrew Koyanagi, MD;  Location: [facility];  Service: Orthopedics;  Laterality: Right;  . CARPAL TUNNEL RELEASE Left 2017  . CESAREAN SECTION N/A 08/05/2014   Procedure: CESAREAN SECTION;  Surgeon: Jerelyn Charles, MD;  Location: [facility];  Service: Obstetrics;  Laterality: N/A;  . KNEE SURGERY Left 2000  . LAPAROSCOPIC BILATERAL SALPINGECTOMY N/A 12/13/2015   Procedure: LAPAROSCOPIC BILATERAL SALPINGECTOMY;  Surgeon: Emily Filbert, MD;  Location: [facility];  Service: Gynecology;  Laterality: N/A;  . PERINEOPLASTY N/A 02/20/2016   Procedure: PERINEOPLASTY;  Surgeon: Emily Filbert, MD;  Location: [facility];  Service: Gynecology;  Laterality: N/A;  . PUBOVAGINAL SLING N/A 12/13/2015   Procedure: Gaynelle Arabian;  Surgeon: Emily Filbert, MD;  Location: [facility];  Service: Gynecology;  Laterality: N/A;  . RECTOCELE REPAIR N/A 02/20/2016   Procedure: POSTERIOR REPAIR (RECTOCELE);  Surgeon: Emily Filbert, MD;  Location: [facility];  Service: Gynecology;  Laterality: N/A;  . UMBILICAL HERNIA REPAIR  2011     OB History    Gravida  4   Para  4   Term  2   Preterm  2   AB  0   Living  4     SAB  0   IAB  0   Ectopic  0  Multiple  0   Live Births  4           Family History  Problem Relation Age of Onset  . Healthy Mother   . Healthy Sister   . Healthy Brother   . Asthma Son   . Healthy Son   . Healthy Son   . Healthy Son   . Other Father        never met her father - unsure of his history  . Healthy Daughter   . Breast cancer Cousin 26       undergoing chemo/radiation  . Heart disease Neg Hx   . Neurologic Disorder Neg Hx   . Colon cancer Neg Hx   . Esophageal cancer Neg Hx     Social History   Tobacco Use  . Smoking status: Never Smoker  .  Smokeless tobacco: Never Used  Vaping Use  . Vaping Use: Never used  Substance Use Topics  . Alcohol use: Not Currently  . Drug use: No    Home Medications Prior to Admission medications   Medication Sig Start Date End Date Taking? Authorizing Provider  Cholecalciferol (VITAMIN D) 50 MCG (2000 UT) CAPS Take by mouth daily.    [provider]  HYDROcodone-acetaminophen (NORCO) 5-325 MG tablet Take 1-2 tablets by mouth daily as needed for moderate pain. 08/10/20   Aundra Dubin, PA-C  hydrocortisone 2.5 % cream APP EXT AA BID FOR FACIAL RASH 12/15/18   [provider]  hydroxychloroquine (PLAQUENIL) 200 MG tablet Take 1 tablet by mouth twice daily Monday through Friday only. 10/26/19   Bo Merino, MD  ibuprofen (ADVIL) 800 MG tablet Take 1 tablet (800 mg total) by mouth every 8 (eight) hours as needed. 03/09/20   Erroll Luna, MD  LEVONORGESTREL IU 1 each by Intrauterine route once.  02/07/17   [provider]  pantoprazole (PROTONIX) 40 MG tablet Take 1 tablet (40 mg total) by mouth daily before breakfast. 01/27/19   Gatha Mayer, MD  pregabalin (LYRICA) 50 MG capsule Take 100 mg by mouth daily.  12/04/19 03/01/20  [provider]  triamcinolone (KENALOG) 0.1 % paste Place onto teeth. 10/20/19   [provider]  valACYclovir (VALTREX) 500 MG tablet TAKE 1 TABLET(500 MG) BY MOUTH TWICE DAILY Patient taking differently: as needed.  06/24/19   Caren Macadam, MD  venlafaxine XR (EFFEXOR-XR) 150 MG 24 hr capsule TAKE 1 CAPSULE(150 MG) BY MOUTH DAILY 10/27/19   [provider]    Allergies    Patient has no known allergies.  Review of Systems   Review of Systems  All other systems reviewed and are negative.   Physical Exam Updated Vital Signs BP 114/77 (BP Location: Left Arm)   Pulse 85   Temp 98.3 F (36.8 C) (Oral)   Resp 18   SpO2 99%   Physical Exam Nursing note reviewed.  Constitutional:      Appearance:  Normal appearance.  HENT:     Head: Normocephalic.     Right Ear: External ear normal.     Left Ear: External ear normal.     Nose: Nose normal.     Mouth/Throat:     Mouth: Mucous membranes are moist.  Cardiovascular:     Rate and Rhythm: Normal rate and regular rhythm.     Pulses: Normal pulses.     Heart sounds: Normal heart sounds.  Pulmonary:     Effort: Pulmonary effort is normal.  Abdominal:  General: Abdomen is flat.     Palpations: Abdomen is soft.  Musculoskeletal:        General: Normal range of motion.     Cervical back: Normal range of motion.     Comments: Patient with no obvious signs of trauma or deformity on cervical, thoracic, or lumbar spine.  There is some mild para spinal tenderness palpation diffusely in the neck. There is no tenderness palpation of the thoracic or lumbar spine. Left hand with contusion on palmar ulnar side.  She has some mild tenderness dorsally over the fifth metacarpal There is no obvious deformity noted.  She is flexion range of motion of the fingers and wrist  Skin:    General: Skin is warm and dry.  Neurological:     General: No focal deficit present.     Mental Status: She is alert and oriented to person, place, and time.     Cranial Nerves: No cranial nerve deficit.  Psychiatric:        Mood and Affect: Mood normal.        Behavior: Behavior normal.     ED Results / Procedures / Treatments   Labs (all labs ordered are listed, but only abnormal results are displayed) Labs Reviewed - No data to display  EKG None  Radiology CT Head Wo Contrast  Result Date: 01/23/2021 CLINICAL DATA:  Trauma. Fell during snowboarding. Headache and neck pain. EXAM: CT HEAD WITHOUT CONTRAST CT CERVICAL SPINE WITHOUT CONTRAST TECHNIQUE: Multidetector CT imaging of the head and cervical spine was performed following the standard protocol without intravenous contrast. Multiplanar CT image reconstructions of the cervical spine were also generated.  COMPARISON:  None. FINDINGS: CT HEAD FINDINGS Brain: No evidence of acute infarction, hemorrhage, hydrocephalus, extra-axial collection or mass lesion/mass effect. Vascular: No hyperdense vessel or unexpected calcification. Skull: No acute fracture. Sinuses/Orbits: Visualized sinuses are largely clear. Unremarkable orbits. Other: No mastoid effusions. CT CERVICAL SPINE FINDINGS Alignment: No substantial sagittal subluxation. Skull base and vertebrae: Vertebral body heights are maintained. No evidence of acute fracture. Soft tissues and spinal canal: No prevertebral fluid or swelling. No visible canal hematoma. Disc levels:  No significant focal bony degenerative change. Upper chest: Lung apices are clear. IMPRESSION: 1. No evidence of acute intracranial abnormality. 2. No evidence of acute fracture or traumatic malalignment in the cervical spine. Electronically Signed   By: Margaretha Sheffield MD   On: 01/23/2021 16:52   CT Cervical Spine Wo Contrast  Result Date: 01/23/2021 CLINICAL DATA:  Trauma. Fell during snowboarding. Headache and neck pain. EXAM: CT HEAD WITHOUT CONTRAST CT CERVICAL SPINE WITHOUT CONTRAST TECHNIQUE: Multidetector CT imaging of the head and cervical spine was performed following the standard protocol without intravenous contrast. Multiplanar CT image reconstructions of the cervical spine were also generated. COMPARISON:  None. FINDINGS: CT HEAD FINDINGS Brain: No evidence of acute infarction, hemorrhage, hydrocephalus, extra-axial collection or mass lesion/mass effect. Vascular: No hyperdense vessel or unexpected calcification. Skull: No acute fracture. Sinuses/Orbits: Visualized sinuses are largely clear. Unremarkable orbits. Other: No mastoid effusions. CT CERVICAL SPINE FINDINGS Alignment: No substantial sagittal subluxation. Skull base and vertebrae: Vertebral body heights are maintained. No evidence of acute fracture. Soft tissues and spinal canal: No prevertebral fluid or swelling. No  visible canal hematoma. Disc levels:  No significant focal bony degenerative change. Upper chest: Lung apices are clear. IMPRESSION: 1. No evidence of acute intracranial abnormality. 2. No evidence of acute fracture or traumatic malalignment in the cervical spine. Electronically Signed  By: Margaretha Sheffield MD   On: 01/23/2021 16:52   DG Hand Complete Left  Result Date: 01/23/2021 CLINICAL DATA:  [place] snowboarding yesterday complaining of fifth metacarpal pain. Bruising and swelling. EXAM: LEFT HAND - COMPLETE 3+ VIEW COMPARISON:  None. FINDINGS: There is no evidence of fracture or dislocation. There is no evidence of arthropathy or other focal bone abnormality. Soft tissues are unremarkable. IMPRESSION: No acute osseous abnormality. Electronically Signed   By: Dahlia Bailiff MD   On: 01/23/2021 17:23    Procedures Procedures   Medications Ordered in ED Medications - No data to display  ED Course  I have reviewed the triage vital signs and the nursing notes.  Pertinent labs & imaging results that were available during my care of the patient were reviewed by me and considered in my medical decision making (see chart for details).    MDM Rules/Calculators/A&P                          35 year old female presents today after snowboard injury yesterday.  She is having headache, neck pain, and contusion to her hand.  CT obtained of her head and neck without any evidence of acute intracranial abnormality or cervical spine injury.  Plain films of her left hand reveal no evidence of fracture.  Discussed above with patient and she is appear stable for discharge. Final Clinical Impression(s) / ED Diagnoses Final diagnoses:  Injury of head, initial encounter  Concussion without loss of consciousness, initial encounter  Neck pain  Contusion of left hand, initial encounter    Rx / DC Orders ED Discharge Orders    None       Pattricia Boss, MD 01/23/21 1759

## 2021-01-24 ENCOUNTER — Ambulatory Visit (INDEPENDENT_AMBULATORY_CARE_PROVIDER_SITE_OTHER): Payer: Medicaid Other | Admitting: Orthopaedic Surgery

## 2021-01-24 ENCOUNTER — Encounter: Payer: Self-pay | Admitting: Orthopaedic Surgery

## 2021-01-24 DIAGNOSIS — M25511 Pain in right shoulder: Secondary | ICD-10-CM | POA: Diagnosis not present

## 2021-01-24 DIAGNOSIS — M542 Cervicalgia: Secondary | ICD-10-CM

## 2021-01-24 DIAGNOSIS — M25512 Pain in left shoulder: Secondary | ICD-10-CM | POA: Diagnosis not present

## 2021-01-24 NOTE — Progress Notes (Signed)
Office Visit Note   Patient: Evelyn [name]           Date of Birth: 24-May-1986           MRN: [id number] Visit Date: 01/24/2021              Requested by: Nickola Major, MD [address] [place],   [id number] PCP: Nickola Major, MD   Assessment & Plan: Visit Diagnoses:  1. Neck pain   2. Acute pain of both shoulders     Plan: Impression is whiplash and left hand contusion.  I recommend continue symptomatic treatment.  Overall it is getting better.  We will see her back as needed.  Follow-Up Instructions: Return if symptoms worsen or fail to improve.   Orders:  No orders of the defined types were placed in this encounter.  No orders of the defined types were placed in this encounter.     Procedures: No procedures performed   Clinical Data: No additional findings.   Subjective: Chief Complaint  Patient presents with  . Neck - Pain  . Left Hand - Pain    Evelyn [name] comes in today for evaluation of neck and bilateral shoulder pain status post fall during snowboarding in which she sustained a whiplash.  She was evaluated in the ER recently and CT scan and x-rays were negative.  Denies any focal deficits or numbness and tingling.  She is also complaining of some swelling and tenderness over the fifth metacarpal of the left hand.   Review of Systems  Constitutional: Negative.   HENT: Negative.   Eyes: Negative.   Respiratory: Negative.   Cardiovascular: Negative.   Endocrine: Negative.   Musculoskeletal: Negative.   Neurological: Negative.   Hematological: Negative.   Psychiatric/Behavioral: Negative.   All other systems reviewed and are negative.    Objective: Vital Signs: There were no vitals taken for this visit.  Physical Exam Vitals and nursing note reviewed.  Constitutional:      Appearance: She is well-developed and well-nourished.  HENT:     Head: Normocephalic and atraumatic.  Eyes:     Extraocular Movements: EOM normal.   Pulmonary:     Effort: Pulmonary effort is normal.  Abdominal:     Palpations: Abdomen is soft.  Musculoskeletal:     Cervical back: Neck supple.  Skin:    General: Skin is warm.     Capillary Refill: Capillary refill takes less than 2 seconds.  Neurological:     Mental Status: She is alert and oriented to person, place, and time.  Psychiatric:        Mood and Affect: Mood and affect normal.        Behavior: Behavior normal.        Thought Content: Thought content normal.        Judgment: Judgment normal.     Ortho Exam Left hand shows mild swelling over the fifth metacarpal.  Slight tenderness to the soft tissues.  No neurovascular compromise.  Cervical spine and shoulder exams are relatively benign other than some generalized discomfort with palpation of the trapezius.  No focal findings. Specialty Comments:  No specialty comments available.  Imaging: CT Head Wo Contrast  Result Date: 01/23/2021 CLINICAL DATA:  Trauma. Fell during snowboarding. Headache and neck pain. EXAM: CT HEAD WITHOUT CONTRAST CT CERVICAL SPINE WITHOUT CONTRAST TECHNIQUE: Multidetector CT imaging of the head and cervical spine was performed following the standard protocol without intravenous contrast. Multiplanar CT  image reconstructions of the cervical spine were also generated. COMPARISON:  None. FINDINGS: CT HEAD FINDINGS Brain: No evidence of acute infarction, hemorrhage, hydrocephalus, extra-axial collection or mass lesion/mass effect. Vascular: No hyperdense vessel or unexpected calcification. Skull: No acute fracture. Sinuses/Orbits: Visualized sinuses are largely clear. Unremarkable orbits. Other: No mastoid effusions. CT CERVICAL SPINE FINDINGS Alignment: No substantial sagittal subluxation. Skull base and vertebrae: Vertebral body heights are maintained. No evidence of acute fracture. Soft tissues and spinal canal: No prevertebral fluid or swelling. No visible canal hematoma. Disc levels:  No  significant focal bony degenerative change. Upper chest: Lung apices are clear. IMPRESSION: 1. No evidence of acute intracranial abnormality. 2. No evidence of acute fracture or traumatic malalignment in the cervical spine. Electronically Signed   By: Frederick S Jones MD   On: 01/23/2021 16:52   CT Cervical Spine Wo Contrast  Result Date: 01/23/2021 CLINICAL DATA:  Trauma. Fell during snowboarding. Headache and neck pain. EXAM: CT HEAD WITHOUT CONTRAST CT CERVICAL SPINE WITHOUT CONTRAST TECHNIQUE: Multidetector CT imaging of the head and cervical spine was performed following the standard protocol without intravenous contrast. Multiplanar CT image reconstructions of the cervical spine were also generated. COMPARISON:  None. FINDINGS: CT HEAD FINDINGS Brain: No evidence of acute infarction, hemorrhage, hydrocephalus, extra-axial collection or mass lesion/mass effect. Vascular: No hyperdense vessel or unexpected calcification. Skull: No acute fracture. Sinuses/Orbits: Visualized sinuses are largely clear. Unremarkable orbits. Other: No mastoid effusions. CT CERVICAL SPINE FINDINGS Alignment: No substantial sagittal subluxation. Skull base and vertebrae: Vertebral body heights are maintained. No evidence of acute fracture. Soft tissues and spinal canal: No prevertebral fluid or swelling. No visible canal hematoma. Disc levels:  No significant focal bony degenerative change. Upper chest: Lung apices are clear. IMPRESSION: 1. No evidence of acute intracranial abnormality. 2. No evidence of acute fracture or traumatic malalignment in the cervical spine. Electronically Signed   By: Frederick S Jones MD   On: 01/23/2021 16:52   DG Hand Complete Left  Result Date: 01/23/2021 CLINICAL DATA:  Fell snowboarding yesterday complaining of fifth metacarpal pain. Bruising and swelling. EXAM: LEFT HAND - COMPLETE 3+ VIEW COMPARISON:  None. FINDINGS: There is no evidence of fracture or dislocation. There is no evidence of  arthropathy or other focal bone abnormality. Soft tissues are unremarkable. IMPRESSION: No acute osseous abnormality. Electronically Signed   By: Jeffrey  Waltz MD   On: 01/23/2021 17:23     PMFS History: Patient Active Problem List   Diagnosis Date Noted  . Pain of left side of body 07/02/2019  . Paresthesia 07/02/2019  . Vulvovaginal candidiasis 06/23/2019  . Gastroesophageal reflux disease 01/27/2019  . Systemic lupus erythematosus ([facility]) 11/19/2018  . Raynaud's disease without gangrene 11/19/2018  . High risk medication use 11/19/2018  . History of bilateral carpal tunnel release 11/19/2018  . Autoimmune disease ([facility]) 10/08/2018  . Carpal tunnel syndrome, right upper limb 09/26/2018  . Radicular pain in right arm 09/16/2018  . History of pregnancy induced hypertension 08/03/2014  . History of pre-eclampsia 08/03/2014  . Chronic back pain 01/14/2014  . Allergic rhinitis 01/14/2014   Past Medical History:  Diagnosis Date  . Depression    post partum  . GERD (gastroesophageal reflux disease)   . Systemic lupus erythematosus ([facility])   . Umbilical hernia     Family History  Problem Relation Age of Onset  . Healthy Mother   . Healthy Sister   . Healthy Brother   . Asthma Son   . Healthy Son   .   Healthy Son   . Healthy Son   . Other Father        never met her father - unsure of his history  . Healthy Daughter   . Breast cancer Cousin 26       undergoing chemo/radiation  . Heart disease Neg Hx   . Neurologic Disorder Neg Hx   . Colon cancer Neg Hx   . Esophageal cancer Neg Hx     Past Surgical History:  Procedure Laterality Date  . BREAST LUMPECTOMY WITH RADIOACTIVE SEED LOCALIZATION Right 03/09/2020   Procedure: RIGHT BREAST LUMPECTOMY WITH RADIOACTIVE SEED LOCALIZATION;  Surgeon: Cornett, Thomas, MD;  Location: [facility];  Service: General;  Laterality: Right;  . CARPAL TUNNEL RELEASE Right 09/26/2018   Procedure: RIGHT CARPAL TUNNEL RELEASE;  Surgeon:  Xu, Naiping M, MD;  Location: [facility];  Service: Orthopedics;  Laterality: Right;  . CARPAL TUNNEL RELEASE Left 2017  . CESAREAN SECTION N/A 08/05/2014   Procedure: CESAREAN SECTION;  Surgeon: Dyanna Clark, MD;  Location: [facility];  Service: Obstetrics;  Laterality: N/A;  . KNEE SURGERY Left 2000  . LAPAROSCOPIC BILATERAL SALPINGECTOMY N/A 12/13/2015   Procedure: LAPAROSCOPIC BILATERAL SALPINGECTOMY;  Surgeon: Myra C Dove, MD;  Location: [facility];  Service: Gynecology;  Laterality: N/A;  . PERINEOPLASTY N/A 02/20/2016   Procedure: PERINEOPLASTY;  Surgeon: Myra C Dove, MD;  Location: [facility];  Service: Gynecology;  Laterality: N/A;  . PUBOVAGINAL SLING N/A 12/13/2015   Procedure: PUBO-VAGINAL SLING;  Surgeon: Myra C Dove, MD;  Location: [facility];  Service: Gynecology;  Laterality: N/A;  . RECTOCELE REPAIR N/A 02/20/2016   Procedure: POSTERIOR REPAIR (RECTOCELE);  Surgeon: Myra C Dove, MD;  Location: [facility];  Service: Gynecology;  Laterality: N/A;  . UMBILICAL HERNIA REPAIR  2011   Social History   Occupational History  . Occupation: [employer] - part-time  Tobacco Use  . Smoking status: Never Smoker  . Smokeless tobacco: Never Used  Vaping Use  . Vaping Use: Never used  Substance and Sexual Activity  . Alcohol use: Not Currently  . Drug use: No  . Sexual activity: Yes    Partners: Male    Birth control/protection: Surgical, I.U.D.       

## 2021-02-20 NOTE — Progress Notes (Signed)
[facility] Note  02/22/2021     CHIEF COMPLAINT Patient presents for Retina Follow Up   HISTORY OF PRESENT ILLNESS: Evelyn [name] is a 35 y.o. female who presents to the clinic today for:   HPI    Retina Follow Up    Patient presents with  Other (plaquenil).  In both eyes.  Severity is moderate.  Duration of 12 months.  I, the attending physician,  performed the HPI with the patient and updated documentation appropriately.          Comments    Patient states unsure if any vision changes. On plaquenil $RemoveBefo'200mg'mHdEloqAiTl$  bid po, 5 days a week. On plaquenil for lupus for 3 years.        Last edited by Bernarda Caffey, MD on 02/22/2021 12:59 PM. (History)    Patient states she had an episode about a month ago where she had a "black screen" in her right eye vision, she states it came out of nowhere and she wasn't doing anything special at the time, she states since then she feels like her right eye is "lazier" than the left  Referring physician: Nickola Major, MD [address] [place],  [place] [id number]  HISTORICAL INFORMATION:   Selected notes from the [facility] Referred by Dr. Zenia Resides for eval of lattice holes OU.   CURRENT MEDICATIONS: No current outpatient medications on file. (Ophthalmic Drugs)   No current facility-administered medications for this visit. (Ophthalmic Drugs)   Current Outpatient Medications (Other)  Medication Sig  . Cholecalciferol (VITAMIN D) 50 MCG (2000 UT) CAPS Take by mouth daily.  Marland Kitchen HYDROcodone-acetaminophen (NORCO) 5-325 MG tablet Take 1-2 tablets by mouth daily as needed for moderate pain.  . hydrocortisone 2.5 % cream APP EXT AA BID FOR FACIAL RASH  . hydroxychloroquine (PLAQUENIL) 200 MG tablet Take 1 tablet by mouth twice daily Monday through Friday only.  Marland Kitchen ibuprofen (ADVIL) 800 MG tablet Take 1 tablet (800 mg total) by mouth every 8 (eight) hours as needed.  Marland Kitchen LEVONORGESTREL IU 1 each by  Intrauterine route once.   . triamcinolone (KENALOG) 0.1 % paste Place onto teeth.  . valACYclovir (VALTREX) 500 MG tablet TAKE 1 TABLET(500 MG) BY MOUTH TWICE DAILY (Patient taking differently: as needed.)  . venlafaxine XR (EFFEXOR-XR) 150 MG 24 hr capsule TAKE 1 CAPSULE(150 MG) BY MOUTH DAILY  . pantoprazole (PROTONIX) 40 MG tablet Take 1 tablet (40 mg total) by mouth daily before breakfast.  . pregabalin (LYRICA) 50 MG capsule Take 100 mg by mouth daily.    No current facility-administered medications for this visit. (Other)      REVIEW OF SYSTEMS: ROS    Positive for: Gastrointestinal, Musculoskeletal, Eyes   Negative for: Constitutional, Neurological, Skin, Genitourinary, HENT, Endocrine, Cardiovascular, Respiratory, Psychiatric, Allergic/Imm, Heme/Lymph   Last edited by Jobe Marker, COT on 02/22/2021  9:17 AM. (History)       ALLERGIES No Known Allergies  PAST MEDICAL HISTORY Past Medical History:  Diagnosis Date  . Depression    post partum  . GERD (gastroesophageal reflux disease)   . Systemic lupus erythematosus ([place])   . Umbilical hernia    Past Surgical History:  Procedure Laterality Date  . BREAST LUMPECTOMY WITH RADIOACTIVE SEED LOCALIZATION Right 03/09/2020   Procedure: RIGHT BREAST LUMPECTOMY WITH RADIOACTIVE SEED LOCALIZATION;  Surgeon: Erroll Luna, MD;  Location: [facility];  Service: General;  Laterality: Right;  . CARPAL TUNNEL  RELEASE Right 09/26/2018   Procedure: RIGHT CARPAL TUNNEL RELEASE;  Surgeon: Leandrew Koyanagi, MD;  Location: [facility];  Service: Orthopedics;  Laterality: Right;  . CARPAL TUNNEL RELEASE Left 2017  . CESAREAN SECTION N/A 08/05/2014   Procedure: CESAREAN SECTION;  Surgeon: Jerelyn Charles, MD;  Location: [facility];  Service: Obstetrics;  Laterality: N/A;  . KNEE SURGERY Left 2000  . LAPAROSCOPIC BILATERAL SALPINGECTOMY N/A 12/13/2015   Procedure: LAPAROSCOPIC BILATERAL SALPINGECTOMY;  Surgeon: Emily Filbert, MD;  Location: [facility];  Service: Gynecology;  Laterality: N/A;  . PERINEOPLASTY N/A 02/20/2016   Procedure: PERINEOPLASTY;  Surgeon: Emily Filbert, MD;  Location: [facility];  Service: Gynecology;  Laterality: N/A;  . PUBOVAGINAL SLING N/A 12/13/2015   Procedure: Gaynelle Arabian;  Surgeon: Emily Filbert, MD;  Location: [facility];  Service: Gynecology;  Laterality: N/A;  . RECTOCELE REPAIR N/A 02/20/2016   Procedure: POSTERIOR REPAIR (RECTOCELE);  Surgeon: Emily Filbert, MD;  Location: [facility];  Service: Gynecology;  Laterality: N/A;  . UMBILICAL HERNIA REPAIR  2011    FAMILY HISTORY Family History  Problem Relation Age of Onset  . Healthy Mother   . Healthy Sister   . Healthy Brother   . Asthma Son   . Healthy Son   . Healthy Son   . Healthy Son   . Other Father        never met her father - unsure of his history  . Healthy Daughter   . Breast cancer Cousin 26       undergoing chemo/radiation  . Heart disease Neg Hx   . Neurologic Disorder Neg Hx   . Colon cancer Neg Hx   . Esophageal cancer Neg Hx     SOCIAL HISTORY Social History   Tobacco Use  . Smoking status: Never Smoker  . Smokeless tobacco: Never Used  Vaping Use  . Vaping Use: Never used  Substance Use Topics  . Alcohol use: Not Currently  . Drug use: No         OPHTHALMIC EXAM:  Base Eye Exam    Visual Acuity (Snellen - Linear)      Right Left   Dist cc 20/20 20/20 -1   Correction: Glasses       Tonometry (Tonopen, 9:25 AM)      Right Left   Pressure 18 17       Pupils      Dark Light Shape React APD   Right 4 3 Round Brisk None   Left 4 3 Round Brisk None       Visual Fields (Counting fingers)      Left Right    Full Full       Extraocular Movement      Right Left    Full, Ortho Full, Ortho       Neuro/Psych    Oriented x3: Yes   Mood/Affect: Normal       Dilation    Both eyes: 2.5% Phenylephrine, 1.0% Mydriacyl @ 9:25 AM        Additional Tests    Color      Right Left    [place] 25/25 25/25        Slit Lamp and Fundus Exam    Slit Lamp Exam      Right Left   Lids/Lashes Normal Normal   Conjunctiva/Sclera White and quiet White and quiet   Cornea Clear Clear   Anterior Chamber Deep and quiet Deep and  quiet   Iris Round and dilated Round and dilated   Lens Clear Clear   Vitreous Trace Vitreous syneresis, no cell Trace Vitreous syneresis, no cell       Fundus Exam      Right Left   Disc Pink and Sharp Pink and Sharp   C/D Ratio 0.3 0.3   Macula Flat, Good foveal reflex, No heme or edema Flat, Good foveal reflex, No heme or edema   Vessels Normal Normal   Periphery Attached, temporal WWP, flap tear at 0930 ora, small patch of lattice with atrophic holes at 1000--good laser changes surrounding, mild patch of lattice at 0600 very anterior micro tear at 0700 ora--light laser changes surrounding, patch of lattice at 1030-11 -- good laser in place around all lesions, No new RT/RD Attached, mild patch of pigmented lattice at 0430, peripheral patch of lattice extending from 0500-0730, focal pigmented patch at 0730 equator -- excellent laser changes in place, No new RT/RD        Refraction    Wearing Rx      Sphere Cylinder Axis   Right -4.00 +1.00 103   Left -3.75 +1.00 073   Type: SVL          IMAGING AND PROCEDURES  OCT, Retina - OU - Both Eyes       Right Eye Quality was good. Central Foveal Thickness: 289. Progression has been stable. Findings include normal foveal contour, no IRF, no SRF, vitreomacular adhesion .   Left Eye Quality was good. Central Foveal Thickness: 282. Progression has been stable. Findings include normal foveal contour, no IRF, no SRF, vitreomacular adhesion .   Notes *Images captured and stored on drive  Diagnosis / Impression:  NFP, no IRF/SRF OU--stable VMA OU No plaquenil toxicity OU  Clinical management:  See below  Abbreviations: NFP - Normal foveal profile. CME - cystoid macular edema. PED - pigment  epithelial detachment. IRF - intraretinal fluid. SRF - subretinal fluid. EZ - ellipsoid zone. ERM - epiretinal membrane. ORA - outer retinal atrophy. ORT - outer retinal tubulation. SRHM - subretinal hyper-reflective material               ASSESSMENT/PLAN:    ICD-10-CM   1. Bilateral retinal lattice degeneration  H35.413   2. Bilateral retinal defect  H33.303   3. Retinal edema  H35.81 OCT, Retina - OU - Both Eyes  4. Myopia of both eyes with astigmatism  H52.13    H52.203   5. Long-term use of Plaquenil  Z79.899   6. Other systemic lupus erythematosus with other organ involvement (HCC)  M32.19    1,2. Lattice degeneration w/ retinal defects, both eyes  - right eye: flap tear at 0930 ora, small patch of pigmented lattice with atrophic holes at 1000, mild patch at 0600, very anterior micro tear at 0700 ora  - left eye: mild patch of pigmented lattice at 0430, peripheral patch of lattice extending from 0500-0730, focal pigmented patch at 0730 equator  - s/p laser retinopexy OD (12.22.20), laser touch up OD 02.23.21 -- excellent laser in place             - s/p laser retinopexy OS (01.12.20) -- excellent laser changes in place  - no new RT/RD or lattice OU  - f/u 1 year -- DFE/OCT  3. No retinal edema on exam or OCT  4. Myopia with astigmatism OU  5,6. SLE on Plaquenil  - currently taking 200 mg BID (400 mg daily), M-F  only  - no retinal toxicity noted on exam or OCT today  - pt's wt is 64 kg  - w/ M-F dosing only, (400/64 x 5d) / 7d = an average of 4.46 mg/kg/day  - the AAO recommends daily dosing of < 5.0 mg/kg for HCQ   Ophthalmic Meds Ordered this visit:  No orders of the defined types were placed in this encounter.      Return in about 1 year (around 02/22/2022) for f/u lattice degeneration / Plaquenil exam, DFE, OCT.  There are no Patient Instructions on file for this visit.   Explained the diagnoses, plan, and follow up with the patient and they expressed  understanding.  Patient expressed understanding of the importance of proper follow up care.   This document serves as a record of services personally performed by Gardiner Sleeper, MD, PhD. It was created on their behalf by Roselee Nova, COMT. The creation of this record is the provider's dictation and/or activities during the visit.  Electronically signed by: Roselee Nova, COMT 02/22/21 1:00 PM  This document serves as a record of services personally performed by Gardiner Sleeper, MD, PhD. It was created on their behalf by San Jetty. Owens Shark, OA an ophthalmic technician. The creation of this record is the provider's dictation and/or activities during the visit.    Electronically signed by: San Jetty. Owens Shark, [place] 03.23.2022 1:00 PM   Gardiner Sleeper, M.D., Ph.D. Diseases & Surgery of the Retina and Vitreous [facility]   I have reviewed the above documentation for accuracy and completeness, and I agree with the above. Gardiner Sleeper, M.D., Ph.D. 02/22/21 1:00 PM   Abbreviations: M myopia (nearsighted); A astigmatism; H hyperopia (farsighted); P presbyopia; Mrx spectacle prescription;  CTL contact lenses; OD right eye; OS left eye; OU both eyes  XT exotropia; ET esotropia; PEK punctate epithelial keratitis; PEE punctate epithelial erosions; DES dry eye syndrome; MGD meibomian gland dysfunction; ATs artificial tears; PFAT's preservative free artificial tears; [place] nuclear sclerotic cataract; PSC posterior subcapsular cataract; ERM epi-retinal membrane; PVD posterior vitreous detachment; RD retinal detachment; DM diabetes mellitus; DR diabetic retinopathy; NPDR non-proliferative diabetic retinopathy; PDR proliferative diabetic retinopathy; CSME clinically significant macular edema; DME diabetic macular edema; dbh dot blot hemorrhages; CWS cotton wool spot; POAG primary open angle glaucoma; C/D cup-to-disc ratio; HVF humphrey visual field; GVF goldmann visual field; OCT optical  coherence tomography; IOP intraocular pressure; BRVO Branch retinal vein occlusion; CRVO central retinal vein occlusion; CRAO central retinal artery occlusion; BRAO branch retinal artery occlusion; RT retinal tear; SB scleral buckle; PPV pars plana vitrectomy; VH Vitreous hemorrhage; PRP panretinal laser photocoagulation; IVK intravitreal kenalog; VMT vitreomacular traction; MH Macular hole;  NVD neovascularization of the disc; NVE neovascularization elsewhere; AREDS age related eye disease study; ARMD age related macular degeneration; POAG primary open angle glaucoma; EBMD epithelial/anterior basement membrane dystrophy; ACIOL anterior chamber intraocular lens; IOL intraocular lens; PCIOL posterior chamber intraocular lens; Phaco/IOL phacoemulsification with intraocular lens placement; [place] photorefractive keratectomy; LASIK laser assisted in situ keratomileusis; HTN hypertension; DM diabetes mellitus; COPD chronic obstructive pulmonary disease

## 2021-02-22 ENCOUNTER — Encounter (INDEPENDENT_AMBULATORY_CARE_PROVIDER_SITE_OTHER): Payer: Self-pay | Admitting: Ophthalmology

## 2021-02-22 ENCOUNTER — Ambulatory Visit (INDEPENDENT_AMBULATORY_CARE_PROVIDER_SITE_OTHER): Payer: 59 | Admitting: Ophthalmology

## 2021-02-22 ENCOUNTER — Other Ambulatory Visit: Payer: Self-pay

## 2021-02-22 DIAGNOSIS — H5213 Myopia, bilateral: Secondary | ICD-10-CM

## 2021-02-22 DIAGNOSIS — Z79899 Other long term (current) drug therapy: Secondary | ICD-10-CM

## 2021-02-22 DIAGNOSIS — H3581 Retinal edema: Secondary | ICD-10-CM | POA: Diagnosis not present

## 2021-02-22 DIAGNOSIS — H33303 Unspecified retinal break, bilateral: Secondary | ICD-10-CM | POA: Diagnosis not present

## 2021-02-22 DIAGNOSIS — H35413 Lattice degeneration of retina, bilateral: Secondary | ICD-10-CM

## 2021-02-22 DIAGNOSIS — M3219 Other organ or system involvement in systemic lupus erythematosus: Secondary | ICD-10-CM

## 2021-02-22 DIAGNOSIS — H52203 Unspecified astigmatism, bilateral: Secondary | ICD-10-CM

## 2021-03-27 ENCOUNTER — Encounter (HOSPITAL_COMMUNITY): Payer: Self-pay

## 2021-03-28 ENCOUNTER — Ambulatory Visit: Payer: Self-pay

## 2021-03-28 ENCOUNTER — Encounter: Payer: Self-pay | Admitting: Orthopaedic Surgery

## 2021-03-28 ENCOUNTER — Ambulatory Visit (INDEPENDENT_AMBULATORY_CARE_PROVIDER_SITE_OTHER): Payer: 59 | Admitting: Orthopaedic Surgery

## 2021-03-28 DIAGNOSIS — G8929 Other chronic pain: Secondary | ICD-10-CM | POA: Diagnosis not present

## 2021-03-28 DIAGNOSIS — M545 Low back pain, unspecified: Secondary | ICD-10-CM

## 2021-03-28 DIAGNOSIS — M25512 Pain in left shoulder: Secondary | ICD-10-CM | POA: Diagnosis not present

## 2021-03-28 MED ORDER — PREDNISONE 10 MG (21) PO TBPK: ORAL_TABLET | ORAL | 0 refills | Status: DC

## 2021-03-28 NOTE — Progress Notes (Signed)
Office Visit Note   Patient: Evelyn [name]           Date of Birth: [birth year]           MRN: [id number] Visit Date: 03/28/2021              Requested by: Nickola Major, MD [address] [place],  [place] [address] PCP: Nickola Major, MD   Assessment & Plan: Visit Diagnoses:  1. Chronic left shoulder pain   2. Low back pain, unspecified back pain laterality, unspecified chronicity, unspecified whether sciatica present     Plan: Impression is left shoulder questionable labral pathology and chronic right lower back pain.  In regards to the left shoulder, we will refer the patient to Dr. Junius Roads for diagnostic medically therapeutic cortisone injection to the glenohumeral joint.  In regards to her lower back, I called in a steroid taper and put in a referral for physical therapy.  She will follow-up with [place] as needed for both problems.  Call with concerns or questions in the meantime.  Follow-Up Instructions: Return if symptoms worsen or fail to improve.   Orders:  Orders Placed This Encounter  Procedures  . XR Shoulder Left  . XR Lumbar Spine 2-3 Views   No orders of the defined types were placed in this encounter.     Procedures: No procedures performed   Clinical Data: No additional findings.   Subjective: Chief Complaint  Patient presents with  . Left Shoulder - Pain  . Lower Back - Pain    HPI patient is a pleasant 35 year old female who comes in today with complaints of left shoulder and right lower back pain.  In regards to her left shoulder, she has been dealing with this for the past 2 to 3 months.  No specific injury.  She does note that this started prior to a snowboard injury which occurred in February.  The pain she has is to the anterior shoulder worse with external range of motion.  She denies any weakness.  She notes occasional tingling to the left hand.  No neck pain.  She is status post left carpal tunnel syndrome back in 2016.  She is  also status post right shoulder rotator cuff injury where she was provided with exercises.  She has been doing this for the left shoulder without relief.  In regards to her back, she has had pain here for a while.  The pain is located to the right lower back without any radiation down the back of the leg.  No anterior thigh or groin pain.  No paresthesias to the right lower extremity.  Review of Systems as detailed in HPI.  All others reviewed and are negative.   Objective: Vital Signs: There were no vitals taken for this visit.  Physical Exam well-developed well-nourished female in no acute distress.  Alert and oriented x3.  Ortho Exam left shoulder exam shows near full active range of motion all planes.  Negative empty can and cross body adduction.  She does have a positive O'Brien's test.  Negative speeds test.  No focal weakness.  She is neurovascular intact distally.  Specialty Comments:  No specialty comments available.  Imaging: XR Lumbar Spine 2-3 Views  Result Date: 03/28/2021 Mild to moderate facet degenerative changes L5-S1  XR Shoulder Left  Result Date: 03/28/2021 No acute or structural abnormalities    PMFS History: Patient Active Problem List   Diagnosis Date Noted  . Pain of  left side of body 07/02/2019  . Paresthesia 07/02/2019  . Vulvovaginal candidiasis 06/23/2019  . Gastroesophageal reflux disease 01/27/2019  . Systemic lupus erythematosus ([place]) 11/19/2018  . Raynaud's disease without gangrene 11/19/2018  . High risk medication use 11/19/2018  . History of bilateral carpal tunnel release 11/19/2018  . Autoimmune disease ([place]) 10/08/2018  . Carpal tunnel syndrome, right upper limb 09/26/2018  . Radicular pain in right arm 09/16/2018  . History of pregnancy induced hypertension 08/03/2014  . History of pre-eclampsia 08/03/2014  . Chronic back pain 01/14/2014  . Allergic rhinitis 01/14/2014   Past Medical History:  Diagnosis Date  . Depression    post  partum  . GERD (gastroesophageal reflux disease)   . Systemic lupus erythematosus ([place])   . Umbilical hernia     Family History  Problem Relation Age of Onset  . Healthy Mother   . Healthy Sister   . Healthy Brother   . Asthma Son   . Healthy Son   . Healthy Son   . Healthy Son   . Other Father        never met her father - unsure of his history  . Healthy Daughter   . Breast cancer Cousin 26       undergoing chemo/radiation  . Heart disease Neg Hx   . Neurologic Disorder Neg Hx   . Colon cancer Neg Hx   . Esophageal cancer Neg Hx     Past Surgical History:  Procedure Laterality Date  . BREAST LUMPECTOMY WITH RADIOACTIVE SEED LOCALIZATION Right 03/09/2020   Procedure: RIGHT BREAST LUMPECTOMY WITH RADIOACTIVE SEED LOCALIZATION;  Surgeon: Erroll Luna, MD;  Location: [facility];  Service: General;  Laterality: Right;  . CARPAL TUNNEL RELEASE Right 09/26/2018   Procedure: RIGHT CARPAL TUNNEL RELEASE;  Surgeon: Leandrew Koyanagi, MD;  Location: [facility];  Service: Orthopedics;  Laterality: Right;  . CARPAL TUNNEL RELEASE Left 2017  . CESAREAN SECTION N/A 08/05/2014   Procedure: CESAREAN SECTION;  Surgeon: Jerelyn Charles, MD;  Location: [facility];  Service: Obstetrics;  Laterality: N/A;  . KNEE SURGERY Left 2000  . LAPAROSCOPIC BILATERAL SALPINGECTOMY N/A 12/13/2015   Procedure: LAPAROSCOPIC BILATERAL SALPINGECTOMY;  Surgeon: Emily Filbert, MD;  Location: [facility];  Service: Gynecology;  Laterality: N/A;  . PERINEOPLASTY N/A 02/20/2016   Procedure: PERINEOPLASTY;  Surgeon: Emily Filbert, MD;  Location: [facility];  Service: Gynecology;  Laterality: N/A;  . PUBOVAGINAL SLING N/A 12/13/2015   Procedure: Gaynelle Arabian;  Surgeon: Emily Filbert, MD;  Location: [facility];  Service: Gynecology;  Laterality: N/A;  . RECTOCELE REPAIR N/A 02/20/2016   Procedure: POSTERIOR REPAIR (RECTOCELE);  Surgeon: Emily Filbert, MD;  Location: [facility];  Service: Gynecology;  Laterality: N/A;  .  UMBILICAL HERNIA REPAIR  2011   Social History   Occupational History  . Occupation: Magazine features editor - part-time  Tobacco Use  . Smoking status: Never Smoker  . Smokeless tobacco: Never Used  Vaping Use  . Vaping Use: Never used  Substance and Sexual Activity  . Alcohol use: Not Currently  . Drug use: No  . Sexual activity: Yes    Partners: Male    Birth control/protection: Surgical, I.U.D.

## 2021-03-28 NOTE — Progress Notes (Signed)
Subjective: Patient is here for ultrasound-guided intra-articular left glenohumeral injection.  Pain, question labrum tear.  Objective:  Full ROM but pain with lateral raise.  Procedure: Ultrasound guided injection is preferred based studies that show increased duration, increased effect, greater accuracy, decreased procedural pain, increased response rate, and decreased cost with ultrasound guided versus blind injection.   Verbal informed consent obtained.  Time-out conducted.  Noted no overlying erythema, induration, or other signs of local infection. Ultrasound-guided left glenohumeral injection: After sterile prep with Betadine, injected 4 cc 0.25% bupivocaine without epinephrine and 6 mg betamethasone using a 22-gauge spinal needle, passing the needle from posterior approach into the glenohumeral joint.  Injectate was seen filling the joint capsule.  When steroid was injected, it appeared that there was a small tear in the posterior labrum.

## 2021-03-30 ENCOUNTER — Ambulatory Visit: Payer: 59 | Attending: Physician Assistant

## 2021-03-30 ENCOUNTER — Other Ambulatory Visit: Payer: Self-pay

## 2021-03-30 DIAGNOSIS — M6281 Muscle weakness (generalized): Secondary | ICD-10-CM | POA: Insufficient documentation

## 2021-03-30 DIAGNOSIS — G8929 Other chronic pain: Secondary | ICD-10-CM | POA: Diagnosis present

## 2021-03-30 DIAGNOSIS — M545 Low back pain, unspecified: Secondary | ICD-10-CM | POA: Diagnosis present

## 2021-03-30 NOTE — Therapy (Signed)
[place], [place], [address] Phone: [phone]   Fax:  [phone]  Physical Therapy Evaluation  Patient Details  Name: Levonne Carreras MRN: [id number] Date of Birth: 01-31-1986 Referring Provider (PT): Aundra Dubin, [place]   Encounter Date: 03/30/2021   PT End of Session - 03/30/21 1128    Visit Number 1    Number of Visits 17    Date for PT Re-Evaluation 05/26/21    Authorization Type [employer] and [employer] MCD - FOTO 6th and 10th    PT Start Time 1130    PT Stop Time 1213    PT Time Calculation (min) 43 min    Activity Tolerance Patient tolerated treatment well    Behavior During Therapy [facility] for tasks assessed/performed           Past Medical History:  Diagnosis Date  . Depression    post partum  . GERD (gastroesophageal reflux disease)   . Systemic lupus erythematosus ([place])   . Umbilical hernia     Past Surgical History:  Procedure Laterality Date  . BREAST LUMPECTOMY WITH RADIOACTIVE SEED LOCALIZATION Right 03/09/2020   Procedure: RIGHT BREAST LUMPECTOMY WITH RADIOACTIVE SEED LOCALIZATION;  Surgeon: Erroll Luna, MD;  Location: [place];  Service: General;  Laterality: Right;  . CARPAL TUNNEL RELEASE Right 09/26/2018   Procedure: RIGHT CARPAL TUNNEL RELEASE;  Surgeon: Leandrew Koyanagi, MD;  Location: [place];  Service: Orthopedics;  Laterality: Right;  . CARPAL TUNNEL RELEASE Left 2017  . CESAREAN SECTION N/A 08/05/2014   Procedure: CESAREAN SECTION;  Surgeon: Jerelyn Charles, MD;  Location: [facility];  Service: Obstetrics;  Laterality: N/A;  . KNEE SURGERY Left 2000  . LAPAROSCOPIC BILATERAL SALPINGECTOMY N/A 12/13/2015   Procedure: LAPAROSCOPIC BILATERAL SALPINGECTOMY;  Surgeon: Emily Filbert, MD;  Location: [facility];  Service: Gynecology;  Laterality: N/A;  . PERINEOPLASTY N/A 02/20/2016   Procedure: PERINEOPLASTY;  Surgeon: Emily Filbert, MD;  Location: [facility];   Service: Gynecology;  Laterality: N/A;  . PUBOVAGINAL SLING N/A 12/13/2015   Procedure: Gaynelle Arabian;  Surgeon: Emily Filbert, MD;  Location: [facility];  Service: Gynecology;  Laterality: N/A;  . RECTOCELE REPAIR N/A 02/20/2016   Procedure: POSTERIOR REPAIR (RECTOCELE);  Surgeon: Emily Filbert, MD;  Location: [facility];  Service: Gynecology;  Laterality: N/A;  . UMBILICAL HERNIA REPAIR  2011    There were no vitals filed for this visit.    Subjective Assessment - 03/30/21 1133    Subjective Pt is a pleasant 35 y/o who presents to PT with reports of somewhat chronic R sided lower back and R posterior hip pain. She denies paresthesias down R LE and notes pain is isolated to lower back and posterior hip. She also has had recent onset of L shoulder pain, with recent injection. Pt notes that MD states she will have upcoming MRI of L shoulder if pain continues. Denies red flag items such as saddle anesthesia or bowel/bladder changes. Pt promotes recent increase in pain after fall while skiing, stating she sustained a concussion and "broke her tailbone". Pt also notes this pain has been chronic for a few years and increased during pregnancy of her 4th child, delivered by C-Section. Pt has had previous OPPT with success for past R shoulder and lower back pain.    Limitations Lifting;Standing;Walking;Sitting    How long can you sit comfortably? 20 minutes    How long can you stand comfortably?  30-35 minutes    How long can you walk comfortably? as far as needed    Diagnostic tests Lumbar X-Ray 03/28/21: Mild to moderate facet degenerative changes L5-S1    Patient Stated Goals Pt wants to decrease lower back pain in order to work and care for family with improved comfort    Currently in Pain? Yes    Pain Score 6    12/10 at worst in last two weeks   Pain Location Back    Pain Orientation Right;Lower    Pain Descriptors / Indicators Burning    Pain Type Chronic pain    Pain Onset More than a month ago     Pain Frequency Constant    Aggravating Factors  fwd bending, lifitng, prolonged sitting    Pain Relieving Factors nothing    Multiple Pain Sites Yes    Pain Score 7    Pain Location Shoulder    Pain Orientation Left    Pain Descriptors / Indicators Sharp    Pain Type Acute pain    Pain Onset 1 to 4 weeks ago    Pain Frequency Constant              [facility] PT Assessment - 03/30/21 0001      Assessment   Medical Diagnosis M54.50 (ICD-10-CM) - Low back pain, unspecified back pain laterality, unspecified chronicity, unspecified whether sciatica present    Referring Provider (PT) Aundra Dubin, PA-C    Hand Dominance Right    Next MD Visit nothing scheduled at the moment    Prior Therapy Yes - OPPT for R shoulder and low back      Precautions   Precautions None      Restrictions   Weight Bearing Restrictions No      Balance Screen   Has the patient fallen in the past 6 months Yes    How many times? 1 - fall while skiing    Has the patient had a decrease in activity level because of a fear of falling?  No    Is the patient reluctant to leave their home because of a fear of falling?  No      Home Environment   Additional Comments Lives at home with 4 children and spouse; no barriers with home entry/navigation      Prior Function   Level of Independence Independent with basic ADLs;Independent   exaccerbation of lupus depending   Vocation Full time employment    Vocation Requirements works in Education administrator   Overall Cognitive Status Within Functional Limits for tasks assessed    Attention Focused      Observation/Other Assessments   Focus on Therapeutic Outcomes (FOTO)  40% function      Functional Tests   Functional tests Squat      Squat   Comments No increase in pain with deep squat      AROM   Overall AROM Comments Lumbar flex/ext WNL      Strength   Right Hip Flexion 4+/5   slight pain   Right Hip ABduction 5/5    Right Hip ADduction 5/5    Left  Hip Flexion 5/5    Left Hip ABduction 5/5    Left Hip ADduction 5/5    Right Knee Flexion 5/5    Right Knee Extension 5/5    Left Knee Flexion 5/5    Left Knee Extension 5/5    Right Ankle Dorsiflexion 5/5  Right Ankle Plantar Flexion 5/5    Left Ankle Dorsiflexion 5/5    Left Ankle Plantar Flexion 5/5      Palpation   Palpation comment no TTP to lumbar paraspinals; concordant pain with spring to L4/5 and just distal to R PSIS      Special Tests   Other special tests Crossed SLR (+) Left      Straight Leg Raise   Findings Negative    Side  Right      Pelvic Compression   Findings Negative      Gaenslen's test   Findings Negative      Sacral Compression   Findings Positive    Comments midline      Transfers   Sit to Stand 7: Independent    Five time sit to stand comments  12 seconds    Stand to Sit 7: Independent    Supine to Sit 7: Independent    Sit to Supine 7: Independent      Ambulation/Gait   Ambulation/Gait Yes    Ambulation/Gait Assistance 7: Independent    Ambulation Distance (Feet) 50 Feet    Assistive device None    Gait Pattern Antalgic    Gait Comments trial of ambulation with gait belt compressing SIJ - no change in symptoms                      Objective measurements completed on examination: See above findings.       [facility] Adult PT Treatment/Exercise - 03/30/21 0001      Exercises   Exercises Lumbar      Lumbar Exercises: Stretches   Single Knee to Chest Stretch Limitations attempted, increased R sided pain    Other Lumbar Stretch Exercise attempted thomas stretch, increased pain      Lumbar Exercises: Supine   Pelvic Tilt 5 reps;5 seconds    Bridge 10 reps;3 seconds    Other Supine Lumbar Exercises abd/add isometric MET x 10 - 5 sec hold                  PT Education - 03/30/21 1232    Education Details eval findings, HEP, POC    Person(s) Educated Patient    Methods Explanation;Demonstration;Handout     Comprehension Verbalized understanding;Returned demonstration            PT Short Term Goals - 03/30/21 1238      PT SHORT TERM GOAL #1   Title Pt will be compliant and knowledgeable with 90% of HEP    Baseline initial HEP given    Time 3    Period Weeks    Status New    Target Date 04/20/21      PT SHORT TERM GOAL #2   Title Pt will self report R sided LBP no greater than 7/10 at worst for improved comfort    Baseline 10/10 at worst    Time 3    Period Weeks    Status New    Target Date 04/20/21             PT Long Term Goals - 03/30/21 1240      PT LONG TERM GOAL #1   Title Pt will improve FOTO function score to at least 66% in order to improve comfort and functional ability    Baseline 40% function    Time 8    Period Weeks    Status New    Target Date 05/25/21  PT LONG TERM GOAL #2   Title Pt will improve standing tolerance to greater than 1 hour in order to improve functional ability and ADL performance    Baseline 30-35 minutes    Time 8    Period Weeks    Status New    Target Date 05/25/21      PT LONG TERM GOAL #3   Title Pt will self report R sided LBP no greater than 4/10 at worst in order to improve comfort and function    Baseline 10/10 at worst    Time 8    Period Weeks    Status New    Target Date 05/25/21      PT LONG TERM GOAL #4   Title Pt will be knowledgeable of final HEP for improved carryover and functional ability    Baseline initial HEP given    Time 8    Period Weeks    Status New    Target Date 05/25/21                  Plan - 03/30/21 1301    Clinical Impression Statement Pt is a pleasant 35 y/o who presents to PT with reports of somewhat chronic R sided lower back and R posterior hip pain. Physical findings are consistent with referring provider impression, as she has concordant pain with fwd bending and spring testing to lower back and PSIS. Due to documented s/s, unable to rule out possibillity of SIJ  contributions to R sided lower back pain. Pt would benefit from skilled PT services working on increasing core and proximal hip strength as well as manual therapy interventions to decrease lower back pain. Will assess response to initial HEP with no adverse effect.    Personal Factors and Comorbidities Time since onset of injury/illness/exacerbation    Examination-Activity Limitations Stand;Squat;Stairs;Carry;Transfers    Examination-Participation Restrictions Occupation;Shop;Yard Work;Community Activity    Stability/Clinical Decision Making Stable/Uncomplicated    Clinical Decision Making Low    Rehab Potential Good    PT Frequency 2x / week    PT Duration 8 weeks    PT Treatment/Interventions ADLs/Self Care Home Management;Cryotherapy;Electrical Stimulation;Iontophoresis 45m/ml Dexamethasone;Neuromuscular re-education;Patient/family education;Therapeutic exercise;Therapeutic activities;Manual techniques;Passive range of motion;Taping;Dry needling;Traction;Ultrasound;Moist Heat;Spinal Manipulations;Joint Manipulations    PT Next Visit Plan assess tolerance to HEP; assess loaded spinal flexion; progress strengthening exercises as able    PT Home Exercise Plan Access Code: [email or web address]   Consulted and Agree with Plan of Care Patient           Patient will benefit from skilled therapeutic intervention in order to improve the following deficits and impairments:  Decreased activity tolerance,Decreased endurance,Decreased strength,Difficulty walking,Pain  Visit Diagnosis: Muscle weakness (generalized) - Plan: PT plan of care cert/re-cert  Chronic right-sided low back pain without sciatica - Plan: PT plan of care cert/re-cert     Problem List Patient Active Problem List   Diagnosis Date Noted  . Pain of left side of body 07/02/2019  . Paresthesia 07/02/2019  . Vulvovaginal candidiasis 06/23/2019  . Gastroesophageal reflux disease 01/27/2019  . Systemic lupus erythematosus ([place] 11/19/2018   . Raynaud's disease without gangrene 11/19/2018  . High risk medication use 11/19/2018  . History of bilateral carpal tunnel release 11/19/2018  . Autoimmune disease ([place] 10/08/2018  . Carpal tunnel syndrome, right upper limb 09/26/2018  . Radicular pain in right arm 09/16/2018  . History of pregnancy induced hypertension 08/03/2014  . History of pre-eclampsia 08/03/2014  . Chronic back pain 01/14/2014  .  Allergic rhinitis 01/14/2014    Ward Chatters, PT, DPT 03/30/21 1:37 PM  [facility] [address] [place], [place], [address] Phone: [phone]   Fax:  [phone]  Name: Randall Rampersad MRN: [id number] Date of Birth: 1986/05/03

## 2021-04-05 ENCOUNTER — Encounter: Payer: Self-pay | Admitting: Family Medicine

## 2021-04-05 ENCOUNTER — Other Ambulatory Visit: Payer: Self-pay

## 2021-04-05 ENCOUNTER — Ambulatory Visit (INDEPENDENT_AMBULATORY_CARE_PROVIDER_SITE_OTHER): Payer: 59 | Admitting: Family Medicine

## 2021-04-05 VITALS — BP 113/73 | HR 92 | Wt 168.0 lb

## 2021-04-05 DIAGNOSIS — L749 Eccrine sweat disorder, unspecified: Secondary | ICD-10-CM

## 2021-04-05 DIAGNOSIS — R631 Polydipsia: Secondary | ICD-10-CM | POA: Diagnosis not present

## 2021-04-05 DIAGNOSIS — A609 Anogenital herpesviral infection, unspecified: Secondary | ICD-10-CM

## 2021-04-05 MED ORDER — VALACYCLOVIR HCL 500 MG PO TABS: 500.0000 mg | ORAL_TABLET | Freq: Every day | ORAL | 12 refills | Status: AC

## 2021-04-05 MED ORDER — VENLAFAXINE HCL ER 150 MG PO CP24: 300.0000 mg | ORAL_CAPSULE | Freq: Every day | ORAL | 12 refills | Status: DC

## 2021-04-05 NOTE — Progress Notes (Signed)
   GYNECOLOGY PROBLEM  VISIT ENCOUNTER NOTE  Subjective:   Evelyn [name] is a 35 y.o. [phone] female here for a a problem GYN visit.  Current complaints: hot flashes, irritability, low libido. Reports weight gain as well. On ROS does have polyuria and polydipsia.   Denies abnormal vaginal bleeding, discharge, pelvic pain, problems with intercourse or other gynecologic concerns.    Gynecologic History No LMP recorded. (Menstrual status: IUD). Contraception: IUD and tubal ligation  Health Maintenance Due  Topic Date Due  . COVID-19 Vaccine (4 - Booster for Pfizer series) 01/31/2021     The following portions of the patient's history were reviewed and updated as appropriate: allergies, current medications, past family history, past medical history, past social history, past surgical history and problem list.  Review of Systems Pertinent items are noted in HPI.   Objective:  BP 113/73   Pulse 92   Wt 168 lb (76.2 kg)   BMI 31.74 kg/m  Gen: well appearing, NAD HEENT: no scleral icterus CV: RR Lung: Normal WOB Ext: warm well perfused Neck: Thyroid WNL   Assessment and Plan:  1. Polydipsia - Hemoglobin A1c  2. Sweating abnormality - Follicle stimulating hormone - venlafaxine XR (EFFEXOR-XR) 150 MG 24 hr capsule; Take 2 capsules (300 mg total) by mouth daily with breakfast.  Dispense: 60 capsule; Refill: 12  3. HSV (herpes simplex virus) anogenital infection - valACYclovir (VALTREX) 500 MG tablet; Take 1 tablet (500 mg total) by mouth daily.  Dispense: 30 tablet; Refill: 12   Please refer to After Visit Summary for other counseling recommendations.   Return in about 3 months (around 07/06/2021).  Caren Macadam, MD, MPH, ABFM Attending [place] for [facility]

## 2021-04-05 NOTE — Progress Notes (Signed)
Getting hot flashes and gaining weight, PCP checked thyroid and it was normal, PCP told pt to follow up up with GYN

## 2021-04-06 LAB — HEMOGLOBIN A1C
Est. average glucose Bld gHb Est-mCnc: 111 mg/dL
Hgb A1c MFr Bld: 5.5 % (ref 4.8–5.6)

## 2021-04-06 LAB — FOLLICLE STIMULATING HORMONE: FSH: 3.7 m[IU]/mL

## 2021-04-13 ENCOUNTER — Ambulatory Visit: Payer: 59 | Admitting: Physical Therapy

## 2021-04-18 ENCOUNTER — Ambulatory Visit: Payer: 59 | Attending: Physician Assistant

## 2021-04-18 ENCOUNTER — Telehealth: Payer: Self-pay

## 2021-04-18 NOTE — Telephone Encounter (Signed)
Left voicemail notifying patient of missed PT appointment. Reviewed attendance policy and reminded patient of next scheduled appointment and asked to call if she needs to cancel/reschedule this visit.

## 2021-04-20 ENCOUNTER — Ambulatory Visit: Payer: 59 | Admitting: Physical Therapy

## 2021-04-24 ENCOUNTER — Ambulatory Visit: Payer: 59

## 2021-04-26 ENCOUNTER — Ambulatory Visit: Payer: 59

## 2021-04-28 ENCOUNTER — Ambulatory Visit: Payer: 59 | Admitting: Orthopaedic Surgery

## 2021-05-02 ENCOUNTER — Encounter: Payer: 59 | Admitting: Physical Therapy

## 2021-05-04 ENCOUNTER — Encounter: Payer: Self-pay | Admitting: Orthopaedic Surgery

## 2021-05-04 ENCOUNTER — Ambulatory Visit (INDEPENDENT_AMBULATORY_CARE_PROVIDER_SITE_OTHER): Payer: 59 | Admitting: Orthopaedic Surgery

## 2021-05-04 DIAGNOSIS — G8929 Other chronic pain: Secondary | ICD-10-CM | POA: Diagnosis not present

## 2021-05-04 DIAGNOSIS — M545 Low back pain, unspecified: Secondary | ICD-10-CM

## 2021-05-04 DIAGNOSIS — S43432D Superior glenoid labrum lesion of left shoulder, subsequent encounter: Secondary | ICD-10-CM | POA: Diagnosis not present

## 2021-05-04 MED ORDER — MELOXICAM 7.5 MG PO TABS: 7.5000 mg | ORAL_TABLET | Freq: Every day | ORAL | 2 refills | Status: DC | PRN

## 2021-05-04 NOTE — Progress Notes (Signed)
Office Visit Note   Patient: Evelyn [name]           Date of Birth: 1986/09/27           MRN: [id number] Visit Date: 05/04/2021              Requested by: Nickola Major, MD [address] [place],  [place] [address] PCP: Nickola Major, MD   Assessment & Plan: Visit Diagnoses:  1. Labral tear of shoulder, left, subsequent encounter   2. Chronic midline low back pain without sciatica     Plan: Impression is left shoulder labral tear and chronic right lower back pain concerning for SI joint dysfunction.  At this point, we will order an MRI of the lumbar spine as well as an MR arthrogram of the left shoulder to assess for structural abnormalities.  She will follow-up with [place] once these have been completed.  Call with concerns or questions in the meantime.  Follow-Up Instructions: Return for after MRI lumbar spine/MRA left shoulder.   Orders:  No orders of the defined types were placed in this encounter.  Meds ordered this encounter  Medications  . meloxicam (MOBIC) 7.5 MG tablet    Sig: Take 1 tablet (7.5 mg total) by mouth daily as needed for up to 30 doses for pain.    Dispense:  30 tablet    Refill:  2      Procedures: No procedures performed   Clinical Data: No additional findings.   Subjective: Chief Complaint  Patient presents with  . Lower Back - Pain  . Left Shoulder - Pain    HPI patient is a pleasant 35 year old female who comes in today with recurrent left shoulder and right lower back pain.  She was seen in our office a month and a half ago for the same issues.  She was referred to Dr. Junius Roads for an ultrasound-guided cortisone injection to the left joint space which was beneficial for a few days.  It was noted during the injection that she appeared to have a posterior labral tear based on ultrasound findings.  In regards to her lower back, she was provided with a taper and sent to formal physical therapy.  She notes minimal to no relief over  the past 6 to 7 weeks of physical therapy.  She continues to have pain to both her left shoulder and right lower back.  Pain is worse with activity.  She is not get any relief with over-the-counter pain medication.  Review of Systems as detailed in HPI.  All others reviewed and are negative.   Objective: Vital Signs: There were no vitals taken for this visit.  Physical Exam well-developed well-nourished female in no acute distress.  Alert and oriented x3.  Ortho Exam unchanged left shoulder and lower back exam  Specialty Comments:  No specialty comments available.  Imaging: No new imaging   PMFS History: Patient Active Problem List   Diagnosis Date Noted  . Pain of left side of body 07/02/2019  . Paresthesia 07/02/2019  . Vulvovaginal candidiasis 06/23/2019  . Gastroesophageal reflux disease 01/27/2019  . Systemic lupus erythematosus ([place]) 11/19/2018  . Raynaud's disease without gangrene 11/19/2018  . High risk medication use 11/19/2018  . History of bilateral carpal tunnel release 11/19/2018  . Autoimmune disease ([place]) 10/08/2018  . Carpal tunnel syndrome, right upper limb 09/26/2018  . Radicular pain in right arm 09/16/2018  . History of pregnancy induced hypertension 08/03/2014  . History  of pre-eclampsia 08/03/2014  . Chronic back pain 01/14/2014  . Allergic rhinitis 01/14/2014   Past Medical History:  Diagnosis Date  . Depression    post partum  . GERD (gastroesophageal reflux disease)   . Systemic lupus erythematosus ([place])   . Umbilical hernia     Family History  Problem Relation Age of Onset  . Healthy Mother   . Healthy Sister   . Healthy Brother   . Asthma Son   . Healthy Son   . Healthy Son   . Healthy Son   . Other Father        never met her father - unsure of his history  . Healthy Daughter   . Breast cancer Cousin 26       undergoing chemo/radiation  . Heart disease Neg Hx   . Neurologic Disorder Neg Hx   . Colon cancer Neg Hx   .  Esophageal cancer Neg Hx     Past Surgical History:  Procedure Laterality Date  . BREAST LUMPECTOMY WITH RADIOACTIVE SEED LOCALIZATION Right 03/09/2020   Procedure: RIGHT BREAST LUMPECTOMY WITH RADIOACTIVE SEED LOCALIZATION;  Surgeon: Erroll Luna, MD;  Location: [place];  Service: General;  Laterality: Right;  . CARPAL TUNNEL RELEASE Right 09/26/2018   Procedure: RIGHT CARPAL TUNNEL RELEASE;  Surgeon: Leandrew Koyanagi, MD;  Location: [place];  Service: Orthopedics;  Laterality: Right;  . CARPAL TUNNEL RELEASE Left 2017  . CESAREAN SECTION N/A 08/05/2014   Procedure: CESAREAN SECTION;  Surgeon: Jerelyn Charles, MD;  Location: [facility];  Service: Obstetrics;  Laterality: N/A;  . KNEE SURGERY Left 2000  . LAPAROSCOPIC BILATERAL SALPINGECTOMY N/A 12/13/2015   Procedure: LAPAROSCOPIC BILATERAL SALPINGECTOMY;  Surgeon: Emily Filbert, MD;  Location: [facility];  Service: Gynecology;  Laterality: N/A;  . PERINEOPLASTY N/A 02/20/2016   Procedure: PERINEOPLASTY;  Surgeon: Emily Filbert, MD;  Location: [facility];  Service: Gynecology;  Laterality: N/A;  . PUBOVAGINAL SLING N/A 12/13/2015   Procedure: Gaynelle Arabian;  Surgeon: Emily Filbert, MD;  Location: [facility];  Service: Gynecology;  Laterality: N/A;  . RECTOCELE REPAIR N/A 02/20/2016   Procedure: POSTERIOR REPAIR (RECTOCELE);  Surgeon: Emily Filbert, MD;  Location: [facility];  Service: Gynecology;  Laterality: N/A;  . UMBILICAL HERNIA REPAIR  2011   Social History   Occupational History  . Occupation: Magazine features editor - part-time  Tobacco Use  . Smoking status: Never Smoker  . Smokeless tobacco: Never Used  Vaping Use  . Vaping Use: Never used  Substance and Sexual Activity  . Alcohol use: Not Currently  . Drug use: No  . Sexual activity: Yes    Partners: Male    Birth control/protection: Surgical, I.U.D.

## 2021-05-10 ENCOUNTER — Encounter: Payer: 59 | Admitting: Physical Therapy

## 2021-05-17 ENCOUNTER — Encounter: Payer: 59 | Admitting: Physical Therapy

## 2021-05-18 ENCOUNTER — Other Ambulatory Visit: Payer: 59

## 2021-05-18 ENCOUNTER — Inpatient Hospital Stay: Admission: RE | Admit: 2021-05-18 | Payer: 59 | Source: Ambulatory Visit

## 2021-06-06 ENCOUNTER — Ambulatory Visit
Admission: RE | Admit: 2021-06-06 | Discharge: 2021-06-06 | Disposition: A | Discharge status: Home / Self Care | Payer: 59 | Source: Ambulatory Visit | Attending: Orthopaedic Surgery | Admitting: Orthopaedic Surgery

## 2021-06-06 ENCOUNTER — Other Ambulatory Visit: Payer: Self-pay

## 2021-06-06 DIAGNOSIS — M545 Low back pain, unspecified: Secondary | ICD-10-CM

## 2021-06-06 DIAGNOSIS — S43432D Superior glenoid labrum lesion of left shoulder, subsequent encounter: Secondary | ICD-10-CM

## 2021-06-06 MED ORDER — IOPAMIDOL (ISOVUE-M 200) INJECTION 41%
15.0000 mL | Freq: Once | INTRAMUSCULAR | Status: AC
  Administered 2021-06-06: 15 mL via INTRA_ARTICULAR

## 2021-06-14 ENCOUNTER — Encounter: Payer: Self-pay | Admitting: Orthopaedic Surgery

## 2021-06-14 ENCOUNTER — Ambulatory Visit (INDEPENDENT_AMBULATORY_CARE_PROVIDER_SITE_OTHER): Payer: 59 | Admitting: Orthopaedic Surgery

## 2021-06-14 DIAGNOSIS — M25512 Pain in left shoulder: Secondary | ICD-10-CM | POA: Diagnosis not present

## 2021-06-14 DIAGNOSIS — G8929 Other chronic pain: Secondary | ICD-10-CM

## 2021-06-14 DIAGNOSIS — M5442 Lumbago with sciatica, left side: Secondary | ICD-10-CM | POA: Diagnosis not present

## 2021-06-14 NOTE — Progress Notes (Signed)
Office Visit Note   Patient: Evelyn [name]           Date of Birth: [birth year]           MRN: [id number] Visit Date: 06/14/2021              Requested by: Nickola Major, MD [address] [place],  [place] [address] PCP: Nickola Major, MD   Assessment & Plan: Visit Diagnoses:  1. Chronic left shoulder pain   2. Chronic left-sided low back pain with left-sided sciatica     Plan: Impression is chronic left shoulder pain and chronic left lower back pain and left lower extremity radiculopathy.  The patient has tried cortisone injections to the left shoulder in addition to steroid tapers as well as physical therapy without relief of symptoms.  MRI results of the left shoulder showed nothing more than mild subscapularis tendinitis.  MRI of the lumbar spine was unremarkable.  At this point, we have ruled out structural abnormalities to the left shoulder and lower back.  It is hard for [place] to explain the patient's current pain so we would like to refer her back to her rheumatologist for which she currently sees for lupus.  She would like a new referral to [facility] rheumatology which we are happy to make.  Follow-Up Instructions: Return if symptoms worsen or fail to improve.   Orders:  No orders of the defined types were placed in this encounter.  No orders of the defined types were placed in this encounter.     Procedures: No procedures performed   Clinical Data: No additional findings.   Subjective: Chief Complaint  Patient presents with   Left Shoulder - Pain, Follow-up   Lower Back - Pain, Follow-up    HPI patient is a pleasant 35 year old female who comes in today to discuss MRI results of the left shoulder and lumbar spine.  She has been dealing with chronic left shoulder pain and left back pain with left lower extremity radiculopathy for many months.  She has tried intra-articular cortisone injection in the left shoulder in addition to a steroid taper  for her back as well as formal physical therapy all without relief of symptoms.  Subsequent MRI of the left shoulder was ordered which showed mild subscapularis tendinitis.  Nothing more there.  MRI of the lumbar spine was unremarkable.  Vital Signs: There were no vitals taken for this visit.    Ortho Exam stable left shoulder and lumbar spine exams  Specialty Comments:  No specialty comments available.  Imaging: No new imaging   PMFS History: Patient Active Problem List   Diagnosis Date Noted   Pain of left side of body 07/02/2019   Paresthesia 07/02/2019   Vulvovaginal candidiasis 06/23/2019   Gastroesophageal reflux disease 01/27/2019   Systemic lupus erythematosus ([place]) 11/19/2018   Raynaud's disease without gangrene 11/19/2018   High risk medication use 11/19/2018   History of bilateral carpal tunnel release 11/19/2018   Autoimmune disease ([place]) 10/08/2018   Carpal tunnel syndrome, right upper limb 09/26/2018   Radicular pain in right arm 09/16/2018   History of pregnancy induced hypertension 08/03/2014   History of pre-eclampsia 08/03/2014   Chronic back pain 01/14/2014   Allergic rhinitis 01/14/2014   Past Medical History:  Diagnosis Date   Depression    post partum   GERD (gastroesophageal reflux disease)    Systemic lupus erythematosus ([place])    Umbilical hernia     Family History  Problem Relation Age of Onset   Healthy Mother    Healthy Sister    Healthy Brother    Asthma Son    Healthy Son    Healthy Son    Healthy Son    Other Father        never met her father - unsure of his history   Healthy Daughter    Breast cancer Cousin 34       undergoing chemo/radiation   Heart disease Neg Hx    Neurologic Disorder Neg Hx    Colon cancer Neg Hx    Esophageal cancer Neg Hx     Past Surgical History:  Procedure Laterality Date   BREAST LUMPECTOMY WITH RADIOACTIVE SEED LOCALIZATION Right 03/09/2020   Procedure: RIGHT BREAST LUMPECTOMY WITH RADIOACTIVE  SEED LOCALIZATION;  Surgeon: Erroll Luna, MD;  Location: [place];  Service: General;  Laterality: Right;   CARPAL TUNNEL RELEASE Right 09/26/2018   Procedure: RIGHT CARPAL TUNNEL RELEASE;  Surgeon: Leandrew Koyanagi, MD;  Location: [place];  Service: Orthopedics;  Laterality: Right;   CARPAL TUNNEL RELEASE Left 2017   CESAREAN SECTION N/A 08/05/2014   Procedure: CESAREAN SECTION;  Surgeon: Jerelyn Charles, MD;  Location: [facility];  Service: Obstetrics;  Laterality: N/A;   KNEE SURGERY Left 2000   LAPAROSCOPIC BILATERAL SALPINGECTOMY N/A 12/13/2015   Procedure: LAPAROSCOPIC BILATERAL SALPINGECTOMY;  Surgeon: Emily Filbert, MD;  Location: [facility];  Service: Gynecology;  Laterality: N/A;   PERINEOPLASTY N/A 02/20/2016   Procedure: PERINEOPLASTY;  Surgeon: Emily Filbert, MD;  Location: [facility];  Service: Gynecology;  Laterality: N/A;   PUBOVAGINAL SLING N/A 12/13/2015   Procedure: Gaynelle Arabian;  Surgeon: Emily Filbert, MD;  Location: [facility];  Service: Gynecology;  Laterality: N/A;   RECTOCELE REPAIR N/A 02/20/2016   Procedure: POSTERIOR REPAIR (RECTOCELE);  Surgeon: Emily Filbert, MD;  Location: [facility];  Service: Gynecology;  Laterality: N/A;   UMBILICAL HERNIA REPAIR  2011   Social History   Occupational History   Occupation: Magazine features editor - part-time  Tobacco Use   Smoking status: Never   Smokeless tobacco: Never  Vaping Use   Vaping Use: Never used  Substance and Sexual Activity   Alcohol use: Not Currently   Drug use: No   Sexual activity: Yes    Partners: Male    Birth control/protection: Surgical, I.U.D.

## 2021-06-15 ENCOUNTER — Other Ambulatory Visit: Payer: Self-pay

## 2021-06-15 DIAGNOSIS — M329 Systemic lupus erythematosus, unspecified: Secondary | ICD-10-CM

## 2021-06-16 ENCOUNTER — Encounter: Payer: Self-pay | Admitting: Radiology

## 2021-07-20 ENCOUNTER — Other Ambulatory Visit: Payer: Self-pay | Admitting: Physician Assistant

## 2021-08-09 ENCOUNTER — Ambulatory Visit: Payer: 59 | Admitting: Family Medicine

## 2021-09-15 ENCOUNTER — Ambulatory Visit: Payer: 59 | Admitting: Allergy

## 2021-09-21 ENCOUNTER — Encounter: Payer: Self-pay | Admitting: *Deleted

## 2021-09-21 ENCOUNTER — Other Ambulatory Visit: Payer: Self-pay | Admitting: Family Medicine

## 2021-09-21 DIAGNOSIS — L749 Eccrine sweat disorder, unspecified: Secondary | ICD-10-CM

## 2021-09-21 MED ORDER — VENLAFAXINE HCL ER 75 MG PO CP24: 225.0000 mg | ORAL_CAPSULE | Freq: Every day | ORAL | 12 refills | Status: DC

## 2021-10-03 ENCOUNTER — Other Ambulatory Visit: Payer: Self-pay

## 2021-10-03 ENCOUNTER — Ambulatory Visit (INDEPENDENT_AMBULATORY_CARE_PROVIDER_SITE_OTHER): Payer: BC Managed Care – PPO | Admitting: Orthopaedic Surgery

## 2021-10-03 ENCOUNTER — Ambulatory Visit (INDEPENDENT_AMBULATORY_CARE_PROVIDER_SITE_OTHER): Payer: BC Managed Care – PPO

## 2021-10-03 ENCOUNTER — Encounter: Payer: Self-pay | Admitting: Orthopaedic Surgery

## 2021-10-03 DIAGNOSIS — G8929 Other chronic pain: Secondary | ICD-10-CM

## 2021-10-03 DIAGNOSIS — M25562 Pain in left knee: Secondary | ICD-10-CM | POA: Diagnosis not present

## 2021-10-03 MED ORDER — METHYLPREDNISOLONE ACETATE 40 MG/ML IJ SUSP
40.0000 mg | INTRAMUSCULAR | Status: AC | PRN
  Administered 2021-10-03: 40 mg via INTRA_ARTICULAR

## 2021-10-03 MED ORDER — BUPIVACAINE HCL 0.5 % IJ SOLN
2.0000 mL | INTRAMUSCULAR | Status: AC | PRN
  Administered 2021-10-03: 2 mL via INTRA_ARTICULAR

## 2021-10-03 MED ORDER — LIDOCAINE HCL 1 % IJ SOLN
2.0000 mL | INTRAMUSCULAR | Status: AC | PRN
  Administered 2021-10-03: 2 mL

## 2021-10-03 NOTE — Progress Notes (Signed)
Office Visit Note   Patient: Evelyn [name]           Date of Birth: September 02, 1986           MRN: [id number] Visit Date: 10/03/2021              Requested by: Nickola Major, MD [address] [place],  [place] [address] PCP: Nickola Major, MD   Assessment & Plan: Visit Diagnoses:  1. Chronic pain of left knee     Plan: At this point, [name] has had chronic left knee pain despite conservative management therefore we will obtain a left knee MRI to rule out structural normalities.  Cortisone injection administered today.  Follow-up after the MRI.  Follow-Up Instructions: No follow-ups on file.   Orders:  Orders Placed This Encounter  Procedures  . XR KNEE 3 VIEW LEFT  . MR Knee Left w/o contrast   No orders of the defined types were placed in this encounter.     Procedures: Large Joint Inj: L knee on 10/03/2021 4:29 PM Details: 22 G needle Medications: 2 mL bupivacaine 0.5 %; 2 mL lidocaine 1 %; 40 mg methylPREDNISolone acetate 40 MG/ML Outcome: tolerated well, no immediate complications Patient was prepped and draped in the usual sterile fashion.      Clinical Data: No additional findings.   Subjective: Chief Complaint  Patient presents with  . Left Knee - Pain  . Lower Back - Pain, Follow-up    [name] comes in today for evaluation of chronic left knee pain.  We have seen her in the past for this issue and she has had a cortisone injection in the past with relief.  She feels that it is swollen.   Review of Systems  Constitutional: Negative.   HENT: Negative.    Eyes: Negative.   Respiratory: Negative.    Cardiovascular: Negative.   Endocrine: Negative.   Musculoskeletal: Negative.   Neurological: Negative.   Hematological: Negative.   Psychiatric/Behavioral: Negative.    All other systems reviewed and are negative.   Objective: Vital Signs: There were no vitals taken for this visit.  Physical Exam Vitals and nursing note reviewed.   Constitutional:      Appearance: She is well-developed.  Pulmonary:     Effort: Pulmonary effort is normal.  Skin:    General: Skin is warm.     Capillary Refill: Capillary refill takes less than 2 seconds.  Neurological:     Mental Status: She is alert and oriented to person, place, and time.  Psychiatric:        Behavior: Behavior normal.        Thought Content: Thought content normal.        Judgment: Judgment normal.    Ortho Exam  Left knee shows a trace effusion.  Full range of motion without crepitus.  Collaterals and cruciates are stable.  Specialty Comments:  No specialty comments available.  Imaging: No results found.   PMFS History: Patient Active Problem List   Diagnosis Date Noted  . Pain of left side of body 07/02/2019  . Paresthesia 07/02/2019  . Vulvovaginal candidiasis 06/23/2019  . Gastroesophageal reflux disease 01/27/2019  . Systemic lupus erythematosus ([place]) 11/19/2018  . Raynaud's disease without gangrene 11/19/2018  . High risk medication use 11/19/2018  . History of bilateral carpal tunnel release 11/19/2018  . Autoimmune disease ([place]) 10/08/2018  . Carpal tunnel syndrome, right upper limb 09/26/2018  . Radicular pain in right arm 09/16/2018  .  History of pregnancy induced hypertension 08/03/2014  . History of pre-eclampsia 08/03/2014  . Chronic back pain 01/14/2014  . Allergic rhinitis 01/14/2014   Past Medical History:  Diagnosis Date  . Depression    post partum  . GERD (gastroesophageal reflux disease)   . Systemic lupus erythematosus ([place])   . Umbilical hernia     Family History  Problem Relation Age of Onset  . Healthy Mother   . Healthy Sister   . Healthy Brother   . Asthma Son   . Healthy Son   . Healthy Son   . Healthy Son   . Other Father        never met her father - unsure of his history  . Healthy Daughter   . Breast cancer Cousin 26       undergoing chemo/radiation  . Heart disease Neg Hx   . Neurologic  Disorder Neg Hx   . Colon cancer Neg Hx   . Esophageal cancer Neg Hx     Past Surgical History:  Procedure Laterality Date  . BREAST LUMPECTOMY WITH RADIOACTIVE SEED LOCALIZATION Right 03/09/2020   Procedure: RIGHT BREAST LUMPECTOMY WITH RADIOACTIVE SEED LOCALIZATION;  Surgeon: Erroll Luna, MD;  Location: [place];  Service: General;  Laterality: Right;  . CARPAL TUNNEL RELEASE Right 09/26/2018   Procedure: RIGHT CARPAL TUNNEL RELEASE;  Surgeon: Leandrew Koyanagi, MD;  Location: [place];  Service: Orthopedics;  Laterality: Right;  . CARPAL TUNNEL RELEASE Left 2017  . CESAREAN SECTION N/A 08/05/2014   Procedure: CESAREAN SECTION;  Surgeon: Jerelyn Charles, MD;  Location: [facility];  Service: Obstetrics;  Laterality: N/A;  . KNEE SURGERY Left 2000  . LAPAROSCOPIC BILATERAL SALPINGECTOMY N/A 12/13/2015   Procedure: LAPAROSCOPIC BILATERAL SALPINGECTOMY;  Surgeon: Emily Filbert, MD;  Location: [facility];  Service: Gynecology;  Laterality: N/A;  . PERINEOPLASTY N/A 02/20/2016   Procedure: PERINEOPLASTY;  Surgeon: Emily Filbert, MD;  Location: [facility];  Service: Gynecology;  Laterality: N/A;  . PUBOVAGINAL SLING N/A 12/13/2015   Procedure: Gaynelle Arabian;  Surgeon: Emily Filbert, MD;  Location: [facility];  Service: Gynecology;  Laterality: N/A;  . RECTOCELE REPAIR N/A 02/20/2016   Procedure: POSTERIOR REPAIR (RECTOCELE);  Surgeon: Emily Filbert, MD;  Location: [facility];  Service: Gynecology;  Laterality: N/A;  . UMBILICAL HERNIA REPAIR  2011   Social History   Occupational History  . Occupation: Magazine features editor - part-time  Tobacco Use  . Smoking status: Never  . Smokeless tobacco: Never  Vaping Use  . Vaping Use: Never used  Substance and Sexual Activity  . Alcohol use: Not Currently  . Drug use: No  . Sexual activity: Yes    Partners: Male    Birth control/protection: Surgical, I.U.D.

## 2021-10-09 ENCOUNTER — Encounter (HOSPITAL_BASED_OUTPATIENT_CLINIC_OR_DEPARTMENT_OTHER): Payer: Self-pay

## 2021-10-09 ENCOUNTER — Other Ambulatory Visit: Payer: Self-pay

## 2021-10-09 ENCOUNTER — Emergency Department (HOSPITAL_BASED_OUTPATIENT_CLINIC_OR_DEPARTMENT_OTHER)
Admission: EM | Admit: 2021-10-09 | Discharge: 2021-10-10 | Disposition: A | Discharge status: Home / Self Care | Payer: BC Managed Care – PPO | Attending: Emergency Medicine | Admitting: Emergency Medicine

## 2021-10-09 DIAGNOSIS — R0981 Nasal congestion: Secondary | ICD-10-CM | POA: Diagnosis not present

## 2021-10-09 DIAGNOSIS — R509 Fever, unspecified: Secondary | ICD-10-CM | POA: Insufficient documentation

## 2021-10-09 DIAGNOSIS — Z20822 Contact with and (suspected) exposure to covid-19: Secondary | ICD-10-CM | POA: Insufficient documentation

## 2021-10-09 DIAGNOSIS — R059 Cough, unspecified: Secondary | ICD-10-CM | POA: Diagnosis present

## 2021-10-09 DIAGNOSIS — Z5321 Procedure and treatment not carried out due to patient leaving prior to being seen by health care provider: Secondary | ICD-10-CM | POA: Diagnosis not present

## 2021-10-09 LAB — RESP PANEL BY RT-PCR (FLU A&B, COVID) ARPGX2
Influenza A by PCR: NEGATIVE
Influenza B by PCR: NEGATIVE
SARS Coronavirus 2 by RT PCR: NEGATIVE

## 2021-10-09 NOTE — ED Triage Notes (Signed)
Patient here POV from Home with Flu-Like Symptoms.   Patient and Family have been symptomatic for approximately 2 weeks. Symptoms include Cough, Congestion and Subjective Fevers.  NAD Noted during Triage. A&Ox4. GCS 15. Ambulatory.

## 2021-10-20 ENCOUNTER — Other Ambulatory Visit: Payer: Self-pay

## 2021-10-20 ENCOUNTER — Ambulatory Visit
Admission: RE | Admit: 2021-10-20 | Discharge: 2021-10-20 | Disposition: A | Discharge status: Home / Self Care | Payer: Medicaid Other | Source: Ambulatory Visit | Attending: Orthopaedic Surgery | Admitting: Orthopaedic Surgery

## 2021-10-20 DIAGNOSIS — M25562 Pain in left knee: Secondary | ICD-10-CM

## 2021-10-20 DIAGNOSIS — G8929 Other chronic pain: Secondary | ICD-10-CM

## 2021-10-28 ENCOUNTER — Encounter: Payer: Self-pay | Admitting: Orthopaedic Surgery

## 2021-10-30 NOTE — Telephone Encounter (Signed)
Noted  

## 2021-10-30 NOTE — Telephone Encounter (Signed)
Please submit insurance for Synvisc thanks.

## 2021-11-09 ENCOUNTER — Telehealth: Payer: Self-pay

## 2021-11-09 NOTE — Telephone Encounter (Signed)
VOB submitted for SynviscOne, left knee. BV pending. 

## 2021-11-10 ENCOUNTER — Encounter: Payer: Self-pay | Admitting: Allergy

## 2021-11-10 ENCOUNTER — Other Ambulatory Visit: Payer: Self-pay

## 2021-11-10 ENCOUNTER — Ambulatory Visit (INDEPENDENT_AMBULATORY_CARE_PROVIDER_SITE_OTHER): Payer: BC Managed Care – PPO | Admitting: Allergy

## 2021-11-10 ENCOUNTER — Telehealth: Payer: Self-pay

## 2021-11-10 VITALS — BP 98/70 | HR 88 | Temp 98.3°F | Resp 19 | Ht 61.0 in | Wt 165.4 lb

## 2021-11-10 DIAGNOSIS — L508 Other urticaria: Secondary | ICD-10-CM | POA: Diagnosis not present

## 2021-11-10 DIAGNOSIS — J310 Chronic rhinitis: Secondary | ICD-10-CM

## 2021-11-10 DIAGNOSIS — H1013 Acute atopic conjunctivitis, bilateral: Secondary | ICD-10-CM | POA: Diagnosis not present

## 2021-11-10 DIAGNOSIS — H109 Unspecified conjunctivitis: Secondary | ICD-10-CM

## 2021-11-10 DIAGNOSIS — T781XXD Other adverse food reactions, not elsewhere classified, subsequent encounter: Secondary | ICD-10-CM

## 2021-11-10 MED ORDER — FAMOTIDINE 20 MG PO TABS: 20.0000 mg | ORAL_TABLET | Freq: Two times a day (BID) | ORAL | 5 refills | Status: DC

## 2021-11-10 MED ORDER — FEXOFENADINE HCL 180 MG PO TABS: 180.0000 mg | ORAL_TABLET | Freq: Two times a day (BID) | ORAL | 5 refills | Status: DC

## 2021-11-10 NOTE — Telephone Encounter (Signed)
PA required for SynviscOne, left knee. Submitted PA online through [employer] PA pending# [facility]

## 2021-11-10 NOTE — Patient Instructions (Signed)
Rash -description and appearance of the rash appears to be a variant of hives. Hives are intermittent and can be very itchy.  Physical features can worsen hives like pressure and change in body temperature.   At this time etiology of hives is unknown.  Hives can be caused by a variety of different triggers including illness/infection, foods, medications, stings, exercise, pressure, vibrations, extremes of temperature to name a few however majority of the time there is no identifiable trigger.   Your symptoms have been ongoing for >6 weeks making this chronic thus will obtain labwork to evaluate: CBC w diff, CMP, tryptase, hive panel, environmental panel, alpha-gal panel, inflammatory markers -for management of hives recommend you take a high-dose antihistamine regimen of Allegra 180mg  1 tab twice a day with Pepcid 20mg  1 tab twice a day (you can take Pepcid and Prevacid together as they work differently for reflux control).    -if the high-dose antihistamine regimen is not effective enough in controlling hives then you may benefit from course of Xolair which we will discuss further at future visit is needed  Adverse food reaction -continue avoidance of dairy and mushrooms -will obtain IgE levels to these foods and if positive will prescribe you an epinephrine device to have on hand  Environmental allergies -as above will obtain environmental allergy panel -continue Allegra as above -for itchy/watery eyes can use Pataday 1 drop each eye daily as needed -for nasal congestion/ear symptoms can use a nasal steroid spray like Nasacort, Rhinocort or Flonase 2 sprays each nostril daily for 1-2 weeks at a time before stopping once nasal congestion improves for maximum benefit  Follow-up in 2 months or sooner if needed

## 2021-11-10 NOTE — Progress Notes (Signed)
New Patient Note  RE: Evelyn [name] MRN: [id number] DOB: 08/25/1986 Date of Office Visit: 11/10/2021  Referring provider: Nickola Major, MD Primary care provider: Nickola Major, MD  Chief Complaint: rash  History of present illness: Evelyn [name] is a 35 y.o. female presenting today for consultation for rash.   She states she has had several episodes of rash on her arms primarily (sometimes has been on her body).  She has had issues with intermittent rash over past 2 years.  The rash appears randomly about once a month or every other month.  The rash is itchy.  She does state the rash can leave scabbing.  When present the rash is there for few days.  No swelling.  No joint aches/pains with rash. No fevers.  She describes the rash as red bumps and almost like pimples.  She last had this rash about  3 weeks ago.  She does believe the rash is worse in the summertime and if she is out in the sun.  She has taken benadryl as needed that doesn't seem to be helpful. She has tried hydrocortisone cream.  She does wear sunblock.  Rash is worse in pressure spots as well.  No preceding illnesses.  No new foods.  No new medications.  No bites or sting.  No change in soaps/detergents/body products.  She does take allegra daily for her seasonal allergies (watery eyes, itching, itching of roof of mouth, ear clogged).  The allegra helps sometimes.  She has also tried zyrtec and claritin.  She does feel allegra works the best.    She has lupus diagnosed around 2008 that is followed by rheumatologist.  She has a dermatology appointment next week.  She does believe she has a facial rash secondary to lupus.    No history of asthma.  She avoids dairy due to stomach cramping and mushrooms as caused lip swelling and mouth itching.  She has never been prescribed an epinephrine device.  No history of eczema.     Review of systems: Review of Systems  Constitutional: Negative.   HENT: Negative.     Eyes: Negative.   Respiratory: Negative.    Cardiovascular: Negative.   Gastrointestinal: Negative.   Musculoskeletal: Negative.   Skin: Negative.   Allergic/Immunologic: Negative.   Neurological: Negative.    All other systems negative unless noted above in HPI  Past medical history: Past Medical History:  Diagnosis Date   Depression    post partum   GERD (gastroesophageal reflux disease)    Systemic lupus erythematosus ([place])    Umbilical hernia     Past surgical history: Past Surgical History:  Procedure Laterality Date   BREAST LUMPECTOMY WITH RADIOACTIVE SEED LOCALIZATION Right 03/09/2020   Procedure: RIGHT BREAST LUMPECTOMY WITH RADIOACTIVE SEED LOCALIZATION;  Surgeon: Erroll Luna, MD;  Location: [place];  Service: General;  Laterality: Right;   CARPAL TUNNEL RELEASE Right 09/26/2018   Procedure: RIGHT CARPAL TUNNEL RELEASE;  Surgeon: Leandrew Koyanagi, MD;  Location: [place];  Service: Orthopedics;  Laterality: Right;   CARPAL TUNNEL RELEASE Left 2017   CESAREAN SECTION N/A 08/05/2014   Procedure: CESAREAN SECTION;  Surgeon: Jerelyn Charles, MD;  Location: [facility];  Service: Obstetrics;  Laterality: N/A;   KNEE SURGERY Left 2000   LAPAROSCOPIC BILATERAL SALPINGECTOMY N/A 12/13/2015   Procedure: LAPAROSCOPIC BILATERAL SALPINGECTOMY;  Surgeon: Emily Filbert, MD;  Location: [facility];  Service: Gynecology;  Laterality: N/A;  PERINEOPLASTY N/A 02/20/2016   Procedure: PERINEOPLASTY;  Surgeon: Emily Filbert, MD;  Location: [facility];  Service: Gynecology;  Laterality: N/A;   PUBOVAGINAL SLING N/A 12/13/2015   Procedure: Gaynelle Arabian;  Surgeon: Emily Filbert, MD;  Location: [facility];  Service: Gynecology;  Laterality: N/A;   RECTOCELE REPAIR N/A 02/20/2016   Procedure: POSTERIOR REPAIR (RECTOCELE);  Surgeon: Emily Filbert, MD;  Location: [facility];  Service: Gynecology;  Laterality: N/A;   UMBILICAL HERNIA REPAIR  2011    Family history:  Family History   Problem Relation Age of Onset   Healthy Mother    Other Father        never met her father - unsure of his history   Healthy Sister    Allergy (severe) Sister        sunblocks and mosquito repelent.   Healthy Brother    Healthy Daughter    Asthma Son    Healthy Son    Healthy Son    Healthy Son    Breast cancer Cousin 26       undergoing chemo/radiation   Heart disease Neg Hx    Neurologic Disorder Neg Hx    Colon cancer Neg Hx    Esophageal cancer Neg Hx     Social history: Lives in a home without carpeting with gas/electric heating and central/fan cooling.  Dogs in the home.  No concern for water damage, mildew or roaches in the home.  She is an Interpreter/NICU support/community liaison.  She does not report a smoking history.    Medication List: Current Outpatient Medications  Medication Sig Dispense Refill   Cholecalciferol (VITAMIN D) 50 MCG (2000 UT) CAPS Take by mouth daily.     famotidine (PEPCID) 20 MG tablet Take 1 tablet (20 mg total) by mouth 2 (two) times daily. 60 tablet 5   fexofenadine (ALLEGRA) 180 MG tablet Take 1 tablet (180 mg total) by mouth in the morning and at bedtime. 60 tablet 5   hydrocortisone 2.5 % cream APP EXT AA BID FOR FACIAL RASH     hydroxychloroquine (PLAQUENIL) 200 MG tablet Take 1 tablet by mouth twice daily Monday through Friday only. 120 tablet 0   lansoprazole (PREVACID) 30 MG capsule Take 30 mg by mouth daily at 12 noon.     LEVONORGESTREL IU 1 each by Intrauterine route once.      meloxicam (MOBIC) 7.5 MG tablet Take 1 tablet (7.5 mg total) by mouth daily as needed for up to 30 doses for pain. 30 tablet 2   triamcinolone (KENALOG) 0.1 % paste Place onto teeth.     triamcinolone cream (KENALOG) 0.1 % Apply 1 application topically 2 (two) times daily.     valACYclovir (VALTREX) 500 MG tablet Take 1 tablet (500 mg total) by mouth daily. 30 tablet 12   venlafaxine XR (EFFEXOR-XR) 75 MG 24 hr capsule Take 3 capsules (225 mg total) by  mouth daily with breakfast. 90 capsule 12   pregabalin (LYRICA) 50 MG capsule Take 100 mg by mouth daily.      No current facility-administered medications for this visit.    Known medication allergies: No Known Allergies   Physical examination: Blood pressure 98/70, pulse 88, temperature 98.3 F (36.8 C), temperature source Temporal, resp. rate 19, height $RemoveBe'5\' 1"'aabSJOWou$  (1.549 m), weight 165 lb 6.4 oz (75 kg), SpO2 98 %.  General: Alert, interactive, in no acute distress. HEENT: PERRLA, TMs pearly gray, turbinates non-edematous without discharge, post-pharynx non erythematous. Neck:  Supple without lymphadenopathy. Lungs: Clear to auscultation without wheezing, rhonchi or rales. {no increased work of breathing. CV: Normal S1, S2 without murmurs. Abdomen: Nondistended, nontender. Skin: Warm and dry, without lesions or rashes. Extremities:  No clubbing, cyanosis or edema. Neuro:   Grossly intact.  Diagnositics/Labs: Labs: Labs from 02/13/2021 Component Value Ref Range  WBC 6.3 4.4 - 11.0 x 10*3/uL  RBC 4.17 4.10 - 5.10 x 10*6/uL  Hemoglobin 12.6 12.3 - 15.3 G/DL  Hematocrit 36.0 35.9 - 44.6 %  MCV 86.4 80.0 - 96.0 FL  MCH 30.1 27.5 - 33.2 PG  MCHC 34.8 33.0 - 37.0 G/DL  RDW 13.1 %  Platelets 290 150 - 450 X 10*3/uL  MPV 8.9 6.8 - 10.2 FL  Neutrophil % 64 %  Lymphocyte % 23 %  Monocyte % 8 %  Eosinophil % 4 %  Basophil % 0 %  Neutrophil Absolute 4.0 1.8 - 7.8 x 10*3/uL  Lymphocyte Absolute 1.5 1.0 - 4.8 x 10*3/uL  Monocyte Absolute 0.5 0.0 - 0.8 x 10*3/uL  Eosinophil Absolute 0.3 0.0 - 0.5 x 10*3/uL  Basophil Absolute 0.0 0.0 - 0.2 x 10*3/uL   Sodium 139 135 - 146 MMOL/L  Potassium 4.5 3.5 - 5.3 MMOL/L  Chloride 105 98 - 110 MMOL/L  CO2 28 23 - 30 MMOL/L  BUN 9 8 - 24 MG/DL  Glucose 86 70 - 99 MG/DL  Comment: Patients taking eltrombopag at doses >/= 100 mg daily may show falsely elevated values of 10% or greater.  Creatinine 0.69 0.50 - 1.50 MG/DL  Calcium 9.3 8.5 - 10.5  MG/DL  Total Protein 7.0 6.0 - 8.3 G/DL  Comment: Patients taking eltrombopag at doses >/= 100 mg daily may show falsely elevated values of 10% or greater.  Albumin  4.5 3.5 - 5.0 G/DL  Total Bilirubin 0.3 0.1 - 1.2 MG/DL  Comment: Patients taking eltrombopag at doses >/= 100 mg daily may show falsely elevated values of 10% or greater.  Alkaline Phosphatase 112 25 - 125 IU/L or U/L  AST (SGOT) 17 5 - 40 IU/L or U/L  ALT (SGPT) 21 5 - 50 IU/L or U/L  Anion Gap 6 4 - 14 MMOL/L  Est. GFR >90 >=60 ML/MIN/1.73 M*2     Assessment and plan: Chronic urticaria    -description and appearance of the rash appears to be a variant of hives. Hives are intermittent and can be very itchy.  Physical features can worsen hives like pressure and change in body temperature.   At this time etiology of hives is unknown.  Hives can be caused by a variety of different triggers including illness/infection, foods, medications, stings, exercise, pressure, vibrations, extremes of temperature to name a few however majority of the time there is no identifiable trigger.   Your symptoms have been ongoing for >6 weeks making this chronic thus will obtain labwork to evaluate: CBC w diff, CMP, tryptase, hive panel, environmental panel, alpha-gal panel, inflammatory markers -for management of hives recommend you take a high-dose antihistamine regimen of Allegra $RemoveBe'180mg'QrvFzSzLf$  1 tab twice a day with Pepcid $RemoveBef'20mg'pouogZciNx$  1 tab twice a day (you can take Pepcid and Prevacid together as they work differently for reflux control).    -if the high-dose antihistamine regimen is not effective enough in controlling hives then you may benefit from course of Xolair which we will discuss further at future visit is needed  Adverse food reaction -continue avoidance of dairy and mushrooms -will obtain IgE levels to these foods and if  positive will prescribe you an epinephrine device to have on hand  Rhinoconjunctivitis, presumed allergic -as above will obtain  environmental allergy panel -continue Allegra as above -for itchy/watery eyes can use Pataday 1 drop each eye daily as needed -for nasal congestion/ear symptoms can use a nasal steroid spray like Nasacort, Rhinocort or Flonase 2 sprays each nostril daily for 1-2 weeks at a time before stopping once nasal congestion improves for maximum benefit  Follow-up in 2 months or sooner if needed  I appreciate the opportunity to take part in Siddhi's care. Please do not hesitate to contact me with questions.  Sincerely,   Prudy Feeler, MD Allergy/Immunology [facility]

## 2021-11-13 ENCOUNTER — Telehealth: Payer: Self-pay

## 2021-11-13 NOTE — Telephone Encounter (Signed)
Called and left a Vm for patient to CB to schedule for gel injection with Dr. Erlinda Hong.  Approved for SynviscOne, left knee. Buy & Bill Must meet deductible first Patient will be responsible for 20% OOP. No Co-pay PA Approval# [id number] Valid 11/10/2021- 05/09/2022

## 2021-11-15 ENCOUNTER — Telehealth: Payer: Self-pay | Admitting: Allergy

## 2021-11-15 ENCOUNTER — Other Ambulatory Visit: Payer: Self-pay

## 2021-11-15 ENCOUNTER — Encounter: Payer: Self-pay | Admitting: Orthopaedic Surgery

## 2021-11-15 ENCOUNTER — Ambulatory Visit (INDEPENDENT_AMBULATORY_CARE_PROVIDER_SITE_OTHER): Payer: BC Managed Care – PPO | Admitting: Orthopaedic Surgery

## 2021-11-15 DIAGNOSIS — M1712 Unilateral primary osteoarthritis, left knee: Secondary | ICD-10-CM | POA: Diagnosis not present

## 2021-11-15 MED ORDER — BUPIVACAINE HCL 0.25 % IJ SOLN
2.0000 mL | INTRAMUSCULAR | Status: AC | PRN
  Administered 2021-11-15: 10:00:00 2 mL via INTRA_ARTICULAR

## 2021-11-15 MED ORDER — TRAMADOL HCL 50 MG PO TABS: 50.0000 mg | ORAL_TABLET | Freq: Three times a day (TID) | ORAL | 2 refills | Status: AC | PRN

## 2021-11-15 MED ORDER — LIDOCAINE HCL 1 % IJ SOLN
2.0000 mL | INTRAMUSCULAR | Status: AC | PRN
  Administered 2021-11-15: 10:00:00 2 mL

## 2021-11-15 MED ORDER — HYLAN G-F 20 48 MG/6ML IX SOSY
48.0000 mg | PREFILLED_SYRINGE | INTRA_ARTICULAR | Status: AC | PRN
  Administered 2021-11-15: 48 mg via INTRA_ARTICULAR

## 2021-11-15 NOTE — Progress Notes (Signed)
Office Visit Note   Patient: Evelyn [name]           Date of Birth: [birth year]           MRN: [id number] Visit Date: 11/15/2021              Requested by: Nickola Major, MD [address] [place],  [place] [address] PCP: Nickola Major, MD   Assessment & Plan: Visit Diagnoses:  1. Unilateral primary osteoarthritis, left knee     Plan: Impression is left knee pain from underlying osteoarthritis and lupus.  Today, we proceeded with Synvisc 1 injection.  She tolerated this well.  She will follow-up with [place] as needed.  Call with concerns or questions.  Follow-Up Instructions: Return if symptoms worsen or fail to improve.   Orders:  Orders Placed This Encounter  Procedures   Large Joint Inj: R knee    Meds ordered this encounter  Medications   traMADol (ULTRAM) 50 MG tablet    Sig: Take 1 tablet (50 mg total) by mouth 3 (three) times daily as needed.    Dispense:  30 tablet    Refill:  2       Procedures: Large Joint Inj: R knee on 11/15/2021 10:23 AM Indications: pain Details: 22 G needle, anterolateral approach Medications: 2 mL lidocaine 1 %; 2 mL bupivacaine 0.25 %; 48 mg Hylan 48 MG/6ML     Clinical Data: No additional findings.   Subjective: Chief Complaint  Patient presents with   Left Knee - Follow-up    HPI patient is a pleasant 35 year old female who comes in today for Synvisc 1 injection to the left knee.  She has underlying chronic left knee pain and is tried cortisone injection in the past without relief.  She has not previously undergone viscosupplementation injection.     Objective: Vital Signs: There were no vitals taken for this visit.    Ortho Exam stable left knee exam  Specialty Comments:  No specialty comments available.  Imaging: No new imaging   PMFS History: Patient Active Problem List   Diagnosis Date Noted   Pain of left side of body 07/02/2019   Paresthesia 07/02/2019   Vulvovaginal candidiasis  06/23/2019   Gastroesophageal reflux disease 01/27/2019   Systemic lupus erythematosus ([place]) 11/19/2018   Raynaud's disease without gangrene 11/19/2018   High risk medication use 11/19/2018   History of bilateral carpal tunnel release 11/19/2018   Autoimmune disease ([place]) 10/08/2018   Carpal tunnel syndrome, right upper limb 09/26/2018   Radicular pain in right arm 09/16/2018   History of pregnancy induced hypertension 08/03/2014   History of pre-eclampsia 08/03/2014   Chronic back pain 01/14/2014   Allergic rhinitis 01/14/2014   Past Medical History:  Diagnosis Date   Depression    post partum   GERD (gastroesophageal reflux disease)    Systemic lupus erythematosus ([place])    Umbilical hernia     Family History  Problem Relation Age of Onset   Healthy Mother    Other Father        never met her father - unsure of his history   Healthy Sister    Allergy (severe) Sister        sunblocks and mosquito repelent.   Healthy Brother    Healthy Daughter    Asthma Son    Healthy Son    Healthy Son    Healthy Son    Breast cancer Cousin 26  undergoing chemo/radiation   Heart disease Neg Hx    Neurologic Disorder Neg Hx    Colon cancer Neg Hx    Esophageal cancer Neg Hx     Past Surgical History:  Procedure Laterality Date   BREAST LUMPECTOMY WITH RADIOACTIVE SEED LOCALIZATION Right 03/09/2020   Procedure: RIGHT BREAST LUMPECTOMY WITH RADIOACTIVE SEED LOCALIZATION;  Surgeon: Erroll Luna, MD;  Location: [facility];  Service: General;  Laterality: Right;   CARPAL TUNNEL RELEASE Right 09/26/2018   Procedure: RIGHT CARPAL TUNNEL RELEASE;  Surgeon: Leandrew Koyanagi, MD;  Location: [facility];  Service: Orthopedics;  Laterality: Right;   CARPAL TUNNEL RELEASE Left 2017   CESAREAN SECTION N/A 08/05/2014   Procedure: CESAREAN SECTION;  Surgeon: Jerelyn Charles, MD;  Location: [facility];  Service: Obstetrics;  Laterality: N/A;   KNEE SURGERY Left 2000    LAPAROSCOPIC BILATERAL SALPINGECTOMY N/A 12/13/2015   Procedure: LAPAROSCOPIC BILATERAL SALPINGECTOMY;  Surgeon: Emily Filbert, MD;  Location: [facility];  Service: Gynecology;  Laterality: N/A;   PERINEOPLASTY N/A 02/20/2016   Procedure: PERINEOPLASTY;  Surgeon: Emily Filbert, MD;  Location: [facility];  Service: Gynecology;  Laterality: N/A;   PUBOVAGINAL SLING N/A 12/13/2015   Procedure: Gaynelle Arabian;  Surgeon: Emily Filbert, MD;  Location: [facility];  Service: Gynecology;  Laterality: N/A;   RECTOCELE REPAIR N/A 02/20/2016   Procedure: POSTERIOR REPAIR (RECTOCELE);  Surgeon: Emily Filbert, MD;  Location: [facility];  Service: Gynecology;  Laterality: N/A;   UMBILICAL HERNIA REPAIR  2011   Social History   Occupational History   Occupation: Magazine features editor - part-time  Tobacco Use   Smoking status: Never   Smokeless tobacco: Never  Vaping Use   Vaping Use: Never used  Substance and Sexual Activity   Alcohol use: Not Currently   Drug use: No   Sexual activity: Yes    Partners: Male    Birth control/protection: Surgical, I.U.D.

## 2021-11-15 NOTE — Telephone Encounter (Signed)
Do you recommend alternatives or would you rather her try to purchase over the counter?

## 2021-11-15 NOTE — Telephone Encounter (Signed)
Pepcid and Allegra not covered by insurance and patient would like alternatives called in that would be covered. She uses [facility] on [address]

## 2021-11-15 NOTE — Telephone Encounter (Signed)
Called and spoke with the patient and provided the generic form for Pepcid and Allegra to purchase over the counter as a cheaper alternative since it is not covered by insurance. Patient verbalized understanding and will call back if she needs anything further.

## 2021-11-22 ENCOUNTER — Ambulatory Visit: Payer: BC Managed Care – PPO | Admitting: Orthopaedic Surgery

## 2021-11-22 LAB — ALLERGEN MILK: Milk IgE: <0.1 kU/L

## 2021-11-22 LAB — ALLERGENS W/TOTAL IGE AREA 2

## 2021-11-22 LAB — SEDIMENTATION RATE: Sed Rate: 15 mm/hr (ref 0–32)

## 2021-11-22 LAB — ALPHA-GAL PANEL
Allergen Lamb IgE: <0.1 kU/L
Beef IgE: <0.1 kU/L
IgE (Immunoglobulin E), Serum: 36 IU/mL (ref 6–495)
O215-IgE Alpha-Gal: <0.1 kU/L
Pork IgE: <0.1 kU/L

## 2021-11-22 LAB — ALLERGEN, MUSHROOM, RF212: Mushroom IgE: <0.1 kU/L

## 2021-11-22 LAB — TRYPTASE: Tryptase: 2.5 ug/L (ref 2.2–13.2)

## 2021-11-22 LAB — THYROID ANTIBODIES
Thyroglobulin Antibody: <1 IU/mL (ref 0.0–0.9)
Thyroperoxidase Ab SerPl-aCnc: <9 IU/mL (ref 0–34)

## 2021-11-22 LAB — CHRONIC URTICARIA: cu index: 4 (ref <10)

## 2021-12-07 ENCOUNTER — Other Ambulatory Visit: Payer: Self-pay

## 2021-12-07 ENCOUNTER — Ambulatory Visit (INDEPENDENT_AMBULATORY_CARE_PROVIDER_SITE_OTHER): Payer: BC Managed Care – PPO | Admitting: Orthopaedic Surgery

## 2021-12-07 ENCOUNTER — Encounter: Payer: Self-pay | Admitting: Orthopaedic Surgery

## 2021-12-07 DIAGNOSIS — R202 Paresthesia of skin: Secondary | ICD-10-CM | POA: Diagnosis not present

## 2021-12-07 NOTE — Progress Notes (Signed)
Office Visit Note   Patient: Evelyn [name]           Date of Birth: 1986/10/17           MRN: [id number] Visit Date: 12/07/2021              Requested by: Nickola Major, MD [address] [place],  [place] [address] PCP: Nickola Major, MD   Assessment & Plan: Visit Diagnoses:  1. Paresthesia of both hands     Plan: Impression is bilateral hand paresthesias.  Although the patient's previous nerve conduction study was unremarkable, she did get relief from carpal tunnel release to both hands.  Her symptoms appear to be similar to in the past but are actually worse so we will go ahead and repeat the nerve conduction study at this point in time.  She will follow-up with [place] once this is been completed.  In the meantime, we will provide her with bilateral Velcro wrist splints.  Call with concerns or questions in the meantime.  Follow-Up Instructions: Return for after NCS/EMG.   Orders:  No orders of the defined types were placed in this encounter.  No orders of the defined types were placed in this encounter.     Procedures: No procedures performed   Clinical Data: No additional findings.   Subjective: Chief Complaint  Patient presents with   Right Hand - Pain, Numbness   Left Hand - Pain, Numbness    HPI patient is a pleasant 36 year old female who comes in today with paresthesias to both hands left greater than right.  This is been ongoing for the past month or so.  She denies any change in activity.  She describes shocking sensations which go from her hand to the volar forearm.  This appears to be worse with driving as well as straightening her hair and at night when she is trying to sleep.  The symptoms she gets are primarily to the thumb, index, long and ring fingers.  She denies any neck pain.  Of note, she is status post left carpal tunnel release in 2017 and right carpal tunnel release in 2019.  Prior to the surgeries, nerve conduction studies were  obtained which were normal.  She does note that the surgery helped both hands.  Also, previous to the carpal tunnel surgeries that she underwent MRI of the cervical spine in 2019 which showed a small disc protrusion C5-6.  She has been recently diagnosed with lupus and is being treated by rheumatology for this.  Review of Systems As detailed in HPI.  All others reviewed and are negative.  Objective: Vital Signs: There were no vitals taken for this visit.  Physical Exam well-developed well-nourished female no acute distress.  Alert and oriented x3.  Ortho Exam cervical spine exam is unremarkable.  Left hand exam shows a negative Phalen.  Positive Tinel.  No thenar atrophy.  She is neurovascular intact distally.  Right hand exam shows negative Phalen and negative Tinel.  No thenar atrophy.  She is neurovascular intact distally.  Specialty Comments:  No specialty comments available.  Imaging: No new imaging   PMFS History: Patient Active Problem List   Diagnosis Date Noted   Pain of left side of body 07/02/2019   Paresthesia 07/02/2019   Vulvovaginal candidiasis 06/23/2019   Gastroesophageal reflux disease 01/27/2019   Systemic lupus erythematosus ([place]) 11/19/2018   Raynaud's disease without gangrene 11/19/2018   High risk medication use 11/19/2018   History  of bilateral carpal tunnel release 11/19/2018   Autoimmune disease ([place]) 10/08/2018   Carpal tunnel syndrome, right upper limb 09/26/2018   Radicular pain in right arm 09/16/2018   History of pregnancy induced hypertension 08/03/2014   History of pre-eclampsia 08/03/2014   Chronic back pain 01/14/2014   Allergic rhinitis 01/14/2014   Past Medical History:  Diagnosis Date   Depression    post partum   GERD (gastroesophageal reflux disease)    Systemic lupus erythematosus ([place])    Umbilical hernia     Family History  Problem Relation Age of Onset   Healthy Mother    Other Father        never met her father - unsure of  his history   Healthy Sister    Allergy (severe) Sister        sunblocks and mosquito repelent.   Healthy Brother    Healthy Daughter    Asthma Son    Healthy Son    Healthy Son    Healthy Son    Breast cancer Cousin 26       undergoing chemo/radiation   Heart disease Neg Hx    Neurologic Disorder Neg Hx    Colon cancer Neg Hx    Esophageal cancer Neg Hx     Past Surgical History:  Procedure Laterality Date   BREAST LUMPECTOMY WITH RADIOACTIVE SEED LOCALIZATION Right 03/09/2020   Procedure: RIGHT BREAST LUMPECTOMY WITH RADIOACTIVE SEED LOCALIZATION;  Surgeon: Erroll Luna, MD;  Location: [facility];  Service: General;  Laterality: Right;   CARPAL TUNNEL RELEASE Right 09/26/2018   Procedure: RIGHT CARPAL TUNNEL RELEASE;  Surgeon: Leandrew Koyanagi, MD;  Location: [facility];  Service: Orthopedics;  Laterality: Right;   CARPAL TUNNEL RELEASE Left 2017   CESAREAN SECTION N/A 08/05/2014   Procedure: CESAREAN SECTION;  Surgeon: Jerelyn Charles, MD;  Location: [facility];  Service: Obstetrics;  Laterality: N/A;   KNEE SURGERY Left 2000   LAPAROSCOPIC BILATERAL SALPINGECTOMY N/A 12/13/2015   Procedure: LAPAROSCOPIC BILATERAL SALPINGECTOMY;  Surgeon: Emily Filbert, MD;  Location: [facility];  Service: Gynecology;  Laterality: N/A;   PERINEOPLASTY N/A 02/20/2016   Procedure: PERINEOPLASTY;  Surgeon: Emily Filbert, MD;  Location: [facility];  Service: Gynecology;  Laterality: N/A;   PUBOVAGINAL SLING N/A 12/13/2015   Procedure: Gaynelle Arabian;  Surgeon: Emily Filbert, MD;  Location: [facility];  Service: Gynecology;  Laterality: N/A;   RECTOCELE REPAIR N/A 02/20/2016   Procedure: POSTERIOR REPAIR (RECTOCELE);  Surgeon: Emily Filbert, MD;  Location: [facility];  Service: Gynecology;  Laterality: N/A;   UMBILICAL HERNIA REPAIR  2011   Social History   Occupational History   Occupation: Magazine features editor - part-time  Tobacco Use   Smoking status: Never   Smokeless tobacco: Never  Vaping  Use   Vaping Use: Never used  Substance and Sexual Activity   Alcohol use: Not Currently   Drug use: No   Sexual activity: Yes    Partners: Male    Birth control/protection: Surgical, I.U.D.

## 2021-12-15 ENCOUNTER — Encounter: Payer: Self-pay | Admitting: Physical Medicine and Rehabilitation

## 2021-12-15 ENCOUNTER — Ambulatory Visit (INDEPENDENT_AMBULATORY_CARE_PROVIDER_SITE_OTHER): Payer: BC Managed Care – PPO | Admitting: Physical Medicine and Rehabilitation

## 2021-12-15 ENCOUNTER — Other Ambulatory Visit: Payer: Self-pay

## 2021-12-15 DIAGNOSIS — R202 Paresthesia of skin: Secondary | ICD-10-CM

## 2021-12-15 NOTE — Progress Notes (Signed)
Pt state numbness, tingling, swollen wrist and hands. Pt state she feels numbness in her right pointer, middle finger and left pointer, middle and pinky finger. Pt state the pain comes has she is driving. Pt state she takes pain meds to help ease her pain. Pt state she is right handed.  Numeric Pain Rating Scale and Functional Assessment Average Pain 5   In the last MONTH (on 0-10 scale) has pain interfered with the following?  1. General activity like being  able to carry out your everyday physical activities such as walking, climbing stairs, carrying groceries, or moving a chair?  Rating(10)

## 2021-12-19 NOTE — Procedures (Signed)
EMG & NCV Findings: Evaluation of the left median motor nerve showed reduced amplitude (4.9 mV).  All remaining nerves (as indicated in the following tables) were within normal limits.  All left vs. right side differences were within normal limits.    All examined muscles (as indicated in the following table) showed no evidence of electrical instability.    Impression: Essentially NORMAL electrodiagnostic study of both upper limbs.  There is no significant electrodiagnostic evidence of nerve entrapment, brachial plexopathy or cervical radiculopathy.    As you know, purely sensory or demyelinating radiculopathies and chemical radiculitis may not be detected with this particular electrodiagnostic study. **This electrodiagnostic study cannot rule out small fiber polyneuropathy and dysesthesias from central pain syndromes such as stroke or central pain sensitization syndromes such as fibromyalgia.  Myotomal referral pain from trigger points is also not excluded.  Recommendations: 1.  Follow-up with referring physician. 2.  Continue current management of symptoms.  ___________________________ Laurence Spates FAAPMR Board Certified, American Board of Physical Medicine and Rehabilitation    Nerve Conduction Studies Anti Sensory Summary Table   Stim Site NR Peak (ms) Norm Peak (ms) P-T Amp (V) Norm P-T Amp Site1 Site2 Delta-P (ms) Dist (cm) Vel (m/s) Norm Vel (m/s)  Left Median Acr Palm Anti Sensory (2nd Digit)  32.1C  Wrist    2.9 <3.6 21.1 >10 Wrist Palm 1.2 0.0    Palm    1.7 <2.0 6.2         Right Median Acr Palm Anti Sensory (2nd Digit)  31C  Wrist    2.9 <3.6 26.2 >10 Wrist Palm 1.4 0.0    Palm    1.5 <2.0 21.2         Left Radial Anti Sensory (Base 1st Digit)  32.1C  Wrist    2.0 <3.1 35.3  Wrist Base 1st Digit 2.0 0.0    Right Radial Anti Sensory (Base 1st Digit)  31.7C  Wrist    2.1 <3.1 15.3  Wrist Base 1st Digit 2.1 0.0    Left Ulnar Anti Sensory (5th Digit)  32.5C  Wrist     2.9 <3.7 24.8 >15.0 Wrist 5th Digit 2.9 14.0 48 >38  Right Ulnar Anti Sensory (5th Digit)  31.6C  Wrist    2.8 <3.7 23.0 >15.0 Wrist 5th Digit 2.8 14.0 50 >38   Motor Summary Table   Stim Site NR Onset (ms) Norm Onset (ms) O-P Amp (mV) Norm O-P Amp Site1 Site2 Delta-0 (ms) Dist (cm) Vel (m/s) Norm Vel (m/s)  Left Median Motor (Abd Poll Brev)  32.5C  Wrist    3.2 <4.2 *4.9 >5 Elbow Wrist 3.5 20.0 57 >50  Elbow    6.7  5.6         Right Median Motor (Abd Poll Brev)  31.6C  Wrist    3.0 <4.2 6.5 >5 Elbow Wrist 3.4 18.5 54 >50  Elbow    6.4  6.5         Left Ulnar Motor (Abd Dig Min)  32.6C  Wrist    2.5 <4.2 12.4 >3 B Elbow Wrist 2.9 19.0 66 >53  B Elbow    5.4  12.1  A Elbow B Elbow 1.2 10.0 83 >53  A Elbow    6.6  11.7         Right Ulnar Motor (Abd Dig Min)  31.6C  Wrist    2.4 <4.2 11.7 >3 B Elbow Wrist 2.8 19.0 68 >53  B Elbow    5.2  10.9  A Elbow B Elbow 1.1 10.0 91 >53  A Elbow    6.3  11.1          EMG   Side Muscle Nerve Root Ins Act Fibs Psw Amp Dur Poly Recrt Int Fraser Din Comment  Left Abd Poll Brev Median C8-T1 Nml Nml Nml Nml Nml 0 Nml Nml   Left 1stDorInt Ulnar C8-T1 Nml Nml Nml Nml Nml 0 Nml Nml   Left PronatorTeres Median C6-7 Nml Nml Nml Nml Nml 0 Nml Nml   Left Biceps Musculocut C5-6 Nml Nml Nml Nml Nml 0 Nml Nml   Left Deltoid Axillary C5-6 Nml Nml Nml Nml Nml 0 Nml Nml     Nerve Conduction Studies Anti Sensory Left/Right Comparison   Stim Site L Lat (ms) R Lat (ms) L-R Lat (ms) L Amp (V) R Amp (V) L-R Amp (%) Site1 Site2 L Vel (m/s) R Vel (m/s) L-R Vel (m/s)  Median Acr Palm Anti Sensory (2nd Digit)  32.1C  Wrist 2.9 2.9 0.0 21.1 26.2 19.5 Wrist Palm     Palm 1.7 1.5 0.2 6.2 21.2 70.8       Radial Anti Sensory (Base 1st Digit)  32.1C  Wrist 2.0 2.1 0.1 35.3 15.3 56.7 Wrist Base 1st Digit     Ulnar Anti Sensory (5th Digit)  32.5C  Wrist 2.9 2.8 0.1 24.8 23.0 7.3 Wrist 5th Digit 48 50 2   Motor Left/Right Comparison   Stim Site L Lat (ms) R Lat (ms)  L-R Lat (ms) L Amp (mV) R Amp (mV) L-R Amp (%) Site1 Site2 L Vel (m/s) R Vel (m/s) L-R Vel (m/s)  Median Motor (Abd Poll Brev)  32.5C  Wrist 3.2 3.0 0.2 *4.9 6.5 24.6 Elbow Wrist 57 54 3  Elbow 6.7 6.4 0.3 5.6 6.5 13.8       Ulnar Motor (Abd Dig Min)  32.6C  Wrist 2.5 2.4 0.1 12.4 11.7 5.6 B Elbow Wrist 66 68 2  B Elbow 5.4 5.2 0.2 12.1 10.9 9.9 A Elbow B Elbow 83 91 8  A Elbow 6.6 6.3 0.3 11.7 11.1 5.1          Waveforms:

## 2021-12-19 NOTE — Progress Notes (Signed)
Evelyn [name] - 36 y.o. female MRN [id number]  Date of birth: Feb 16, 1986  Office Visit Note: Visit Date: 12/15/2021 PCP: Nickola Major, MD Referred by: Nickola Major, MD  Subjective: Chief Complaint  Patient presents with   Right Wrist - Pain, Numbness, Tingling   Left Wrist - Pain, Numbness, Tingling   Right Hand - Pain, Numbness, Tingling, Sore   Left Hand - Pain, Numbness, Sore, Tingling   HPI:  Evelyn [name] is a 36 y.o. female who comes in today at the request of Dr. Eduard Roux for electrodiagnostic study of the Bilateral upper extremities.  Patient is Right hand dominant. The symptoms she gets are primarily to the thumb, index, long and ring fingers.  On the left hand she does endorse some symptoms into the fifth digit.  She also endorses swelling in the hands.  She denies any frank radicular symptoms or neck pain.  She had prior electrodiagnostic studies in 2016 in my office.  Those are reviewed below at the bottom of the note but were essentially normal.  Nonetheless, she underwent left carpal tunnel release in 2017 and right carpal tunnel release in 2019.  Per her report and Dr. Phoebe Sharps report this did seem to help her symptoms at the time.  She has a history of fibromyalgia.  Has been diagnosed in the past with lupus and Raynaud's syndrome.  ROS Otherwise per HPI.  Assessment & Plan: Visit Diagnoses:    ICD-10-CM   1. Paresthesia of skin  R20.2 NCV with EMG (electromyography)      Plan: Impression: Essentially NORMAL electrodiagnostic study of both upper limbs.  There is no significant electrodiagnostic evidence of nerve entrapment, brachial plexopathy or cervical radiculopathy.    As you know, purely sensory or demyelinating radiculopathies and chemical radiculitis may not be detected with this particular electrodiagnostic study. **This electrodiagnostic study cannot rule out small fiber polyneuropathy and dysesthesias from central pain syndromes such as  stroke or central pain sensitization syndromes such as fibromyalgia.  Myotomal referral pain from trigger points is also not excluded.  Recommendations: 1.  Follow-up with referring physician. 2.  Continue current management of symptoms.  Meds & Orders: No orders of the defined types were placed in this encounter.   Orders Placed This Encounter  Procedures   NCV with EMG (electromyography)    Follow-up: Return for Eduard Roux, MD as scheduled.   Procedures: No procedures performed  EMG & NCV Findings: Evaluation of the left median motor nerve showed reduced amplitude (4.9 mV).  All remaining nerves (as indicated in the following tables) were within normal limits.  All left vs. right side differences were within normal limits.    All examined muscles (as indicated in the following table) showed no evidence of electrical instability.    Impression: Essentially NORMAL electrodiagnostic study of both upper limbs.  There is no significant electrodiagnostic evidence of nerve entrapment, brachial plexopathy or cervical radiculopathy.    As you know, purely sensory or demyelinating radiculopathies and chemical radiculitis may not be detected with this particular electrodiagnostic study. **This electrodiagnostic study cannot rule out small fiber polyneuropathy and dysesthesias from central pain syndromes such as stroke or central pain sensitization syndromes such as fibromyalgia.  Myotomal referral pain from trigger points is also not excluded.  Recommendations: 1.  Follow-up with referring physician. 2.  Continue current management of symptoms.  ___________________________ Wonda Olds Board Certified, American Board of Physical Medicine and Rehabilitation    Nerve Conduction Studies Anti  Sensory Summary Table   Stim Site NR Peak (ms) Norm Peak (ms) P-T Amp (V) Norm P-T Amp Site1 Site2 Delta-P (ms) Dist (cm) Vel (m/s) Norm Vel (m/s)  Left Median Acr Palm Anti Sensory (2nd Digit)   32.1C  Wrist    2.9 <3.6 21.1 >10 Wrist Palm 1.2 0.0    Palm    1.7 <2.0 6.2         Right Median Acr Palm Anti Sensory (2nd Digit)  31C  Wrist    2.9 <3.6 26.2 >10 Wrist Palm 1.4 0.0    Palm    1.5 <2.0 21.2         Left Radial Anti Sensory (Base 1st Digit)  32.1C  Wrist    2.0 <3.1 35.3  Wrist Base 1st Digit 2.0 0.0    Right Radial Anti Sensory (Base 1st Digit)  31.7C  Wrist    2.1 <3.1 15.3  Wrist Base 1st Digit 2.1 0.0    Left Ulnar Anti Sensory (5th Digit)  32.5C  Wrist    2.9 <3.7 24.8 >15.0 Wrist 5th Digit 2.9 14.0 48 >38  Right Ulnar Anti Sensory (5th Digit)  31.6C  Wrist    2.8 <3.7 23.0 >15.0 Wrist 5th Digit 2.8 14.0 50 >38   Motor Summary Table   Stim Site NR Onset (ms) Norm Onset (ms) O-P Amp (mV) Norm O-P Amp Site1 Site2 Delta-0 (ms) Dist (cm) Vel (m/s) Norm Vel (m/s)  Left Median Motor (Abd Poll Brev)  32.5C  Wrist    3.2 <4.2 *4.9 >5 Elbow Wrist 3.5 20.0 57 >50  Elbow    6.7  5.6         Right Median Motor (Abd Poll Brev)  31.6C  Wrist    3.0 <4.2 6.5 >5 Elbow Wrist 3.4 18.5 54 >50  Elbow    6.4  6.5         Left Ulnar Motor (Abd Dig Min)  32.6C  Wrist    2.5 <4.2 12.4 >3 B Elbow Wrist 2.9 19.0 66 >53  B Elbow    5.4  12.1  A Elbow B Elbow 1.2 10.0 83 >53  A Elbow    6.6  11.7         Right Ulnar Motor (Abd Dig Min)  31.6C  Wrist    2.4 <4.2 11.7 >3 B Elbow Wrist 2.8 19.0 68 >53  B Elbow    5.2  10.9  A Elbow B Elbow 1.1 10.0 91 >53  A Elbow    6.3  11.1          EMG   Side Muscle Nerve Root Ins Act Fibs Psw Amp Dur Poly Recrt Int Fraser Din Comment  Left Abd Poll Brev Median C8-T1 Nml Nml Nml Nml Nml 0 Nml Nml   Left 1stDorInt Ulnar C8-T1 Nml Nml Nml Nml Nml 0 Nml Nml   Left PronatorTeres Median C6-7 Nml Nml Nml Nml Nml 0 Nml Nml   Left Biceps Musculocut C5-6 Nml Nml Nml Nml Nml 0 Nml Nml   Left Deltoid Axillary C5-6 Nml Nml Nml Nml Nml 0 Nml Nml     Nerve Conduction Studies Anti Sensory Left/Right Comparison   Stim Site L Lat (ms) R Lat (ms) L-R Lat  (ms) L Amp (V) R Amp (V) L-R Amp (%) Site1 Site2 L Vel (m/s) R Vel (m/s) L-R Vel (m/s)  Median Acr Palm Anti Sensory (2nd Digit)  32.1C  Wrist 2.9 2.9 0.0 21.1 26.2 19.5 Wrist  Palm     Palm 1.7 1.5 0.2 6.2 21.2 70.8       Radial Anti Sensory (Base 1st Digit)  32.1C  Wrist 2.0 2.1 0.1 35.3 15.3 56.7 Wrist Base 1st Digit     Ulnar Anti Sensory (5th Digit)  32.5C  Wrist 2.9 2.8 0.1 24.8 23.0 7.3 Wrist 5th Digit 48 50 2   Motor Left/Right Comparison   Stim Site L Lat (ms) R Lat (ms) L-R Lat (ms) L Amp (mV) R Amp (mV) L-R Amp (%) Site1 Site2 L Vel (m/s) R Vel (m/s) L-R Vel (m/s)  Median Motor (Abd Poll Brev)  32.5C  Wrist 3.2 3.0 0.2 *4.9 6.5 24.6 Elbow Wrist 57 54 3  Elbow 6.7 6.4 0.3 5.6 6.5 13.8       Ulnar Motor (Abd Dig Min)  32.6C  Wrist 2.5 2.4 0.1 12.4 11.7 5.6 B Elbow Wrist 66 68 2  B Elbow 5.4 5.2 0.2 12.1 10.9 9.9 A Elbow B Elbow 83 91 8  A Elbow 6.6 6.3 0.3 11.7 11.1 5.1          Waveforms:                      Clinical History: 12/1/016 electrodiagnostic study of both upper limbs  Impression: Essentially NORMAL electrodiagnostic study of both upper limbs.  There is no significant electrodiagnostic evidence of nerve entrapment, brachial plexopathy or cervical radiculopathy.  Obviously, this test would not indicate musculotendinous overuse syndromes and tendinitis.  As you know, purely sensory or demyelinating radiculopathies and chemical radiculitis may not be detected with this particular electrodiagnostic study.  This study would also not rule out a small fiber polyneuropathy or dysesthesias from a central pain sensitization syndrome such as fibromyalgia.  Recommendations: 1.  Follow-up with referring physician. 2.  Continue current management of symptoms.  If felt to be carpal tunnel syndrome a diagnostic carpal tunnel injection may be indicated.     ___________________________ Einar Gip. Ernestina Patches, M.D. FAAPMR Board Certified, American Board of Physical  Medicine and Rehabilitation     Objective:  VS:  HT:     WT:    BMI:      BP:    HR: bpm   TEMP: ( )   RESP:  Physical Exam Musculoskeletal:        General: No swelling, tenderness or deformity.     Comments: Inspection reveals no atrophy of the bilateral APB or FDI or hand intrinsics. There is no swelling, color changes, allodynia or dystrophic changes. There is 5 out of 5 strength in the bilateral wrist extension, finger abduction and long finger flexion. There is intact sensation to light touch in all dermatomal and peripheral nerve distributions. T There is a negative Phalen's test bilaterally. There is a negative Hoffmann's test bilaterally.  Skin:    General: Skin is warm and dry.     Findings: No erythema or rash.  Neurological:     General: No focal deficit present.     Mental Status: She is alert and oriented to person, place, and time.     Motor: No weakness or abnormal muscle tone.     Coordination: Coordination normal.  Psychiatric:        Mood and Affect: Mood normal.        Behavior: Behavior normal.     Imaging: No results found.

## 2021-12-29 ENCOUNTER — Ambulatory Visit: Payer: BC Managed Care – PPO | Admitting: Orthopaedic Surgery

## 2022-01-03 ENCOUNTER — Other Ambulatory Visit: Payer: Self-pay

## 2022-01-03 ENCOUNTER — Ambulatory Visit (INDEPENDENT_AMBULATORY_CARE_PROVIDER_SITE_OTHER): Payer: BC Managed Care – PPO | Admitting: Orthopaedic Surgery

## 2022-01-03 ENCOUNTER — Encounter: Payer: Self-pay | Admitting: Orthopaedic Surgery

## 2022-01-03 DIAGNOSIS — R202 Paresthesia of skin: Secondary | ICD-10-CM | POA: Diagnosis not present

## 2022-01-03 NOTE — Progress Notes (Signed)
° °Office Visit Note °  °Patient: Evelyn [name]           °Date of Birth: 06/03/1986           °MRN: [id number] °Visit Date: 01/03/2022 °             °Requested by: Eksir, Samantha A, MD °[address] °[place],  [place] [address] °PCP: Eksir, Samantha A, MD ° ° °Assessment & Plan: °Visit Diagnoses:  °1. Paresthesia of both hands   ° ° °Plan: Evelyn [name] returns today to review nerve conduction studies.  She was recently started on methotrexate by Dr. Deborah shore.  She continues to wear braces at night. ° °Examination of the hands is unchanged. ° °Nerve conduction studies reviewed with the patient today.  The findings are unremarkable and negative for carpal tunnel syndrome.  I do not see any surgical pathology at this time.  I feel that her symptoms could be related to lupus, fibromyalgia, rheumatoid arthritis therefore I defer further management to Dr. Deborah sure ° °Follow-Up Instructions: No follow-ups on file.  ° °Orders:  °No orders of the defined types were placed in this encounter. ° °No orders of the defined types were placed in this encounter. ° ° ° ° Procedures: °No procedures performed ° ° °Clinical Data: °No additional findings. ° ° °Subjective: °Chief Complaint  °Patient presents with  ° Right Hand - Pain  ° Left Hand - Pain  ° ° °HPI ° °Review of Systems ° ° °Objective: °Vital Signs: There were no vitals taken for this visit. ° °Physical Exam ° °Ortho Exam ° °Specialty Comments:  °No specialty comments available. ° °Imaging: °No results found. ° ° °PMFS History: °Patient Active Problem List  ° Diagnosis Date Noted  ° Pain of left side of body 07/02/2019  ° Paresthesia 07/02/2019  ° Vulvovaginal candidiasis 06/23/2019  ° Gastroesophageal reflux disease 01/27/2019  ° Systemic lupus erythematosus (HCC) 11/19/2018  ° Raynaud's disease without gangrene 11/19/2018  ° High risk medication use 11/19/2018  ° History of bilateral carpal tunnel release 11/19/2018  ° Autoimmune disease (HCC) 10/08/2018  °  Carpal tunnel syndrome, right upper limb 09/26/2018  ° Radicular pain in right arm 09/16/2018  ° History of pregnancy induced hypertension 08/03/2014  ° History of pre-eclampsia 08/03/2014  ° Chronic back pain 01/14/2014  ° Allergic rhinitis 01/14/2014  ° °Past Medical History:  °Diagnosis Date  ° Depression   ° post partum  ° GERD (gastroesophageal reflux disease)   ° Systemic lupus erythematosus (HCC)   ° Umbilical hernia   °  °Family History  °Problem Relation Age of Onset  ° Healthy Mother   ° Other Father   °     never met her father - unsure of his history  ° Healthy Sister   ° Allergy (severe) Sister   °     sunblocks and mosquito repelent.  ° Healthy Brother   ° Healthy Daughter   ° Asthma Son   ° Healthy Son   ° Healthy Son   ° Healthy Son   ° Breast cancer Cousin 26  °     undergoing chemo/radiation  ° Heart disease Neg Hx   ° Neurologic Disorder Neg Hx   ° Colon cancer Neg Hx   ° Esophageal cancer Neg Hx   °  °Past Surgical History:  °Procedure Laterality Date  ° BREAST LUMPECTOMY WITH RADIOACTIVE SEED LOCALIZATION Right 03/09/2020  ° Procedure: RIGHT BREAST LUMPECTOMY WITH RADIOACTIVE SEED LOCALIZATION;  Surgeon:   Cornett, Thomas, MD;  Location: [facility];  Service: General;  Laterality: Right;  ° CARPAL TUNNEL RELEASE Right 09/26/2018  ° Procedure: RIGHT CARPAL TUNNEL RELEASE;  Surgeon: Xu, Naiping M, MD;  Location: [facility];  Service: Orthopedics;  Laterality: Right;  ° CARPAL TUNNEL RELEASE Left 2017  ° CESAREAN SECTION N/A 08/05/2014  ° Procedure: CESAREAN SECTION;  Surgeon: Dyanna Clark, MD;  Location: [facility];  Service: Obstetrics;  Laterality: N/A;  ° KNEE SURGERY Left 2000  ° LAPAROSCOPIC BILATERAL SALPINGECTOMY N/A 12/13/2015  ° Procedure: LAPAROSCOPIC BILATERAL SALPINGECTOMY;  Surgeon: Myra C Dove, MD;  Location: [facility];  Service: Gynecology;  Laterality: N/A;  ° PERINEOPLASTY N/A 02/20/2016  ° Procedure: PERINEOPLASTY;  Surgeon: Myra C Dove, MD;  Location: [facility];   Service: Gynecology;  Laterality: N/A;  ° PUBOVAGINAL SLING N/A 12/13/2015  ° Procedure: PUBO-VAGINAL SLING;  Surgeon: Myra C Dove, MD;  Location: [facility];  Service: Gynecology;  Laterality: N/A;  ° RECTOCELE REPAIR N/A 02/20/2016  ° Procedure: POSTERIOR REPAIR (RECTOCELE);  Surgeon: Myra C Dove, MD;  Location: [facility];  Service: Gynecology;  Laterality: N/A;  ° UMBILICAL HERNIA REPAIR  2011  ° °Social History  ° °Occupational History  ° Occupation: [employer] - part-time  °Tobacco Use  ° Smoking status: Never  ° Smokeless tobacco: Never  °Vaping Use  ° Vaping Use: Never used  °Substance and Sexual Activity  ° Alcohol use: Not Currently  ° Drug use: No  ° Sexual activity: Yes  °  Partners: Male  °  Birth control/protection: Surgical, I.U.D.  ° ° ° ° ° ° °

## 2022-01-12 ENCOUNTER — Ambulatory Visit: Payer: BC Managed Care – PPO | Admitting: Allergy

## 2022-02-15 NOTE — Progress Notes (Addendum)
?[facility] Note ? ?02/22/2022 ? ?  ? ?CHIEF COMPLAINT ?Patient presents for Retina Follow Up ? ? ?HISTORY OF PRESENT ILLNESS: ?Evelyn [name] is a 36 y.o. female who presents to the clinic today for:  ? ?HPI   ? ? Retina Follow Up   ?Patient presents with  Other.  In both eyes.  This started 1 year ago.  I, the attending physician,  performed the HPI with the patient and updated documentation appropriately. ? ?  ?  ? ? Comments   ?Patient here for 1 year retina follow up for lattice deg OU/Plaquenil exam. Patient states vision is ok. Feels like OD getting a shadow peripherally. No eye pain. Gets irritated. Added Modasinil $RemoveBeforeD'100mg'[id number]$  daily, methotrexate 2.5 mg 6 tablets once a week (50 mg total every 7 days) folic acid 1 mg a day. ? ?  ?  ?Last edited by Bernarda Caffey, MD on 02/22/2022  9:45 PM.  ?  ? ?Patient states  ? ?Referring physician: ?Nickola Major, MD ?[address] ?[place],  [place] [address] ? ?HISTORICAL INFORMATION:  ? ?Selected notes from the [facility] ?Referred by Dr. Zenia Resides for eval of lattice holes OU.  ? ?CURRENT MEDICATIONS: ?No current outpatient medications on file. (Ophthalmic Drugs)  ? ?No current facility-administered medications for this visit. (Ophthalmic Drugs)  ? ?Current Outpatient Medications (Other)  ?Medication Sig  ? Cholecalciferol (VITAMIN D) 50 MCG (2000 UT) CAPS Take by mouth daily.  ? famotidine (PEPCID) 20 MG tablet Take 1 tablet (20 mg total) by mouth 2 (two) times daily.  ? fexofenadine (ALLEGRA) 180 MG tablet Take 1 tablet (180 mg total) by mouth in the morning and at bedtime.  ? hydrocortisone 2.5 % cream APP EXT AA BID FOR FACIAL RASH  ? hydroxychloroquine (PLAQUENIL) 200 MG tablet Take 1 tablet by mouth twice daily Monday through Friday only.  ? lansoprazole (PREVACID) 30 MG capsule Take 30 mg by mouth daily at 12 noon.  ? LEVONORGESTREL IU 1 each by Intrauterine route once.   ? traMADol (ULTRAM) 50 MG tablet Take 1 tablet  (50 mg total) by mouth 3 (three) times daily as needed.  ? triamcinolone (KENALOG) 0.1 % paste Place onto teeth.  ? triamcinolone cream (KENALOG) 0.1 % Apply 1 application topically 2 (two) times daily.  ? valACYclovir (VALTREX) 500 MG tablet Take 1 tablet (500 mg total) by mouth daily.  ? venlafaxine XR (EFFEXOR-XR) 75 MG 24 hr capsule Take 3 capsules (225 mg total) by mouth daily with breakfast.  ? meloxicam (MOBIC) 7.5 MG tablet Take 1 tablet (7.5 mg total) by mouth daily as needed for up to 30 doses for pain.  ? pregabalin (LYRICA) 50 MG capsule Take 100 mg by mouth daily.   ? ?No current facility-administered medications for this visit. (Other)  ? ?REVIEW OF SYSTEMS: ?ROS   ?Positive for: Gastrointestinal, Musculoskeletal, Eyes ?Negative for: Constitutional, Neurological, Skin, Genitourinary, HENT, Endocrine, Cardiovascular, Respiratory, Psychiatric, Allergic/Imm, Heme/Lymph ?Last edited by Theodore Demark, COA on 02/22/2022  8:59 AM.  ?  ? ?ALLERGIES ?No Known Allergies ? ?PAST MEDICAL HISTORY ?Past Medical History:  ?Diagnosis Date  ? Depression   ? post partum  ? GERD (gastroesophageal reflux disease)   ? Systemic lupus erythematosus ([place])   ? Umbilical hernia   ? ?Past Surgical History:  ?Procedure Laterality Date  ? BREAST LUMPECTOMY WITH RADIOACTIVE SEED LOCALIZATION Right 03/09/2020  ? Procedure: RIGHT BREAST LUMPECTOMY WITH RADIOACTIVE  SEED LOCALIZATION;  Surgeon: Erroll Luna, MD;  Location: [facility];  Service: General;  Laterality: Right;  ? CARPAL TUNNEL RELEASE Right 09/26/2018  ? Procedure: RIGHT CARPAL TUNNEL RELEASE;  Surgeon: Leandrew Koyanagi, MD;  Location: [facility];  Service: Orthopedics;  Laterality: Right;  ? CARPAL TUNNEL RELEASE Left 2017  ? CESAREAN SECTION N/A 08/05/2014  ? Procedure: CESAREAN SECTION;  Surgeon: Jerelyn Charles, MD;  Location: [facility];  Service: Obstetrics;  Laterality: N/A;  ? KNEE SURGERY Left 2000  ? LAPAROSCOPIC BILATERAL SALPINGECTOMY N/A  12/13/2015  ? Procedure: LAPAROSCOPIC BILATERAL SALPINGECTOMY;  Surgeon: Emily Filbert, MD;  Location: [facility];  Service: Gynecology;  Laterality: N/A;  ? PERINEOPLASTY N/A 02/20/2016  ? Procedure: PERINEOPLASTY;  Surgeon: Emily Filbert, MD;  Location: [facility];  Service: Gynecology;  Laterality: N/A;  ? PUBOVAGINAL SLING N/A 12/13/2015  ? Procedure: Gaynelle Arabian;  Surgeon: Emily Filbert, MD;  Location: [facility];  Service: Gynecology;  Laterality: N/A;  ? RECTOCELE REPAIR N/A 02/20/2016  ? Procedure: POSTERIOR REPAIR (RECTOCELE);  Surgeon: Emily Filbert, MD;  Location: [facility];  Service: Gynecology;  Laterality: N/A;  ? UMBILICAL HERNIA REPAIR  2011  ? ? ?FAMILY HISTORY ?Family History  ?Problem Relation Age of Onset  ? Healthy Mother   ? Other Father   ?     never met her father - unsure of his history  ? Healthy Sister   ? Allergy (severe) Sister   ?     sunblocks and mosquito repelent.  ? Healthy Brother   ? Healthy Daughter   ? Asthma Son   ? Healthy Son   ? Healthy Son   ? Healthy Son   ? Breast cancer Cousin 26  ?     undergoing chemo/radiation  ? Heart disease Neg Hx   ? Neurologic Disorder Neg Hx   ? Colon cancer Neg Hx   ? Esophageal cancer Neg Hx   ? ?SOCIAL HISTORY ?Social History  ? ?Tobacco Use  ? Smoking status: Never  ? Smokeless tobacco: Never  ?Vaping Use  ? Vaping Use: Never used  ?Substance Use Topics  ? Alcohol use: Not Currently  ? Drug use: No  ?  ? ?  ?OPHTHALMIC EXAM: ? ?Base Eye Exam   ? ? Visual Acuity (Snellen - Linear)   ? ?   Right Left  ? Dist cc 20/20 -1 20/20  ? ? Correction: Glasses  ? ?  ?  ? ? Tonometry (Tonopen, 8:55 AM)   ? ?   Right Left  ? Pressure 16 15  ? ?  ?  ? ? Pupils   ? ?   Dark Light Shape React APD  ? Right 4 3 Round Brisk None  ? Left 4 3 Round Brisk None  ? ?  ?  ? ? Visual Fields (Counting fingers)   ? ?   Left Right  ?  Full Full  ? ?  ?  ? ? Extraocular Movement   ? ?   Right Left  ?  Full, Ortho Full, Ortho  ? ?  ?  ? ? Neuro/Psych   ? ? Oriented x3: Yes  ? Mood/Affect:  Normal  ? ?  ?  ? ? Dilation   ? ? Both eyes: 1.0% Mydriacyl, 2.5% Phenylephrine @ 8:54 AM  ? ?  ?  ? ?  ? ?Slit Lamp and Fundus Exam   ? ? Slit Lamp Exam   ? ?  Right Left  ? Lids/Lashes Mild Telangiectasia Mild Telangiectasia  ? Conjunctiva/Sclera White and quiet White and quiet  ? Cornea Clear Clear  ? Anterior Chamber Deep and quiet Deep and quiet  ? Iris Round and dilated Round and dilated  ? Lens Clear Clear  ? Anterior Vitreous Trace Vitreous syneresis, no cell Trace Vitreous syneresis, no cell  ? ?  ?  ? ? Fundus Exam   ? ?   Right Left  ? Disc Pink and Sharp Pink and Sharp  ? C/D Ratio 0.3 0.3  ? Macula Flat, Good foveal reflex, No heme or edema Flat, Good foveal reflex, No heme or edema  ? Vessels Normal Mild copper wiring  ? Periphery Attached, temporal WWP, flap tear at 0930 ora, small patch of lattice with atrophic holes at 1000--good laser changes surrounding, mild patch of lattice at 0600 very anterior micro tear at 0700 ora--light laser changes surrounding, patch of lattice at 1030-11 -- good laser in place around all lesions, No new RT/RD/lattice Attached, mild patch of pigmented lattice at 0430, peripheral patch of lattice extending from 0500-0730, focal pigmented patch at 0730 equator -- excellent laser changes in place, No new RT/RD  ? ?  ?  ? ?  ? ?Refraction   ? ? Wearing Rx   ? ?   Sphere Cylinder Axis  ? Right -4.00 +1.00 103  ? Left -3.75 +1.00 073  ? ? Type: SVL  ? ?  ?  ? ?  ? ? ?IMAGING AND PROCEDURES  ?OCT, Retina - OU - Both Eyes   ? ?   ?Right Eye ?Quality was good. Central Foveal Thickness: 285. Progression has been stable. Findings include normal foveal contour, no IRF, no SRF, vitreomacular adhesion (No ellipsoid loss).  ? ?Left Eye ?Quality was good. Central Foveal Thickness: 283. Progression has been stable. Findings include normal foveal contour, no IRF, no SRF, vitreomacular adhesion .  ? ?Notes ?*Images captured and stored on drive ? ?Diagnosis / Impression:  ?NFP, no IRF/SRF  OU--stable ?VMA OU ?No ellipsoid loss or plaquenil toxicity OU ? ?Clinical management:  ?See below ? ?Abbreviations: NFP - Normal foveal profile. CME - cystoid macular edema. PED - pigment epithelial de

## 2022-02-22 ENCOUNTER — Encounter (INDEPENDENT_AMBULATORY_CARE_PROVIDER_SITE_OTHER): Payer: Self-pay | Admitting: Ophthalmology

## 2022-02-22 ENCOUNTER — Ambulatory Visit (INDEPENDENT_AMBULATORY_CARE_PROVIDER_SITE_OTHER): Payer: BC Managed Care – PPO | Admitting: Ophthalmology

## 2022-02-22 ENCOUNTER — Other Ambulatory Visit: Payer: Self-pay

## 2022-02-22 DIAGNOSIS — H33303 Unspecified retinal break, bilateral: Secondary | ICD-10-CM | POA: Diagnosis not present

## 2022-02-22 DIAGNOSIS — Z79899 Other long term (current) drug therapy: Secondary | ICD-10-CM

## 2022-02-22 DIAGNOSIS — M3219 Other organ or system involvement in systemic lupus erythematosus: Secondary | ICD-10-CM

## 2022-02-22 DIAGNOSIS — H35413 Lattice degeneration of retina, bilateral: Secondary | ICD-10-CM | POA: Diagnosis not present

## 2022-02-23 ENCOUNTER — Ambulatory Visit (INDEPENDENT_AMBULATORY_CARE_PROVIDER_SITE_OTHER): Payer: BC Managed Care – PPO | Admitting: Allergy

## 2022-02-23 ENCOUNTER — Encounter: Payer: Self-pay | Admitting: Allergy

## 2022-02-23 VITALS — BP 112/70 | HR 80 | Resp 16

## 2022-02-23 DIAGNOSIS — L2089 Other atopic dermatitis: Secondary | ICD-10-CM

## 2022-02-23 DIAGNOSIS — L508 Other urticaria: Secondary | ICD-10-CM | POA: Diagnosis not present

## 2022-02-23 DIAGNOSIS — H109 Unspecified conjunctivitis: Secondary | ICD-10-CM

## 2022-02-23 DIAGNOSIS — J4520 Mild intermittent asthma, uncomplicated: Secondary | ICD-10-CM | POA: Diagnosis not present

## 2022-02-23 DIAGNOSIS — T781XXD Other adverse food reactions, not elsewhere classified, subsequent encounter: Secondary | ICD-10-CM

## 2022-02-23 DIAGNOSIS — J310 Chronic rhinitis: Secondary | ICD-10-CM

## 2022-02-23 DIAGNOSIS — H1013 Acute atopic conjunctivitis, bilateral: Secondary | ICD-10-CM

## 2022-02-23 MED ORDER — ALBUTEROL SULFATE HFA 108 (90 BASE) MCG/ACT IN AERS: INHALATION_SPRAY | RESPIRATORY_TRACT | 1 refills | Status: DC

## 2022-02-23 NOTE — Progress Notes (Signed)
? ? ?Follow-up Note ? ?RE: Evelyn [name] MRN: [id number] DOB: 17-Apr-1986 ?Date of Office Visit: 02/23/2022 ? ? ?History of present illness: ?Evelyn [name] is a 36 y.o. female presenting today for follow-up of urticaria/dermatitis, adverse food reaction and rhinoconjunctivitis.  She was last seen in the office on 11/10/2021 by myself.  Since this visit she has had some changes to her medications if she is on modafinil as well as methotrexate at this time.  She also is unable to see dermatologist, Dr. Ronalee Red, on 12/29/21 for initial visit in [place].  He recommended for her to have a biopsy of the lesion when she is having a flare.  However patient states it is difficult to get out to [facility] with this type of recommendation and would prefer to have a local allergist here in [place] that she can see and whenever possible have this biopsy performed.  She was prescribed hydrocortisone and triamcinolone for her to use topically primarily on the face.  The face is where she has been having more of a blistering type rash.  Her body is where she is having more regarding urticarial type rash.  She states since her last visit and being on the Allegra and Pepcid the body rash that she was having has been pretty quiet since around January.  She is still having issues with the blistering type rash on the face.  She has not noted any differences with using the hydrocortisone in the morning triamcinolone during the daytime. ?She does note early on was using the Pepcid as she thought it was causing some sleep issues but she was also taking Lyrica at night at that time.  She did stop the Lyrica.  She has resumed Pepcid and she is not noticing any sleep issues with this now. ?She states her PCP also has her using Valtrex but she is not sure if this is making any difference with either with the facial rash. ?She does continue to avoid dairy and mushroom products. ?She still reports significant itchy and watery  eyes, nasal congestion and drainage and sneeze. ?She also states that she feels that she can get a little short of breath when the allergy symptoms are a lot worse like with the high pollen count days.  She does have children that have asthma and uses inhalers.  She herself has never used an inhaler however. ? ?Review of systems: ?Review of Systems  ?Constitutional: Negative.   ?HENT:    ?     See HPI  ?Eyes:   ?     See HPI  ?Respiratory:    ?     See HPI  ?Cardiovascular: Negative.   ?Gastrointestinal: Negative.   ?Musculoskeletal: Negative.   ?Skin:   ?     See HPI  ?Allergic/Immunologic: Negative.   ?Neurological: Negative.    ? ?All other systems negative unless noted above in HPI ? ?Past medical/social/surgical/family history have been reviewed and are unchanged unless specifically indicated below. ? ?No changes ? ?Medication List: ?Current Outpatient Medications  ?Medication Sig Dispense Refill  ? albuterol (VENTOLIN HFA) 108 (90 Base) MCG/ACT inhaler Inhale two puffs every 4-6 hours if needed for cough or wheeze. 1 each 1  ? Cholecalciferol (VITAMIN D) 50 MCG (2000 UT) CAPS Take by mouth daily.    ? famotidine (PEPCID) 20 MG tablet Take 1 tablet (20 mg total) by mouth 2 (two) times daily. 60 tablet 5  ? fexofenadine (ALLEGRA) 180 MG tablet Take 1 tablet (180 mg  total) by mouth in the morning and at bedtime. 60 tablet 5  ? folic acid (FOLVITE) 1 MG tablet Take 1 tablet by mouth daily.    ? hydrocortisone 2.5 % cream APP EXT AA BID FOR FACIAL RASH    ? hydroxychloroquine (PLAQUENIL) 200 MG tablet Take 1 tablet by mouth twice daily Monday through Friday only. 120 tablet 0  ? lansoprazole (PREVACID) 30 MG capsule Take 30 mg by mouth daily at 12 noon.    ? LEVONORGESTREL IU 1 each by Intrauterine route once.     ? methotrexate (RHEUMATREX) 2.5 MG tablet Take 15 mg by mouth once a week.    ? modafinil (PROVIGIL) 100 MG tablet Take 100 mg by mouth daily.    ? traMADol (ULTRAM) 50 MG tablet Take 1 tablet (50 mg  total) by mouth 3 (three) times daily as needed. 30 tablet 2  ? triamcinolone (KENALOG) 0.1 % paste Place onto teeth.    ? triamcinolone cream (KENALOG) 0.1 % Apply 1 application topically 2 (two) times daily.    ? valACYclovir (VALTREX) 500 MG tablet Take 1 tablet (500 mg total) by mouth daily. 30 tablet 12  ? venlafaxine XR (EFFEXOR-XR) 75 MG 24 hr capsule Take 3 capsules (225 mg total) by mouth daily with breakfast. 90 capsule 12  ? ?No current facility-administered medications for this visit.  ?  ? ?Known medication allergies: ?No Known Allergies ? ? ?Physical examination: ?Blood pressure 112/70, pulse 80, resp. rate 16, SpO2 100 %. ? ?General: Alert, interactive, in no acute distress. ?HEENT: PERRLA, TMs pearly gray, turbinates mildly edematous without discharge, post-pharynx non erythematous. ?Neck: Supple without lymphadenopathy. ?Lungs: Clear to auscultation without wheezing, rhonchi or rales. {no increased work of breathing. ?CV: Normal S1, S2 without murmurs. ?Abdomen: Nondistended, nontender. ?Skin: Erythematous papule over the right lower chin . ?Extremities:  No clubbing, cyanosis or edema. ?Neuro:   Grossly intact. ? ?Diagnositics/Labs: ?Labs:  ?Component ?    Latest Ref Rng 11/10/2021  ?D Pteronyssinus IgE ?    Class 0 kU/L <0.10   ?D Farinae IgE ?    Class 0 kU/L <0.10   ?Cat Dander IgE ?    Class 0 kU/L <0.10   ?Dog Dander IgE ?    Class 0 kU/L <0.10   ?[place] Grass IgE ?    Class 0 kU/L <0.10   ?Timothy Grass IgE ?    Class 0 kU/L <0.10   ?Johnson Grass IgE ?    Class 0 kU/L <0.10   ?Cockroach, [place] IgE ?    Class 0 kU/L <0.10   ?Penicillium Chrysogen IgE ?    Class 0 kU/L <0.10   ?Cladosporium Herbarum IgE ?    Class 0 kU/L <0.10   ?Aspergillus Fumigatus IgE ?    Class 0 kU/L <0.10   ?Alternaria Alternata IgE ?    Class 0 kU/L <0.10   ?Maple/Box Elder IgE ?    Class 0 kU/L <0.10   ?Common Silver Wendee Copp IgE ?    Class 0 kU/L <0.10   ?Odon IgE ?    Class 0 kU/L <0.10   ?Oak, [place] IgE ?     Class 0 kU/L <0.10   ?Elm, American IgE ?    Class 0 kU/L <0.10   ?Cottonwood IgE ?    Class 0 kU/L <0.10   ?Pecan, Hickory IgE ?    Class 0 kU/L <0.10   ?White Mulberry IgE ?    Class 0 kU/L <0.10   ?  Ragweed, Short IgE ?    Class 0 kU/L <0.10   ?Pigweed, Rough IgE ?    Class 0 kU/L <0.10   ?Sheep Sorrel IgE Qn ?    Class 0 kU/L <0.10   ?Mouse Urine IgE ?    Class 0 kU/L <0.10   ?Class Description Allergens Comment   ?IgE (Immunoglobulin E), Serum ?    6 - 495 IU/mL 36   ?O215-IgE Alpha-Gal ?    Class 0 kU/L <0.10   ?Beef IgE ?    Class 0 kU/L <0.10   ?Pork IgE ?    Class 0 kU/L <0.10   ?Allergen Lamb IgE ?    Class 0 kU/L <0.10   ?Thyroperoxidase Ab SerPl-aCnc ?    0 - 34 IU/mL <9   ?Thyroglobulin Antibody ?    0.0 - 0.9 IU/mL <1.0   ?cu index ?    <10  4.0   ?Tryptase ?    2.2 - 13.2 ug/L 2.5   ?Sed Rate ?    0 - 32 mm/hr 15   ?Milk IgE ?    Class 0 kU/L <0.10   ?Mushroom IgE ?    Class 0 kU/L <0.10   ? ? ? ?Assessment and plan: ?Dermatitis ?-description and appearance of the rash appears to be a variant of hives like cholinergic hives. Hives are intermittent and can be very itchy.  Physical features can worsen hives like pressure and change in body temperature.   ?Your labs were reassuring.   ?-for management of hives continue Allegra '180mg'$  1 tab twice a day with Pepcid '20mg'$  1 tab twice a day (you can take Pepcid and Prevacid together as they work differently for reflux control).    ? ?- for blistering rash on face try Opzelura, non-steroid ointment, twice a day as needed.  Hold your steroids to see if this non-steroid ointment is effective.  Samples provided. ? ?-We will place referral for a local dermatologist ? ?Adverse food reaction ?-continue avoidance of dairy and mushrooms ?-IgE levels are negative thus if you would like to eat these foods can perform in-office food challenges.  ? ?Environmental allergies ?-environmental allergy panel was negative.  Once skin is under better control consider allergy  testing by skin prick testing ?-continue Allegra as above ?-for itchy/watery eyes can use Pataday 1 drop each eye daily as needed ?-for nasal congestion/ear symptoms can use a nasal steroid spray like Nasac

## 2022-02-23 NOTE — Patient Instructions (Addendum)
Rash ?-description and appearance of the rash appears to be a variant of hives like cholinergic hives. Hives are intermittent and can be very itchy.  Physical features can worsen hives like pressure and change in body temperature.   ?Your labs were reassuring.   ?-for management of hives continue Allegra '180mg'$  1 tab twice a day with Pepcid '20mg'$  1 tab twice a day (you can take Pepcid and Prevacid together as they work differently for reflux control).    ? ?- for blistering rash on face try Opzelura, non-steroid ointment, twice a day as needed.  Hold your steroids to see if this non-steroid ointment is effective ? ?Adverse food reaction ?-continue avoidance of dairy and mushrooms ?-IgE levels are negative thus if you would like to eat these foods can perform in-office food challenges.  ? ?Environmental allergies ?-environmental allergy panel was negative.  Once skin is under better control consider allergy testing by skin prick testing ?-continue Allegra as above ?-for itchy/watery eyes can use Pataday 1 drop each eye daily as needed ?-for nasal congestion/ear symptoms can use a nasal steroid spray like Nasacort, Rhinocort or Flonase 2 sprays each nostril daily for 1-2 weeks at a time before stopping once nasal congestion improves for maximum benefit ? ?Follow-up in 4-6 months or sooner if needed ? ? ?

## 2022-02-28 ENCOUNTER — Encounter (INDEPENDENT_AMBULATORY_CARE_PROVIDER_SITE_OTHER): Payer: BC Managed Care – PPO | Admitting: Ophthalmology

## 2022-03-05 ENCOUNTER — Telehealth: Payer: Self-pay

## 2022-03-05 NOTE — Telephone Encounter (Signed)
Patient called back and see is going to call the  [facility] location since she has established care with [facility].l  ? ? ?

## 2022-03-05 NOTE — Telephone Encounter (Signed)
-----   Message from Coffey, MD sent at 02/23/2022  4:47 PM EDT ----- ?Patient was seen dermatologist at [facility] in [place] location and this is not convenient for her.  She would like to go to a Loss adjuster, chartered in [place].  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

## 2022-03-05 NOTE — Telephone Encounter (Signed)
Patient has [employer]BS (Primary) and MCD as secondary. I am limited on places to refer in [place] due to many dermatologist not taking MCD. The best I could do is [facility]- [place] ([place]) or [place].  ? ?I called and left a message for the patient to give me a call back./  ?

## 2022-03-29 ENCOUNTER — Ambulatory Visit: Payer: BC Managed Care – PPO | Admitting: Allergy

## 2022-03-29 ENCOUNTER — Other Ambulatory Visit (HOSPITAL_COMMUNITY)
Admission: RE | Admit: 2022-03-29 | Discharge: 2022-03-29 | Disposition: A | Discharge status: Home / Self Care | Payer: BC Managed Care – PPO | Source: Ambulatory Visit | Attending: Obstetrics & Gynecology | Admitting: Obstetrics & Gynecology

## 2022-03-29 ENCOUNTER — Encounter: Payer: Self-pay | Admitting: Obstetrics & Gynecology

## 2022-03-29 ENCOUNTER — Ambulatory Visit (INDEPENDENT_AMBULATORY_CARE_PROVIDER_SITE_OTHER): Payer: BC Managed Care – PPO | Admitting: Obstetrics & Gynecology

## 2022-03-29 VITALS — BP 118/78 | HR 94 | Wt 160.0 lb

## 2022-03-29 DIAGNOSIS — Z01419 Encounter for gynecological examination (general) (routine) without abnormal findings: Secondary | ICD-10-CM | POA: Insufficient documentation

## 2022-03-29 DIAGNOSIS — Z30433 Encounter for removal and reinsertion of intrauterine contraceptive device: Secondary | ICD-10-CM

## 2022-03-29 DIAGNOSIS — R61 Generalized hyperhidrosis: Secondary | ICD-10-CM

## 2022-03-29 MED ORDER — VENLAFAXINE HCL ER 75 MG PO CP24: 225.0000 mg | ORAL_CAPSULE | Freq: Every day | ORAL | 12 refills | Status: AC

## 2022-03-29 MED ORDER — LEVONORGESTREL 20.1 MCG/DAY IU IUD
1.0000 | INTRAUTERINE_SYSTEM | Freq: Once | INTRAUTERINE | Status: AC
  Administered 2022-03-29: 1 via INTRAUTERINE

## 2022-03-29 MED ORDER — GABAPENTIN 600 MG PO TABS
600.0000 mg | ORAL_TABLET | Freq: Every day | ORAL | 3 refills | Status: DC
Start: 2022-03-29 — End: 2022-07-27

## 2022-03-29 NOTE — Progress Notes (Signed)
? ? ?GYNECOLOGY ANNUAL PREVENTATIVE CARE ENCOUNTER NOTE ? ?History:    ? Evelyn [name] is a 36 y.o. [phone] female here for a routine annual gynecologic exam.  Current complaints: continued increased sweating during the day and at night.  Does not feel Effexor is helping adequately, currently on 225 mg daily which she takes at night.  Also wants Liletta IUD replaced, has been in place since 2017 and is being used for bleeding control as she already had a tubal sterilization.   Denies abnormal vaginal bleeding, discharge, pelvic pain, problems with intercourse or other gynecologic concerns.  ?  ?Gynecologic History ?No LMP recorded. (Menstrual status: IUD). ?Contraception: tubal ligation ?Last Pap: 10/20/2018. Result was normal with negative HPV ? ?Obstetric History ?OB History  ?Gravida Para Term Preterm AB Living  ?_0 0 4  ?SAB IAB Ectopic Multiple Live Births  ?0 0 0 0 4  ?  ?# Outcome Date GA Lbr Len/2nd Weight Sex Delivery Anes PTL Lv  ?4 Preterm 08/05/14 [redacted]w[redacted]d 1 lb 14.3 oz (0.86 kg) F CS-LTranv Spinal  LIV  ?3 Term 12/15/09 363w0d7 lb 6 oz (3.345 kg)  Vag-Spont   LIV  ?2 Term 03/01/08 3988w0d lb 1 oz (3.204 kg)  Vag-Spont   LIV  ?1 Preterm 12/13/02 36w33w0dlb 1 oz (2.75 kg)  Vag-Spont   LIV  ?   Birth Comments: IOL for Pre-eclampsia  ? ? ?Past Medical History:  ?Diagnosis Date  ? Depression   ? post partum  ? GERD (gastroesophageal reflux disease)   ? Systemic lupus erythematosus (HCC)[place]? Umbilical hernia   ? ? ?Past Surgical History:  ?Procedure Laterality Date  ? BREAST LUMPECTOMY WITH RADIOACTIVE SEED LOCALIZATION Right 03/09/2020  ? Procedure: RIGHT BREAST LUMPECTOMY WITH RADIOACTIVE SEED LOCALIZATION;  Surgeon: CornErroll Luna;  [facility];  Laterality: Right;  ? CARPAL TUNNEL RELEASE Right 09/26/2018  ? Procedure: RIGHT CARPAL TUNNEL RELEASE;  Surgeon: Xu, Leandrew Koyanagi;  [facility];   Laterality: Right;  ? CARPAL TUNNEL RELEASE Left 2017  ? CESAREAN SECTION N/A 08/05/2014  ? Procedure: CESAREAN SECTION;  Surgeon: DyanJerelyn Charles;  [facility];  Service: Obstetrics;  Laterality: N/A;  ? KNEE SURGERY Left 2000  ? LAPAROSCOPIC BILATERAL SALPINGECTOMY N/A 12/13/2015  ? Procedure: LAPAROSCOPIC BILATERAL SALPINGECTOMY;  Surgeon: MyraEmily Filbert;  [place] [facility] [facility];  Service: Gynecology;  Laterality: N/A;  ? PERINEOPLASTY N/A 02/20/2016  ? Procedure: PERINEOPLASTY;  Surgeon: MyraEmily Filbert;  [facility];  Service: Gynecology;  Laterality: N/A;  ? PUBOVAGINAL SLING N/A 12/13/2015  ? Procedure: PUBOGaynelle Arabianurgeon: MyraEmily Filbert;  [place] [facility];  Service: Gynecology;  Laterality: N/A;  ? RECTOCELE REPAIR N/A 02/20/2016  ? Procedure: POSTERIOR REPAIR (RECTOCELE);  Surgeon: MyraEmily Filbert;  [facility];  Service: Gynecology;  Laterality: N/A;  ? UMBILICAL HERNIA REPAIR  2011  ? ? ?Current Outpatient Medications on File Prior to Visit  ?Medication Sig Dispense Refill  ? albuterol (VENTOLIN HFA) 108 (90 Base) MCG/ACT inhaler Inhale two puffs every 4-6 hours if needed for cough or wheeze. 1 each 1  ? Cholecalciferol (VITAMIN D) 50 MCG (2000 UT) CAPS Take by mouth daily.    ? famotidine (PEPCID) 20 MG tablet Take 1 tablet (20 mg total) by mouth 2 (two) times daily. 60 tablet 5  ?  fexofenadine (ALLEGRA) 180 MG tablet Take 1 tablet (180 mg total) by mouth in the morning and at bedtime. 60 tablet 5  ? folic acid (FOLVITE) 1 MG tablet Take 1 tablet by mouth daily.    ? hydrocortisone 2.5 % cream APP EXT AA BID FOR FACIAL RASH    ? hydroxychloroquine (PLAQUENIL) 200 MG tablet Take 1 tablet by mouth twice daily Monday through Friday only. 120 tablet 0  ? lansoprazole (PREVACID) 30 MG capsule Take 30 mg by mouth daily at 12 noon.    ? methotrexate (RHEUMATREX) 2.5 MG tablet Take 15 mg by mouth once a week.    ? modafinil (PROVIGIL) 100 MG tablet Take 100 mg by mouth daily.    ? traMADol  (ULTRAM) 50 MG tablet Take 1 tablet (50 mg total) by mouth 3 (three) times daily as needed. 30 tablet 2  ? triamcinolone (KENALOG) 0.1 % paste Place onto teeth.    ? triamcinolone cream (KENALOG) 0.1 % Apply 1 application topically 2 (two) times daily.    ? valACYclovir (VALTREX) 500 MG tablet Take 1 tablet (500 mg total) by mouth daily. 30 tablet 12  ? ?No current facility-administered medications on file prior to visit.  ? ? ?No Known Allergies ? ?Social History:  reports that she has never smoked. She has never used smokeless tobacco. She reports that she does not currently use alcohol. She reports that she does not use drugs. ? ?Family History  ?Problem Relation Age of Onset  ? Healthy Mother   ? Other Father   ?     never met her father - unsure of his history  ? Healthy Sister   ? Allergy (severe) Sister   ?     sunblocks and mosquito repelent.  ? Healthy Brother   ? Healthy Daughter   ? Asthma Son   ? Healthy Son   ? Healthy Son   ? Healthy Son   ? Breast cancer Cousin 26  ?     undergoing chemo/radiation  ? Heart disease Neg Hx   ? Neurologic Disorder Neg Hx   ? Colon cancer Neg Hx   ? Esophageal cancer Neg Hx   ? ? ?The following portions of the patient's history were reviewed and updated as appropriate: allergies, current medications, past family history, past medical history, past social history, past surgical history and problem list. ? ?Review of Systems ?Pertinent items noted in HPI and remainder of comprehensive ROS otherwise negative. ? ?Physical Exam:  ?BP 118/78   Pulse 94   Wt 160 lb (72.6 kg)   BMI 30.23 kg/m?  ?CONSTITUTIONAL: Well-developed, well-nourished female in no acute distress.  ?HENT:  Normocephalic, atraumatic, External right and left ear normal.  ?EYES: Conjunctivae and EOM are normal. Pupils are equal, round, and reactive to light. No scleral icterus.  ?NECK: Normal range of motion, supple, no masses.  Normal thyroid.  ?SKIN: Skin is warm and dry. No rash noted. Not diaphoretic.  No erythema. No pallor. ?MUSCULOSKELETAL: Normal range of motion. No tenderness.  No cyanosis, clubbing, or edema. ?NEUROLOGIC: Alert and oriented to person, place, and time. Normal reflexes, muscle tone coordination.  ?PSYCHIATRIC: Normal mood and affect. Normal behavior. Normal judgment and thought content. ?CARDIOVASCULAR: Normal heart rate noted, regular rhythm ?RESPIRATORY: Clear to auscultation bilaterally. Effort and breath sounds normal, no problems with respiration noted. ?BREASTS: Symmetric in size. No masses, tenderness, skin changes, nipple drainage, or lymphadenopathy bilaterally. Performed in the presence of a chaperone. ?ABDOMEN: Soft, no distention  noted.  No tenderness, rebound or guarding.  ?PELVIC: Normal appearing external genitalia and urethral meatus; normal appearing vaginal mucosa and cervix.  No abnormal vaginal discharge noted.  Pap smear obtained.  Normal uterine size, no other palpable masses, no uterine or adnexal tenderness.  Performed in the presence of a chaperone. ? ?Liletta IUD Removal and Reinsertion  ?Patient identified, informed consent performed, consent signed.   Discussed risks of irregular bleeding, cramping, infection, malpositioning or misplacement of the IUD outside the uterus which may require further procedures. Also discussed >99% contraception efficacy, increased risk of ectopic pregnancy with failure of method.   Emphasized that this did not protect against STIs, condoms recommended during all sexual encounters. Time out was performed. During the pelvic exam in the presence of a chaperone, after the pap smear, the strings of the old [place] IUD were grasped and pulled using ring forceps. The IUD was successfully removed in its entirety. The cervix was cleaned with Betadine x 2 and grasped anteriorly with a single tooth tenaculum.  The new Liletta IUD insertion apparatus was used to sound the uterus to 8 cm;  the IUD was then placed per manufacturer's recommendations.  Strings trimmed to 3 cm. Tenaculum was removed, good hemostasis noted. Patient tolerated procedure well.  ?  ?Assessment and Plan:  ?   ?1. Night sweats  ?Effexor refilled for night sweats and sweating during the day, al

## 2022-04-03 LAB — CYTOLOGY - PAP
Comment: NEGATIVE
Diagnosis: NEGATIVE
Diagnosis: REACTIVE
High risk HPV: NEGATIVE

## 2022-04-09 ENCOUNTER — Ambulatory Visit: Payer: BC Managed Care – PPO | Admitting: Family Medicine

## 2022-04-16 ENCOUNTER — Telehealth: Payer: Self-pay | Admitting: Family

## 2022-04-16 NOTE — Telephone Encounter (Signed)
Patient came by the [facility] office at your request to show someone the next time she has a flare up. Small slightly erythematous papular lesions noted on left ankle/left lower leg region.  She also reports areas on her arms,but they are difficult to see due to her tattoos.  She also reports that they were on her face, but they are now gone.  She was given prednisone  by her primary care physician. She is not able to get in with dermatology when she has a flare up for a biopsy.  She has pictures on her phone. ? ?Ronni Rumble, FNP ?

## 2022-04-18 NOTE — Telephone Encounter (Signed)
An[employer]

## 2022-04-24 ENCOUNTER — Ambulatory Visit: Payer: BC Managed Care – PPO | Admitting: Obstetrics and Gynecology

## 2022-06-01 ENCOUNTER — Other Ambulatory Visit: Payer: Self-pay | Admitting: Internal Medicine

## 2022-06-01 ENCOUNTER — Other Ambulatory Visit: Payer: Self-pay | Admitting: *Deleted

## 2022-06-01 ENCOUNTER — Other Ambulatory Visit (HOSPITAL_COMMUNITY): Payer: Self-pay | Admitting: Internal Medicine

## 2022-06-01 DIAGNOSIS — K3184 Gastroparesis: Secondary | ICD-10-CM

## 2022-06-24 ENCOUNTER — Other Ambulatory Visit: Payer: Self-pay | Admitting: Allergy

## 2022-07-10 ENCOUNTER — Encounter (HOSPITAL_COMMUNITY)
Admission: RE | Admit: 2022-07-10 | Discharge: 2022-07-10 | Disposition: A | Discharge status: Home / Self Care | Payer: BC Managed Care – PPO | Source: Ambulatory Visit | Attending: Internal Medicine | Admitting: Internal Medicine

## 2022-07-10 DIAGNOSIS — K3184 Gastroparesis: Secondary | ICD-10-CM | POA: Diagnosis present

## 2022-07-10 MED ORDER — TECHNETIUM TC 99M SULFUR COLLOID
2.0000 | Freq: Once | INTRAVENOUS | Status: AC | PRN
  Administered 2022-07-10: 2 via ORAL

## 2022-07-27 ENCOUNTER — Ambulatory Visit (INDEPENDENT_AMBULATORY_CARE_PROVIDER_SITE_OTHER): Payer: BC Managed Care – PPO | Admitting: Allergy

## 2022-07-27 ENCOUNTER — Other Ambulatory Visit: Payer: Self-pay

## 2022-07-27 ENCOUNTER — Encounter: Payer: Self-pay | Admitting: Allergy

## 2022-07-27 VITALS — BP 122/80 | HR 71 | Temp 97.7°F | Resp 18 | Ht 61.0 in | Wt 157.8 lb

## 2022-07-27 DIAGNOSIS — H109 Unspecified conjunctivitis: Secondary | ICD-10-CM

## 2022-07-27 DIAGNOSIS — J3089 Other allergic rhinitis: Secondary | ICD-10-CM | POA: Diagnosis not present

## 2022-07-27 DIAGNOSIS — T781XXD Other adverse food reactions, not elsewhere classified, subsequent encounter: Secondary | ICD-10-CM

## 2022-07-27 DIAGNOSIS — L508 Other urticaria: Secondary | ICD-10-CM | POA: Diagnosis not present

## 2022-07-27 DIAGNOSIS — L2089 Other atopic dermatitis: Secondary | ICD-10-CM

## 2022-07-27 DIAGNOSIS — J4520 Mild intermittent asthma, uncomplicated: Secondary | ICD-10-CM

## 2022-07-27 DIAGNOSIS — H1013 Acute atopic conjunctivitis, bilateral: Secondary | ICD-10-CM

## 2022-07-27 MED ORDER — FAMOTIDINE 20 MG PO TABS: 20.0000 mg | ORAL_TABLET | Freq: Two times a day (BID) | ORAL | 5 refills | Status: AC

## 2022-07-27 MED ORDER — FEXOFENADINE HCL 180 MG PO TABS
180.0000 mg | ORAL_TABLET | Freq: Two times a day (BID) | ORAL | 5 refills | Status: DC
Start: 2022-07-27 — End: 2022-12-13

## 2022-07-27 MED ORDER — OLOPATADINE HCL 0.2 % OP SOLN: 1.0000 [drp] | Freq: Every day | OPHTHALMIC | 5 refills | Status: AC | PRN

## 2022-07-27 MED ORDER — RYALTRIS 665-25 MCG/ACT NA SUSP: NASAL | 5 refills | Status: DC

## 2022-07-27 NOTE — Patient Instructions (Addendum)
Dermatitis  -description and appearance of the rash appears to be a variant of hives like cholinergic hives. Hives are intermittent and can be very itchy.  Physical features can worsen hives like pressure and change in body temperature.   labs were reassuring.   -for management of hives continue Allegra '180mg'$  1 tab twice a day with Pepcid '20mg'$  1 tab twice a day (you can take Pepcid and Prevacid together as they work differently for reflux control).     - for blistering rash on face continue use of Tacrolimus as directed by your dermatologist.  Tacrolimus is a non-steroid ointment that can be used twice a day as needed.    - discussed Dupixent as add on therapy for improved skin control.  Benefits, risk and protocol discussed.  Information brochure provided.  Will have Tammy, our nurse coordinator, call you about starting.   Adverse food reaction  -continue avoidance of dairy and mushrooms -IgE levels are negative thus if you would like to eat these foods can perform in-office food challenges.   Environmental allergies  -environmental allergy panel was negative.  Once skin is under better control consider allergy testing by skin prick testing -continue Allegra as above -for itchy/watery eyes can use Pataday 1 drop each eye daily as needed -for nasal congestion/drainage try Ryaltris nasal spray.  This is a combo spray with a steroid (mometasone) for congestion control and antihistamine (olopatadine) for drainage control.  Use 2 sprays each nostril twice a day as needed.  Sample provided; let us know if effective and can send in a prescription  Reactive airway  -symptoms relieved with albuterol use -have access to albuterol inhaler 2 puffs every 4-6 hours as needed for cough/wheeze/shortness of breath/chest tightness.  May use 15-20 minutes prior to activity.   Monitor frequency of use.    Breathing control goals:  Full participation in all desired activities (may need albuterol before  activity) Albuterol use two time or less a week on average (not counting use with activity) Cough interfering with sleep two time or less a month Oral steroids no more than once a year No hospitalizations    Follow-up in 4-6 months or sooner if needed

## 2022-07-27 NOTE — Progress Notes (Signed)
Follow-up Note   RE: Evelyn [name] MRN: [id number] DOB: 12-29-1985 Date of Office Visit: 07/27/2022   History of present illness: Evelyn [name] is a 36 y.o. female presenting today for follow-up of dermatitis, adverse food reaction, allergic rhinitis with conjunctivitis and reactive airway.  She was last seen in the office on 02/23/2022 by myself. She did get into a different dermatologist since the last visit and has had an appointment in the past month.  She states she was prescribed a months course of minocycline.  She has been off of this for about 2 weeks.  Does sting like it was helpful as she has not had any increase in symptoms over the past 2 weeks at least.  He also prescribed tacrolimus which she states has been more helpful.  She did try the Opzelura we provided her at last visit but states it did not help at all. She still has issues with hives that are still itchy and tiny bumps in nature.  She is still taking Allegra and Pepcid twice a day.  At this time she would like to proceed with something else in regards to better management of her skin symptoms. She continues to avoid dairy and mushrooms. She has been noting more runny nose lately.  The Allegra does not seem to be helpful in that aspect.  For itchy watery eyes she will use eyedrop Pataday.  If the nasal steroid sprays are not helpful with the runny nose. She also states she has been having the shortness of breath still but the albuterol does help.  She has used albuterol on average about 1-2 times a week for symptom relief.  Review of systems: Review of Systems  Constitutional: Negative.   HENT:  Positive for postnasal drip.   Eyes: Negative.   Respiratory:  Positive for shortness of breath.   Cardiovascular: Negative.   Gastrointestinal: Negative.   Musculoskeletal: Negative.   Skin:  Positive for rash.  Allergic/Immunologic: Negative.   Neurological: Negative.      All other systems negative  unless noted above in HPI  Past medical/social/surgical/family history have been reviewed and are unchanged unless specifically indicated below.  No changes  Medication List: Current Outpatient Medications  Medication Sig Dispense Refill   albuterol (VENTOLIN HFA) 108 (90 Base) MCG/ACT inhaler Inhale two puffs every 4-6 hours if needed for cough or wheeze. 1 each 1   Cholecalciferol (VITAMIN D) 50 MCG (2000 UT) CAPS Take by mouth daily.     famotidine (PEPCID) 20 MG tablet Take 1 tablet (20 mg total) by mouth 2 (two) times daily. 60 tablet 5   fexofenadine (ALLEGRA) 180 MG tablet Take 1 tablet (180 mg total) by mouth in the morning and at bedtime. 60 tablet 5   folic acid (FOLVITE) 1 MG tablet Take 1 tablet by mouth daily.     hydrocortisone 2.5 % cream APP EXT AA BID FOR FACIAL RASH     hydroxychloroquine (PLAQUENIL) 200 MG tablet Take 1 tablet by mouth twice daily Monday through Friday only. 120 tablet 0   methotrexate (RHEUMATREX) 2.5 MG tablet Take 15 mg by mouth once a week.     modafinil (PROVIGIL) 100 MG tablet Take 100 mg by mouth daily.     Olopatadine HCl (PATADAY) 0.2 % SOLN Place 1 drop into both eyes daily as needed. 2.5 mL 5   Olopatadine-Mometasone (RYALTRIS) 665-25 MCG/ACT SUSP Apply 2 sprays each nostril twice a day as needed 29 g 5  traMADol (ULTRAM) 50 MG tablet Take 1 tablet (50 mg total) by mouth 3 (three) times daily as needed. 30 tablet 2   triamcinolone (KENALOG) 0.1 % paste Place onto teeth.     triamcinolone cream (KENALOG) 0.1 % Apply 1 application topically 2 (two) times daily.     valACYclovir (VALTREX) 500 MG tablet Take 1 tablet (500 mg total) by mouth daily. 30 tablet 12   venlafaxine XR (EFFEXOR-XR) 75 MG 24 hr capsule Take 3 capsules (225 mg total) by mouth daily with breakfast. 90 capsule 12   gabapentin (NEURONTIN) 600 MG tablet Take 1 tablet (600 mg total) by mouth at bedtime. 30 tablet 3   No current facility-administered medications for this visit.      Known medication allergies: No Known Allergies   Physical examination: Blood pressure 122/80, pulse 71, temperature 97.7 F (36.5 C), resp. rate 18, height '5\' 1"'$  (1.549 m), weight 157 lb 12.8 oz (71.6 kg), SpO2 99 %.  General: Alert, interactive, in no acute distress. HEENT: PERRLA, TMs pearly gray, turbinates non-edematous without discharge, post-pharynx non erythematous. Neck: Supple without lymphadenopathy. Lungs: Clear to auscultation without wheezing, rhonchi or rales. {no increased work of breathing. CV: Normal S1, S2 without murmurs. Abdomen: Nondistended, nontender. Skin: Warm and dry, without lesions or rashes. Extremities:  No clubbing, cyanosis or edema. Neuro:   Grossly intact.  Diagnositics/Labs: None today  Assessment and plan: Dermatitis -chronic urticaria, atopic dermatitis -description and appearance of the rash appears to be a variant of hives like cholinergic hives. Hives are intermittent and can be very itchy.  Physical features can worsen hives like pressure and change in body temperature.   labs were reassuring.   -for management of hives continue Allegra '180mg'$  1 tab twice a day with Pepcid '20mg'$  1 tab twice a day (you can take Pepcid and Prevacid together as they work differently for reflux control).    - for blistering rash on face continue use of Tacrolimus as directed by your dermatologist.  Tacrolimus is a non-steroid ointment that can be used twice a day as needed.   - discussed Dupixent as add on therapy for improved skin control.  Benefits, risk and protocol discussed.  Information brochure provided.  Will have Tammy, our nurse coordinator, call you about starting.   Adverse food reaction -continue avoidance of dairy and mushrooms -IgE levels are negative thus if you would like to eat these foods can perform in-office food challenges.   Environmental allergies -environmental allergy panel was negative.  Once skin is under better control consider  allergy testing by skin prick testing -continue Allegra as above -for itchy/watery eyes can use Pataday 1 drop each eye daily as needed -for nasal congestion/drainage try Ryaltris nasal spray.  This is a combo spray with a steroid (mometasone) for congestion control and antihistamine (olopatadine) for drainage control.  Use 2 sprays each nostril twice a day as needed.  Sample provided; let us know if effective and can send in a prescription   Reactive airway -symptoms relieved with albuterol use -have access to albuterol inhaler 2 puffs every 4-6 hours as needed for cough/wheeze/shortness of breath/chest tightness.  May use 15-20 minutes prior to activity.   Monitor frequency of use.    Breathing control goals:  Full participation in all desired activities (may need albuterol before activity) Albuterol use two time or less a week on average (not counting use with activity) Cough interfering with sleep two time or less a month Oral steroids no more than  once a year No hospitalizations  Follow-up in 4-6 months or sooner if needed  I appreciate the opportunity to take part in Jenavie's care. Please do not hesitate to contact me with questions.  Sincerely,   Prudy Feeler, MD Allergy/Immunology [facility] [place] [facility] [place]

## 2022-07-30 ENCOUNTER — Other Ambulatory Visit: Payer: Self-pay

## 2022-07-30 MED ORDER — RYALTRIS 665-25 MCG/ACT NA SUSP: NASAL | 5 refills | Status: DC

## 2022-08-01 ENCOUNTER — Telehealth: Payer: Self-pay | Admitting: *Deleted

## 2022-08-01 NOTE — Telephone Encounter (Signed)
Spoke to patient and advised approval and submit to [facility] with instruction on delivery, storage and make appt for initial loading dose in clinic

## 2022-08-01 NOTE — Telephone Encounter (Signed)
L/m for patient to contact me to advise approval, copay card and submit to [facility] for Dupixent 

## 2022-08-01 NOTE — Telephone Encounter (Signed)
-----   Message from [place], MD sent at 07/27/2022  4:29 PM EDT ----- Discussed Dupixent with this patient for her hives and other atopic dermatitis skin conditions.  She would like to move forward with Dupixent.

## 2022-08-03 ENCOUNTER — Telehealth: Payer: Self-pay

## 2022-08-03 NOTE — Telephone Encounter (Signed)
Ryaltris [id number] MCG/ACT PA....  KEY: [id number] created: 08/03/22 sent to plan for review.  PA Approved: Approved on September 2 Effective from 08/03/2022 through 08/02/2023.  Resending prescription to pharmacy.

## 2022-08-07 MED ORDER — RYALTRIS 665-25 MCG/ACT NA SUSP: NASAL | 5 refills | Status: AC

## 2022-08-07 NOTE — Addendum Note (Signed)
Addended by: Tommas Olp B on: 08/07/2022 12:48 PM   Modules accepted: Orders

## 2022-08-28 ENCOUNTER — Ambulatory Visit (INDEPENDENT_AMBULATORY_CARE_PROVIDER_SITE_OTHER): Payer: BC Managed Care – PPO | Admitting: *Deleted

## 2022-08-28 DIAGNOSIS — L209 Atopic dermatitis, unspecified: Secondary | ICD-10-CM | POA: Diagnosis not present

## 2022-08-28 MED ORDER — DUPILUMAB 300 MG/2ML ~~LOC~~ SOSY
600.0000 mg | PREFILLED_SYRINGE | Freq: Once | SUBCUTANEOUS | Status: AC
  Administered 2022-08-28: 600 mg via SUBCUTANEOUS

## 2022-08-28 NOTE — Progress Notes (Signed)
Immunotherapy   Patient Details  Name: Evelyn [name] MRN: [id number] Date of Birth: [birth year]  Burgess Amor started injections for  Dupixent     Frequency: Every 2 Weeks  Epi-Pen: Not Required  Consent signed and patient instructions given. Patient started Dupixent today and received '600mg'$  loading dose. Patient waited 30 minutes in office and did not have any issues.    Catharina Pica Fernandez-Vernon 08/28/2022, 9:02 AM

## 2022-08-29 ENCOUNTER — Telehealth: Payer: Self-pay | Admitting: *Deleted

## 2022-08-29 ENCOUNTER — Other Ambulatory Visit (HOSPITAL_COMMUNITY): Payer: Self-pay

## 2022-08-29 MED ORDER — DUPIXENT 300 MG/2ML ~~LOC~~ SOSY
300.0000 mg | PREFILLED_SYRINGE | SUBCUTANEOUS | 11 refills | Status: DC
  Filled 2022-08-29 – 2022-09-07 (×2): qty 4, 28d supply, fill #0
  Filled 2022-10-11: qty 4, 28d supply, fill #1
  Filled 2022-11-09 – 2022-11-14 (×3): qty 4, 28d supply, fill #2
  Filled 2022-12-06: qty 4, 28d supply, fill #3
  Filled 2023-01-02: qty 4, 28d supply, fill #4
  Filled 2023-02-05: qty 4, 28d supply, fill #5
  Filled 2023-02-28 – 2023-03-14 (×2): qty 4, 28d supply, fill #6
  Filled 2023-04-10: qty 4, 28d supply, fill #7
  Filled 2023-05-09: qty 4, 28d supply, fill #8
  Filled 2023-06-17: qty 4, 28d supply, fill #9
  Filled 2023-07-11: qty 4, 28d supply, fill #10
  Filled 2023-08-15: qty 4, 28d supply, fill #11

## 2022-08-29 NOTE — Telephone Encounter (Signed)
patient advised change in Dupixent Rx from [facility] to [facility] due to [facility] no longer dispensing biologics

## 2022-08-30 ENCOUNTER — Other Ambulatory Visit (HOSPITAL_COMMUNITY): Payer: Self-pay

## 2022-09-07 ENCOUNTER — Other Ambulatory Visit (HOSPITAL_COMMUNITY): Payer: Self-pay

## 2022-09-11 ENCOUNTER — Ambulatory Visit (INDEPENDENT_AMBULATORY_CARE_PROVIDER_SITE_OTHER): Payer: BC Managed Care – PPO | Admitting: *Deleted

## 2022-09-11 DIAGNOSIS — L209 Atopic dermatitis, unspecified: Secondary | ICD-10-CM | POA: Diagnosis not present

## 2022-09-11 MED ORDER — DUPILUMAB 300 MG/2ML ~~LOC~~ SOSY
300.0000 mg | PREFILLED_SYRINGE | SUBCUTANEOUS | Status: AC
  Administered 2022-09-11 – 2023-10-11 (×21): 300 mg via SUBCUTANEOUS

## 2022-09-18 ENCOUNTER — Other Ambulatory Visit (HOSPITAL_COMMUNITY): Payer: Self-pay

## 2022-09-19 ENCOUNTER — Other Ambulatory Visit (HOSPITAL_COMMUNITY): Payer: Self-pay

## 2022-09-20 ENCOUNTER — Other Ambulatory Visit (HOSPITAL_COMMUNITY): Payer: Self-pay | Admitting: Internal Medicine

## 2022-09-20 DIAGNOSIS — R1010 Upper abdominal pain, unspecified: Secondary | ICD-10-CM

## 2022-09-25 ENCOUNTER — Ambulatory Visit (INDEPENDENT_AMBULATORY_CARE_PROVIDER_SITE_OTHER): Payer: BC Managed Care – PPO | Admitting: *Deleted

## 2022-09-25 DIAGNOSIS — L209 Atopic dermatitis, unspecified: Secondary | ICD-10-CM

## 2022-09-28 ENCOUNTER — Encounter (HOSPITAL_COMMUNITY)
Admission: RE | Admit: 2022-09-28 | Discharge: 2022-09-28 | Disposition: A | Discharge status: Home / Self Care | Payer: BC Managed Care – PPO | Source: Ambulatory Visit | Attending: Internal Medicine | Admitting: Internal Medicine

## 2022-09-28 DIAGNOSIS — R1010 Upper abdominal pain, unspecified: Secondary | ICD-10-CM | POA: Insufficient documentation

## 2022-09-28 MED ORDER — TECHNETIUM TC 99M MEBROFENIN IV KIT
5.5000 | PACK | Freq: Once | INTRAVENOUS | Status: AC | PRN
  Administered 2022-09-28: 5.5 via INTRAVENOUS

## 2022-10-09 ENCOUNTER — Ambulatory Visit (INDEPENDENT_AMBULATORY_CARE_PROVIDER_SITE_OTHER): Payer: BC Managed Care – PPO

## 2022-10-09 DIAGNOSIS — L209 Atopic dermatitis, unspecified: Secondary | ICD-10-CM

## 2022-10-11 ENCOUNTER — Other Ambulatory Visit (HOSPITAL_COMMUNITY): Payer: Self-pay

## 2022-10-19 ENCOUNTER — Other Ambulatory Visit (HOSPITAL_COMMUNITY): Payer: Self-pay

## 2022-10-22 ENCOUNTER — Other Ambulatory Visit (HOSPITAL_COMMUNITY): Payer: Self-pay

## 2022-10-23 ENCOUNTER — Ambulatory Visit (INDEPENDENT_AMBULATORY_CARE_PROVIDER_SITE_OTHER): Payer: BC Managed Care – PPO

## 2022-10-23 DIAGNOSIS — L209 Atopic dermatitis, unspecified: Secondary | ICD-10-CM

## 2022-11-06 ENCOUNTER — Ambulatory Visit (INDEPENDENT_AMBULATORY_CARE_PROVIDER_SITE_OTHER): Payer: BC Managed Care – PPO

## 2022-11-06 DIAGNOSIS — L209 Atopic dermatitis, unspecified: Secondary | ICD-10-CM | POA: Diagnosis not present

## 2022-11-08 ENCOUNTER — Other Ambulatory Visit (HOSPITAL_COMMUNITY): Payer: Self-pay

## 2022-11-09 ENCOUNTER — Other Ambulatory Visit (HOSPITAL_COMMUNITY): Payer: Self-pay

## 2022-11-14 ENCOUNTER — Other Ambulatory Visit: Payer: Self-pay

## 2022-11-14 ENCOUNTER — Other Ambulatory Visit (HOSPITAL_COMMUNITY): Payer: Self-pay

## 2022-11-15 ENCOUNTER — Other Ambulatory Visit (HOSPITAL_COMMUNITY): Payer: Self-pay

## 2022-11-20 ENCOUNTER — Ambulatory Visit (INDEPENDENT_AMBULATORY_CARE_PROVIDER_SITE_OTHER): Payer: BC Managed Care – PPO

## 2022-11-20 DIAGNOSIS — L209 Atopic dermatitis, unspecified: Secondary | ICD-10-CM | POA: Diagnosis not present

## 2022-12-06 ENCOUNTER — Other Ambulatory Visit (HOSPITAL_COMMUNITY): Payer: Self-pay

## 2022-12-06 ENCOUNTER — Other Ambulatory Visit: Payer: Self-pay

## 2022-12-07 ENCOUNTER — Other Ambulatory Visit: Payer: Self-pay

## 2022-12-07 ENCOUNTER — Other Ambulatory Visit (HOSPITAL_COMMUNITY): Payer: Self-pay

## 2022-12-13 ENCOUNTER — Ambulatory Visit (INDEPENDENT_AMBULATORY_CARE_PROVIDER_SITE_OTHER): Payer: BC Managed Care – PPO | Admitting: Allergy

## 2022-12-13 ENCOUNTER — Encounter: Payer: Self-pay | Admitting: Allergy

## 2022-12-13 ENCOUNTER — Other Ambulatory Visit: Payer: Self-pay

## 2022-12-13 ENCOUNTER — Ambulatory Visit: Payer: BC Managed Care – PPO | Admitting: Allergy

## 2022-12-13 ENCOUNTER — Ambulatory Visit: Payer: BC Managed Care – PPO

## 2022-12-13 VITALS — BP 118/80 | HR 82 | Temp 98.3°F | Resp 18

## 2022-12-13 DIAGNOSIS — L509 Urticaria, unspecified: Secondary | ICD-10-CM

## 2022-12-13 DIAGNOSIS — H109 Unspecified conjunctivitis: Secondary | ICD-10-CM

## 2022-12-13 DIAGNOSIS — L209 Atopic dermatitis, unspecified: Secondary | ICD-10-CM | POA: Diagnosis not present

## 2022-12-13 DIAGNOSIS — J310 Chronic rhinitis: Secondary | ICD-10-CM

## 2022-12-13 DIAGNOSIS — H1013 Acute atopic conjunctivitis, bilateral: Secondary | ICD-10-CM

## 2022-12-13 DIAGNOSIS — J4520 Mild intermittent asthma, uncomplicated: Secondary | ICD-10-CM

## 2022-12-13 DIAGNOSIS — T781XXD Other adverse food reactions, not elsewhere classified, subsequent encounter: Secondary | ICD-10-CM

## 2022-12-13 DIAGNOSIS — L2084 Intrinsic (allergic) eczema: Secondary | ICD-10-CM

## 2022-12-13 MED ORDER — ALBUTEROL SULFATE HFA 108 (90 BASE) MCG/ACT IN AERS: INHALATION_SPRAY | RESPIRATORY_TRACT | 1 refills | Status: DC

## 2022-12-13 MED ORDER — RYVENT 6 MG PO TABS: 6.0000 mg | ORAL_TABLET | Freq: Two times a day (BID) | ORAL | 5 refills | Status: DC

## 2022-12-13 NOTE — Progress Notes (Signed)
Follow-up Note  RE: Evelyn [name] MRN: [id number] DOB: 1986/09/11 Date of Office Visit: 12/13/2022   History of present illness: Evelyn [name] is a 37 y.o. female presenting today for follow-up of dermatitis, food reactions, allergic rhinitis and reactive airway.  She was last seen in the office on 07/27/22 by myself.    After this visit she was started on dupixent injections on 08/28/22.  She has continued to receive dupixent every 2 weeks in office.  She has had thus far 7 doses and tolerates injections well without large local or systemic reactions.  She does states she has not had the facial rash which was the blistering type rash since being on dupixent.  She reports only using tacrolimus a handful of times or less on face.  However she has not noted any real improvement with the hive type rash she has on the body.  She states other specialist have seen the hive type rash and recommended xolair.  The allegra and pepcid do not seem to be making a difference with the hive type rash.  She has tried over OTC antihistamine prior to allegra also without much benefit.  She also does not feel the pepcid provides any additional benefit for her reflux control.  She does take lansoprazole for reflux control.     With the cold weather and needing to walk around a lot outside she does note shortness of breath and sometimes cough.  She does have an albuterol inhaler.  She will use albuterol if the symptoms are 'very bad' and does help and may occur about twice in a month.  Has not tried to pretreat this activity to help prevent symptom onset. She has not required any ED/UC visits or systemic steroids since last visit.   She reports runny and stuffy nose symptoms have been horrible.  The allegra does not seem to help this either.  She tried the ryaltris spray was helpful but states it was hard to get from the pharmacy after she got her initial bottle.    She continues to avoid dairy and  mushrooms in the diet.   Review of systems: Review of Systems  Constitutional: Negative.   HENT:         See HPI  Eyes: Negative.   Respiratory:         See HPI  Cardiovascular: Negative.   Gastrointestinal: Negative.   Musculoskeletal: Negative.   Skin:        See HPI  Allergic/Immunologic: Negative.   Neurological: Negative.      All other systems negative unless noted above in HPI  Past medical/social/surgical/family history have been reviewed and are unchanged unless specifically indicated below.  No changes  Medication List: Current Outpatient Medications  Medication Sig Dispense Refill   Cholecalciferol (VITAMIN D) 50 MCG (2000 UT) CAPS Take by mouth daily.     dupilumab (DUPIXENT) 300 MG/2ML prefilled syringe Inject 300 mg into the skin every 14 (fourteen) days. 4 mL 11   famotidine (PEPCID) 20 MG tablet Take 1 tablet (20 mg total) by mouth 2 (two) times daily. 60 tablet 5   folic acid (FOLVITE) 1 MG tablet Take 1 tablet by mouth daily.     hydrocortisone 2.5 % cream APP EXT AA BID FOR FACIAL RASH     hydroxychloroquine (PLAQUENIL) 200 MG tablet Take 1 tablet by mouth twice daily Monday through Friday only. 120 tablet 0   methotrexate (RHEUMATREX) 2.5 MG tablet Take 15 mg by mouth  once a week.     modafinil (PROVIGIL) 100 MG tablet Take 100 mg by mouth daily.     Olopatadine HCl (PATADAY) 0.2 % SOLN Place 1 drop into both eyes daily as needed. 2.5 mL 5   RYVENT 6 MG TABS Take 6 mg by mouth in the morning and at bedtime. 60 tablet 5   traMADol (ULTRAM) 50 MG tablet Take 1 tablet (50 mg total) by mouth 3 (three) times daily as needed. 30 tablet 2   triamcinolone (KENALOG) 0.1 % paste Place onto teeth.     triamcinolone cream (KENALOG) 0.1 % Apply 1 application topically 2 (two) times daily.     valACYclovir (VALTREX) 500 MG tablet Take 1 tablet (500 mg total) by mouth daily. 30 tablet 12   venlafaxine XR (EFFEXOR-XR) 75 MG 24 hr capsule Take 3 capsules (225 mg total) by  mouth daily with breakfast. 90 capsule 12   albuterol (VENTOLIN HFA) 108 (90 Base) MCG/ACT inhaler Inhale two puffs every 4-6 hours if needed for cough or wheeze. 1 each 1   Olopatadine-Mometasone (RYALTRIS) 665-25 MCG/ACT SUSP Apply 2 sprays each nostril twice a day as needed (Patient not taking: Reported on 12/13/2022) 29 g 5   Current Facility-Administered Medications  Medication Dose Route Frequency Provider Last Rate Last Admin   dupilumab (DUPIXENT) prefilled syringe 300 mg  300 mg Subcutaneous Q14 Days Kennith Gain, MD   300 mg at 12/13/22 1500     Known medication allergies: No Known Allergies   Physical examination: Blood pressure 118/80, pulse 82, temperature 98.3 F (36.8 C), temperature source Temporal, resp. rate 18, SpO2 97 %.  General: Alert, interactive, in no acute distress. HEENT: PERRLA, TMs pearly gray, turbinates mildly edematous without discharge, post-pharynx unremarkable. Neck: Supple without lymphadenopathy. Lungs: Clear to auscultation without wheezing, rhonchi or rales. {no increased work of breathing. CV: Normal S1, S2 without murmurs. Abdomen: Nondistended, nontender. Skin: Warm and dry, without lesions or rashes. Extremities:  No clubbing, cyanosis or edema. Neuro:   Grossly intact.  Diagnositics/Labs: None today  Assessment and plan:   Dermatitis--1) eczematous/blistering; 2) urticarial  -description and appearance of the rash on body appears to be a variant of hives- cholinergic hives. Hives are intermittent and can be very itchy.  Physical features can worsen hives like pressure and change in body temperature.   labs were reassuring.   -for management of hives will change allegra to Ryvent '6mg'$  twice a day dosing and stop pepcid as not providing benefit.  If Ryvent brand not covered will send in Carbinoxamine generic prescription -discussed today for hive control could consider adding on Xolair which we have previously discussed however  this would be a second biologic agent as already on dupixent which unfortunately is not providing the hive control I was hoping it would  - for blistering rash (improved on dupixent) on face continue use of Tacrolimus as directed by your dermatologist.  Tacrolimus is a non-steroid ointment that can be used twice a day as needed.   - continue Dupixent injections  Adverse food reaction -continue avoidance of dairy and mushrooms -IgE levels are negative thus if you would like to eat these foods can perform in-office food challenges.   Rhinoconjucntivitis -Ryvent as above -for itchy/watery eyes can use Pataday 1 drop each eye daily as needed -for nasal congestion/drainage use Ryaltris nasal spray.  This is a combo spray with a steroid (mometasone) for congestion control and antihistamine (olopatadine) for drainage control.  Use 2 sprays each  nostril twice a day as needed.  Sample provided.  Reactive airway -symptoms relieved with albuterol use -have access to albuterol inhaler 2 puffs every 4-6 hours as needed for cough/wheeze/shortness of breath/chest tightness.  Use 15-20 minutes prior to activity.   Monitor frequency of use.    Breathing control goals:  Full participation in all desired activities (may need albuterol before activity) Albuterol use two time or less a week on average (not counting use with activity) Cough interfering with sleep two time or less a month Oral steroids no more than once a year No hospitalizations  Follow-up in 4-6 months or sooner if needed I appreciate the opportunity to take part in Chloeann's care. Please do not hesitate to contact me with questions.  Sincerely,   Prudy Feeler, MD Allergy/Immunology [facility] [place] [facility] [place]

## 2022-12-13 NOTE — Patient Instructions (Addendum)
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 Hives are intermittent and can be very itchy.  Physical features can worsen hives like pressure and change in body temperature.   labs were reassuring.   -for management of hives will change allegra to Ryvent '6mg'$  twice a day dosing and stop pepcid as not providing benefit.  If Ryvent brand not covered will send in Carbinoxamine generic prescription -discussed today for hive control could consider adding on Xolair which we have previously discussed however this would be a second biologic agent as already on dupixent which unfortunately is not providing the hive control I was hoping it would  - for blistering rash (improved on dupixent) on face continue use of Tacrolimus as directed by your dermatologist.  Tacrolimus is a non-steroid ointment that can be used twice a day as needed.   - continue Dupixent injections  Adverse food reaction -continue avoidance of dairy and mushrooms -IgE levels are negative thus if you would like to eat these foods can perform in-office food challenges.   Environmental allergies -Ryvent as above -for itchy/watery eyes can use Pataday 1 drop each eye daily as needed -for nasal congestion/drainage use Ryaltris nasal spray.  This is a combo spray with a steroid (mometasone) for congestion control and antihistamine (olopatadine) for drainage control.  Use 2 sprays each nostril twice a day as needed.  Sample provided.  Reactive airway -symptoms relieved with albuterol use -have access to albuterol inhaler 2 puffs every 4-6 hours as needed for cough/wheeze/shortness of breath/chest tightness.  Use 15-20 minutes prior to activity.   Monitor frequency of use.    Breathing control goals:  Full participation in all desired activities (may need albuterol before activity) Albuterol use two time or less a week on average (not  counting use with activity) Cough interfering with sleep two time or less a month Oral steroids no more than once a year No hospitalizations  Follow-up in 4-6 months or sooner if needed

## 2022-12-24 ENCOUNTER — Encounter: Payer: Self-pay | Admitting: Allergy

## 2022-12-25 ENCOUNTER — Telehealth: Payer: Self-pay

## 2022-12-25 ENCOUNTER — Other Ambulatory Visit (HOSPITAL_COMMUNITY): Payer: Self-pay

## 2022-12-25 NOTE — Telephone Encounter (Signed)
PA has been submitted and is pending determination. 

## 2022-12-25 NOTE — Telephone Encounter (Signed)
PA request received for RyVent '6MG'$  tablets through patient and CMM  PA has been submitted and is awaiting determination.  Key: Coralie Carpen

## 2022-12-26 ENCOUNTER — Ambulatory Visit (INDEPENDENT_AMBULATORY_CARE_PROVIDER_SITE_OTHER): Payer: BC Managed Care – PPO

## 2022-12-26 ENCOUNTER — Telehealth: Payer: Self-pay | Admitting: Allergy

## 2022-12-26 DIAGNOSIS — L509 Urticaria, unspecified: Secondary | ICD-10-CM

## 2022-12-26 NOTE — Telephone Encounter (Signed)
Notification of PA required received yesterday. Encounter has been created for this. PA is still pending with insurance.

## 2022-12-26 NOTE — Telephone Encounter (Signed)
Any update on PA status?

## 2022-12-26 NOTE — Telephone Encounter (Signed)
Patient came into office inquiring about her medication PA for Ryvex. Patient stated she would like to know what's going on with this because she hasn't heard anything in a couple of days from her mychart message. Patients call back number is [phone]

## 2023-01-02 ENCOUNTER — Other Ambulatory Visit (HOSPITAL_COMMUNITY): Payer: Self-pay

## 2023-01-03 ENCOUNTER — Other Ambulatory Visit (HOSPITAL_COMMUNITY): Payer: Self-pay

## 2023-01-04 ENCOUNTER — Other Ambulatory Visit (HOSPITAL_COMMUNITY): Payer: Self-pay

## 2023-01-04 NOTE — Telephone Encounter (Signed)
Resubmitting to [facility] and is pending determination for RyVent. Preferred is levocetirizine if this is not covered.   Key: Evelyn [name]

## 2023-01-07 NOTE — Telephone Encounter (Signed)
RyVent PA has been denied please advise on sending in xyzal.

## 2023-01-07 NOTE — Telephone Encounter (Signed)
PA has been DENIED, determination letter has been sent to providers office.

## 2023-01-08 MED ORDER — LEVOCETIRIZINE DIHYDROCHLORIDE 5 MG PO TABS: 5.0000 mg | ORAL_TABLET | Freq: Every evening | ORAL | 5 refills | Status: DC

## 2023-01-08 NOTE — Telephone Encounter (Signed)
Medication has been sent to pharmacy on file.

## 2023-01-08 NOTE — Addendum Note (Signed)
Addended by: Eloy End D on: 01/08/2023 05:20 PM   Modules accepted: Orders

## 2023-01-10 ENCOUNTER — Ambulatory Visit (INDEPENDENT_AMBULATORY_CARE_PROVIDER_SITE_OTHER): Payer: BC Managed Care – PPO

## 2023-01-10 DIAGNOSIS — L209 Atopic dermatitis, unspecified: Secondary | ICD-10-CM

## 2023-01-11 ENCOUNTER — Other Ambulatory Visit (HOSPITAL_COMMUNITY): Payer: Self-pay

## 2023-01-24 ENCOUNTER — Ambulatory Visit (INDEPENDENT_AMBULATORY_CARE_PROVIDER_SITE_OTHER): Payer: BC Managed Care – PPO

## 2023-01-24 ENCOUNTER — Other Ambulatory Visit (HOSPITAL_COMMUNITY)
Admission: RE | Admit: 2023-01-24 | Discharge: 2023-01-24 | Disposition: A | Discharge status: Home / Self Care | Payer: BC Managed Care – PPO | Source: Ambulatory Visit | Attending: Advanced Practice Midwife | Admitting: Advanced Practice Midwife

## 2023-01-24 ENCOUNTER — Ambulatory Visit (INDEPENDENT_AMBULATORY_CARE_PROVIDER_SITE_OTHER): Payer: BC Managed Care – PPO | Admitting: Advanced Practice Midwife

## 2023-01-24 ENCOUNTER — Encounter: Payer: Self-pay | Admitting: Advanced Practice Midwife

## 2023-01-24 VITALS — BP 120/80 | HR 68 | Wt 160.0 lb

## 2023-01-24 DIAGNOSIS — A609 Anogenital herpesviral infection, unspecified: Secondary | ICD-10-CM

## 2023-01-24 DIAGNOSIS — N898 Other specified noninflammatory disorders of vagina: Secondary | ICD-10-CM | POA: Insufficient documentation

## 2023-01-24 DIAGNOSIS — L209 Atopic dermatitis, unspecified: Secondary | ICD-10-CM

## 2023-01-24 DIAGNOSIS — N390 Urinary tract infection, site not specified: Secondary | ICD-10-CM

## 2023-01-24 DIAGNOSIS — B3731 Acute candidiasis of vulva and vagina: Secondary | ICD-10-CM

## 2023-01-24 DIAGNOSIS — Z9851 Tubal ligation status: Secondary | ICD-10-CM

## 2023-01-24 DIAGNOSIS — Z01419 Encounter for gynecological examination (general) (routine) without abnormal findings: Secondary | ICD-10-CM | POA: Diagnosis not present

## 2023-01-24 LAB — POCT URINALYSIS DIPSTICK
Bilirubin, UA: NEGATIVE
Glucose, UA: NEGATIVE
Ketones, UA: NEGATIVE
Nitrite, UA: POSITIVE
Protein, UA: NEGATIVE
Spec Grav, UA: 1.01 — AB (ref 1.010–1.025)
Urobilinogen, UA: 0.2 E.U./dL
pH, UA: 7 (ref 5.0–8.0)

## 2023-01-24 MED ORDER — NITROFURANTOIN MONOHYD MACRO 100 MG PO CAPS: 100.0000 mg | ORAL_CAPSULE | Freq: Two times a day (BID) | ORAL | 0 refills | Status: DC

## 2023-01-24 MED ORDER — FLUCONAZOLE 150 MG PO TABS: 150.0000 mg | ORAL_TABLET | Freq: Once | ORAL | 0 refills | Status: AC

## 2023-01-24 MED ORDER — NYSTATIN-TRIAMCINOLONE 100000-0.1 UNIT/GM-% EX OINT: 1.0000 | TOPICAL_OINTMENT | Freq: Two times a day (BID) | CUTANEOUS | 0 refills | Status: AC

## 2023-01-24 NOTE — Progress Notes (Signed)
Patient here for Problem visit.  Possible Yeast infection and Recurrent UTI's.more so after intercourse.   Had irregular spotting, pressure at night/discomfort and urgency.  Contraception IUD usually does not have periods.   Pt ok with doing STD screening on swab

## 2023-01-25 ENCOUNTER — Encounter: Payer: Self-pay | Admitting: *Deleted

## 2023-01-26 NOTE — Progress Notes (Signed)
GYNECOLOGY ANNUAL PREVENTATIVE CARE ENCOUNTER NOTE  History:     Evelyn [name] is a 37 y.o. [phone] female here for a routine annual gynecologic exam.  Current complaints: patient reports recurrent UTIs, most recently seen and treated at Urgent Care Dec 2023.   Denies abnormal vaginal bleeding, discharge, pelvic pain, problems with intercourse or other gynecologic concerns.   + Penetrative sex, 16 female partner.    Gynecologic History No LMP recorded. (Menstrual status: IUD). Contraception: tubal ligation Last Pap: 03/29/2022. Result was normal with negative HPV  Obstetric History OB History  Gravida Para Term Preterm AB Living  '4 4 2 2 '$ 0 4  SAB IAB Ectopic Multiple Live Births  0 0 0 0 4    # Outcome Date GA Lbr Len/2nd Weight Sex Delivery Anes PTL Lv  4 Preterm 08/05/14 [redacted]w[redacted]d 1 lb 14.3 oz (0.86 kg) F CS-LTranv Spinal  LIV  3 Term 12/15/09 359w0d7 lb 6 oz (3.345 kg)  Vag-Spont   LIV  2 Term 03/01/08 3960w0d lb 1 oz (3.204 kg)  Vag-Spont   LIV  1 Preterm 12/13/02 36w35w0dlb 1 oz (2.75 kg)  Vag-Spont   LIV     Birth Comments: IOL for Pre-eclampsia    Past Medical History:  Diagnosis Date   Depression    post partum   GERD (gastroesophageal reflux disease)    Systemic lupus erythematosus (HCC)[place] Umbilical hernia     Past Surgical History:  Procedure Laterality Date   BREAST LUMPECTOMY WITH RADIOACTIVE SEED LOCALIZATION Right 03/09/2020   Procedure: RIGHT BREAST LUMPECTOMY WITH RADIOACTIVE SEED LOCALIZATION;  Surgeon: CornErroll Luna;  [facility];  Laterality: Right;   CARPAL TUNNEL RELEASE Right 09/26/2018   Procedure: RIGHT CARPAL TUNNEL RELEASE;  Surgeon: Xu, Leandrew Koyanagi;  [place] [facility];  Laterality: Right;   CARPAL TUNNEL RELEASE Left 2017   CESAREAN SECTION N/A 08/05/2014   Procedure: CESAREAN SECTION;  Surgeon: DyanJerelyn Charles;  [facility];  Service:  Obstetrics;  Laterality: N/A;   KNEE SURGERY Left 2000   LAPAROSCOPIC BILATERAL SALPINGECTOMY N/A 12/13/2015   Procedure: LAPAROSCOPIC BILATERAL SALPINGECTOMY;  Surgeon: MyraEmily Filbert;  [facility];  Service: Gynecology;  Laterality: N/A;   PERINEOPLASTY N/A 02/20/2016   Procedure: PERINEOPLASTY;  Surgeon: MyraEmily Filbert;  [place];  Service: Gynecology;  Laterality: N/A;   PUBOVAGINAL SLING N/A 12/13/2015   Procedure: PUBOGaynelle Arabianurgeon: MyraEmily Filbert;  [facility];  Service: Gynecology;  Laterality: N/A;   RECTOCELE REPAIR N/A 02/20/2016   Procedure: POSTERIOR REPAIR (RECTOCELE);  Surgeon: MyraEmily Filbert;  [facility];  Service: Gynecology;  Laterality: N/A;   UMBILICAL HERNIA REPAIR  2011    Current Outpatient Medications on File Prior to Visit  Medication Sig Dispense Refill   albuterol (VENTOLIN HFA) 108 (90 Base) MCG/ACT inhaler Inhale two puffs every 4-6 hours if needed for cough or wheeze. 1 each 1   Cholecalciferol (VITAMIN D) 50 MCG (2000 UT) CAPS Take by mouth daily.     dupilumab (DUPIXENT) 300 MG/2ML prefilled syringe Inject 300 mg into the skin every 14 (fourteen) days. 4 mL 11   famotidine (PEPCID) 20 MG tablet Take 1 tablet (20 mg total) by mouth 2 (two) times daily. 60 tablet 5   folic acid (FOLVITE) 1 MG tablet Take 1 tablet  by mouth daily.     hydroxychloroquine (PLAQUENIL) 200 MG tablet Take 1 tablet by mouth twice daily Monday through Friday only. 120 tablet 0   levocetirizine (XYZAL) 5 MG tablet Take 1 tablet (5 mg total) by mouth every evening. 30 tablet 5   methotrexate (RHEUMATREX) 2.5 MG tablet Take 15 mg by mouth once a week.     modafinil (PROVIGIL) 100 MG tablet Take 100 mg by mouth daily.     Olopatadine HCl (PATADAY) 0.2 % SOLN Place 1 drop into both eyes daily as needed. 2.5 mL 5   Olopatadine-Mometasone (RYALTRIS) 665-25 MCG/ACT SUSP Apply 2 sprays each nostril twice a day as needed 29 g 5   RYVENT 6 MG TABS Take 6 mg by  mouth in the morning and at bedtime. 60 tablet 5   traMADol (ULTRAM) 50 MG tablet Take 1 tablet (50 mg total) by mouth 3 (three) times daily as needed. 30 tablet 2   triamcinolone (KENALOG) 0.1 % paste Place onto teeth.     valACYclovir (VALTREX) 500 MG tablet Take 1 tablet (500 mg total) by mouth daily. 30 tablet 12   venlafaxine XR (EFFEXOR-XR) 75 MG 24 hr capsule Take 3 capsules (225 mg total) by mouth daily with breakfast. 90 capsule 12   Current Facility-Administered Medications on File Prior to Visit  Medication Dose Route Frequency Provider Last Rate Last Admin   dupilumab (DUPIXENT) prefilled syringe 300 mg  300 mg Subcutaneous Q14 Days Kennith Gain, MD   300 mg at 01/24/23 1730    No Known Allergies  Social History:  reports that she has never smoked. She has never used smokeless tobacco. She reports that she does not currently use alcohol. She reports that she does not use drugs.  Family History  Problem Relation Age of Onset   Healthy Mother    Other Father        never met her father - unsure of his history   Healthy Sister    Allergy (severe) Sister        sunblocks and mosquito repelent.   Healthy Brother    Healthy Daughter    Asthma Son    Healthy Son    Healthy Son    Healthy Son    Breast cancer Cousin 26       undergoing chemo/radiation   Heart disease Neg Hx    Neurologic Disorder Neg Hx    Colon cancer Neg Hx    Esophageal cancer Neg Hx     The following portions of the patient's history were reviewed and updated as appropriate: allergies, current medications, past family history, past medical history, past social history, past surgical history and problem list.  Review of Systems Pertinent items noted in HPI and remainder of comprehensive ROS otherwise negative.  Physical Exam:  BP 120/80   Pulse 68   Wt 160 lb (72.6 kg)   BMI 30.23 kg/m  CONSTITUTIONAL: Well-developed, well-nourished female in no acute distress.  HENT:   Normocephalic, atraumatic EYES: Conjunctivae and EOM are normal. Pupils are equal, round, and reactive to light. No scleral icterus.  SKIN: Skin is warm and dry. No rash noted. Not diaphoretic. No erythema. No pallor. MUSCULOSKELETAL: Normal range of motion. No tenderness.  No cyanosis, clubbing, or edema. NEUROLOGIC: Alert and oriented to person, place, and time. Normal reflexes, muscle tone coordination.  PSYCHIATRIC: Normal mood and affect. Normal behavior. Normal judgment and thought content. CARDIOVASCULAR: Normal heart rate noted, regular rhythm RESPIRATORY: Clear to auscultation  bilaterally. Effort and breath sounds normal, no problems with respiration noted. PELVIC: Normal appearing external genitalia and urethral meatus; normal appearing vaginal mucosa and cervix.  No abnormal vaginal discharge noted.  Pap smear obtained.  Normal uterine size, no other palpable masses, no uterine or adnexal tenderness.  Performed in the presence of a chaperone.   Assessment and Plan:    1. Recurrent UTI - EMR reviewed: --[facility] visit 11/07/2022 for sinusitis and cough --UC visit 09/13/2022 for dysuria, narrative focuses on vaginal itching treated with Diflucan - Hematuria and nitrites on urine dipstick today - Will initiate ABX while culture is processed per patient request - Diflucan as needed in the event of rebound yeast infection  - POCT Urinalysis Dipstick - Urine Culture - nitrofurantoin, macrocrystal-monohydrate, (MACROBID) 100 MG capsule; Take 1 capsule (100 mg total) by mouth 2 (two) times daily.  Dispense: 14 capsule; Refill: 0 - fluconazole (DIFLUCAN) 150 MG tablet; Take 1 tablet (150 mg total) by mouth once for 1 dose. For rebound yeast infection once treatment for UTI is completed. May repeat in 2 days if still having symptoms.  Dispense: 2 tablet; Refill: 0  2. Vaginal discharge - No overt abnormal discharge during blind swab - Cervicovaginal ancillary only( )  3. HSV (herpes  simplex virus) anogenital infection - No lesions visualized  4. Hx of tubal ligation   5. Vulvovaginal candidiasis - Pubic hair along mons pubis and groin is shaved, encouraged patient to d/c shaving due to skin trauma if also concerned about recurrent abnormal vaginal discharge - Visible fungal growth within short pubic hair framing introitus. Rx advised - Advised patient to consider removing long artifical nail tips to prevent transfer of bacteria during peri care as well as excoriation which could present vector for infection - nystatin-triamcinolone ointment (MYCOLOG); Apply 1 Application topically 2 (two) times daily.  Dispense: 30 g; Refill: 0  Will follow up results of urine culture and CV swab and manage accordingly. Routine preventative health maintenance measures emphasized. Please refer to After Visit Summary for other counseling recommendations.     Mallie Snooks, [place], MSN, CNM Certified Nurse Midwife, Product/process development scientist for [employer], [place]

## 2023-01-28 LAB — CERVICOVAGINAL ANCILLARY ONLY
Bacterial Vaginitis (gardnerella): NEGATIVE
Candida Glabrata: NEGATIVE
Candida Vaginitis: POSITIVE — AB
Chlamydia: NEGATIVE
Comment: NEGATIVE
Comment: NEGATIVE
Comment: NEGATIVE
Comment: NEGATIVE
Comment: NEGATIVE
Comment: NORMAL
Neisseria Gonorrhea: NEGATIVE
Trichomonas: NEGATIVE

## 2023-01-29 ENCOUNTER — Other Ambulatory Visit: Payer: Self-pay | Admitting: *Deleted

## 2023-01-29 LAB — URINE CULTURE

## 2023-01-29 MED ORDER — TRIAMCINOLONE ACETONIDE 0.1 % EX OINT: 1.0000 | TOPICAL_OINTMENT | Freq: Two times a day (BID) | CUTANEOUS | 0 refills | Status: AC

## 2023-01-29 MED ORDER — NYSTATIN 100000 UNIT/GM EX OINT: 1.0000 | TOPICAL_OINTMENT | Freq: Two times a day (BID) | CUTANEOUS | 0 refills | Status: AC

## 2023-01-29 MED ORDER — FLUCONAZOLE 150 MG PO TABS: 150.0000 mg | ORAL_TABLET | Freq: Once | ORAL | 1 refills | Status: AC

## 2023-01-29 NOTE — Progress Notes (Signed)
Insurance denied Mycolog, sent in separate rx's instead

## 2023-02-05 ENCOUNTER — Other Ambulatory Visit (HOSPITAL_COMMUNITY): Payer: Self-pay

## 2023-02-11 ENCOUNTER — Ambulatory Visit: Payer: BC Managed Care – PPO

## 2023-02-12 ENCOUNTER — Other Ambulatory Visit (HOSPITAL_COMMUNITY): Payer: Self-pay

## 2023-02-21 ENCOUNTER — Ambulatory Visit (INDEPENDENT_AMBULATORY_CARE_PROVIDER_SITE_OTHER): Payer: Medicaid Other

## 2023-02-21 DIAGNOSIS — L209 Atopic dermatitis, unspecified: Secondary | ICD-10-CM

## 2023-02-28 ENCOUNTER — Other Ambulatory Visit (HOSPITAL_COMMUNITY): Payer: Self-pay

## 2023-02-28 ENCOUNTER — Other Ambulatory Visit: Payer: Self-pay

## 2023-03-01 ENCOUNTER — Encounter (INDEPENDENT_AMBULATORY_CARE_PROVIDER_SITE_OTHER): Payer: BC Managed Care – PPO | Admitting: Ophthalmology

## 2023-03-01 ENCOUNTER — Other Ambulatory Visit: Payer: Self-pay

## 2023-03-01 ENCOUNTER — Other Ambulatory Visit (HOSPITAL_COMMUNITY): Payer: Self-pay

## 2023-03-04 ENCOUNTER — Other Ambulatory Visit: Payer: Self-pay

## 2023-03-05 ENCOUNTER — Other Ambulatory Visit: Payer: Self-pay

## 2023-03-06 ENCOUNTER — Other Ambulatory Visit: Payer: Self-pay

## 2023-03-06 ENCOUNTER — Other Ambulatory Visit (HOSPITAL_COMMUNITY): Payer: Self-pay

## 2023-03-08 ENCOUNTER — Other Ambulatory Visit: Payer: Self-pay

## 2023-03-08 ENCOUNTER — Ambulatory Visit (INDEPENDENT_AMBULATORY_CARE_PROVIDER_SITE_OTHER): Payer: Medicaid Other

## 2023-03-08 ENCOUNTER — Other Ambulatory Visit (HOSPITAL_COMMUNITY): Payer: Self-pay

## 2023-03-08 DIAGNOSIS — L209 Atopic dermatitis, unspecified: Secondary | ICD-10-CM

## 2023-03-11 ENCOUNTER — Encounter (INDEPENDENT_AMBULATORY_CARE_PROVIDER_SITE_OTHER): Payer: Medicaid Other | Admitting: Ophthalmology

## 2023-03-11 ENCOUNTER — Encounter (INDEPENDENT_AMBULATORY_CARE_PROVIDER_SITE_OTHER): Payer: Self-pay

## 2023-03-11 DIAGNOSIS — M3219 Other organ or system involvement in systemic lupus erythematosus: Secondary | ICD-10-CM

## 2023-03-11 DIAGNOSIS — Z79899 Other long term (current) drug therapy: Secondary | ICD-10-CM

## 2023-03-11 DIAGNOSIS — H35413 Lattice degeneration of retina, bilateral: Secondary | ICD-10-CM

## 2023-03-11 DIAGNOSIS — H5213 Myopia, bilateral: Secondary | ICD-10-CM

## 2023-03-11 DIAGNOSIS — H33303 Unspecified retinal break, bilateral: Secondary | ICD-10-CM

## 2023-03-14 ENCOUNTER — Other Ambulatory Visit (HOSPITAL_COMMUNITY): Payer: Self-pay

## 2023-03-18 ENCOUNTER — Other Ambulatory Visit (HOSPITAL_COMMUNITY): Payer: Self-pay

## 2023-03-19 ENCOUNTER — Other Ambulatory Visit (HOSPITAL_COMMUNITY): Payer: Self-pay

## 2023-03-19 ENCOUNTER — Other Ambulatory Visit: Payer: Self-pay

## 2023-03-21 ENCOUNTER — Ambulatory Visit (INDEPENDENT_AMBULATORY_CARE_PROVIDER_SITE_OTHER): Payer: Medicaid Other

## 2023-03-21 DIAGNOSIS — L209 Atopic dermatitis, unspecified: Secondary | ICD-10-CM | POA: Diagnosis not present

## 2023-03-22 ENCOUNTER — Ambulatory Visit: Payer: Medicaid Other

## 2023-03-28 ENCOUNTER — Other Ambulatory Visit (HOSPITAL_COMMUNITY): Payer: Self-pay

## 2023-03-29 ENCOUNTER — Ambulatory Visit: Payer: Medicaid Other | Admitting: Physician Assistant

## 2023-03-29 ENCOUNTER — Other Ambulatory Visit (INDEPENDENT_AMBULATORY_CARE_PROVIDER_SITE_OTHER): Payer: Medicaid Other

## 2023-03-29 ENCOUNTER — Encounter: Payer: Self-pay | Admitting: Physician Assistant

## 2023-03-29 DIAGNOSIS — G8929 Other chronic pain: Secondary | ICD-10-CM | POA: Diagnosis not present

## 2023-03-29 DIAGNOSIS — M25562 Pain in left knee: Secondary | ICD-10-CM | POA: Diagnosis not present

## 2023-03-29 DIAGNOSIS — M722 Plantar fascial fibromatosis: Secondary | ICD-10-CM

## 2023-03-29 NOTE — Progress Notes (Signed)
Office Visit Note   Patient: Evelyn [name]           Date of Birth: May 24, 1986           MRN: [id number] Visit Date: 03/29/2023              Requested by: Gwenlyn Found, MD [id number] [place],  [place] [address] PCP: Gwenlyn Found, MD   Assessment & Plan: Visit Diagnoses:  1. Chronic pain of left knee   2. Plantar fasciitis, bilateral     Plan: Impression is chronic left knee pain with underlying partial-thickness cartilage loss of the lateral compartment in addition to bilateral heel plantar fasciitis.  In regards to the knee, she had great relief following viscosupplementation injection in the past so we will submit for reapproval.  Follow-up with [place] once approved.  In regards to the plantar fasciitis, have recommended full foot orthotics in addition to a stretching regimen.  We have provided her with a handout for the stretches.  We have also discussed cortisone injection should her symptoms not improve.  She will follow-up with [place] as needed for her feet.  Follow-Up Instructions: Return for once approved for visco inj.   Orders:  Orders Placed This Encounter  Procedures   XR KNEE 3 VIEW LEFT   XR Os Calcis Left   XR Os Calcis Right   No orders of the defined types were placed in this encounter.     Procedures: No procedures performed   Clinical Data: No additional findings.   Subjective: Chief Complaint  Patient presents with   Left Knee - Pain    HPI patient is a pleasant 37 year old female who comes in today with left knee pain and bilateral heel pain.  In regards to her left knee, she was seen by [place] about a year and a half ago for this where she had partial thickness cartilage loss to the lateral compartment.  She underwent Synvisc 1 injection which significantly helped until about a month and a half ago.  No new injury or change in activity.  Symptoms are primarily to the retropatellar aspect and are constant.  Pain is worse at the end of  the day as well as with stair climbing.  She has been getting Tylenol 3 from her rheumatologist.  In regards to the heel pain, the pain is primarily at the plantar fascia insertion.  Symptoms are are pretty bad first thing in the morning as well as throughout the day and at the end of the day when she is trying to sleep.  She has been doing certain stretches for about 2 weeks without relief.  No previous cortisone injection.  Of note, she tells me she has a history of lupus and RA.  Review of Systems as detailed in HPI.  All others reviewed and are negative.   Objective: Vital Signs: There were no vitals taken for this visit.  Physical Exam well-developed well-nourished female no acute distress.  Alert and oriented x 3.  Ortho Exam left knee exam shows no effusion.  Medial and lateral joint line tenderness.  Moderate patellofemoral crepitus.  Ligaments are stable.  She is neurovascular intact distally.  Bilateral heel exam: Moderate tenderness at the heel over the plantar fascia insertion.  Dorsiflexion to neutral.  She is neurovascular intact distally.  Specialty Comments:  No specialty comments available.  Imaging: XR Os Calcis Left  Result Date: 03/29/2023 No acute or structural abnormalities  XR Os Calcis  Right  Result Date: 03/29/2023 No acute or structural abnormalities  XR KNEE 3 VIEW LEFT  Result Date: 03/29/2023 No acute or structural abnormalities.  No evidence of bony erosion    PMFS History: Patient Active Problem List   Diagnosis Date Noted   Pain of left side of body 07/02/2019   Paresthesia 07/02/2019   Vulvovaginal candidiasis 06/23/2019   Gastroesophageal reflux disease 01/27/2019   Systemic lupus erythematosus (HCC) 11/19/2018   Raynaud's disease without gangrene 11/19/2018   High risk medication use 11/19/2018   History of bilateral carpal tunnel release 11/19/2018   Autoimmune disease (HCC) 10/08/2018   Carpal tunnel syndrome, right upper limb 09/26/2018    Radicular pain in right arm 09/16/2018   History of pregnancy induced hypertension 08/03/2014   History of pre-eclampsia 08/03/2014   Chronic back pain 01/14/2014   Allergic rhinitis 01/14/2014   Past Medical History:  Diagnosis Date   Depression    post partum   GERD (gastroesophageal reflux disease)    Systemic lupus erythematosus (HCC)    Umbilical hernia     Family History  Problem Relation Age of Onset   Healthy Mother    Other Father        never met her father - unsure of his history   Healthy Sister    Allergy (severe) Sister        sunblocks and mosquito repelent.   Healthy Brother    Healthy Daughter    Asthma Son    Healthy Son    Healthy Son    Healthy Son    Breast cancer Cousin 26       undergoing chemo/radiation   Heart disease Neg Hx    Neurologic Disorder Neg Hx    Colon cancer Neg Hx    Esophageal cancer Neg Hx     Past Surgical History:  Procedure Laterality Date   BREAST LUMPECTOMY WITH RADIOACTIVE SEED LOCALIZATION Right 03/09/2020   Procedure: RIGHT BREAST LUMPECTOMY WITH RADIOACTIVE SEED LOCALIZATION;  Surgeon: Harriette Bouillon, MD;  Location: [facility];  Service: General;  Laterality: Right;   CARPAL TUNNEL RELEASE Right 09/26/2018   Procedure: RIGHT CARPAL TUNNEL RELEASE;  Surgeon: Tarry Kos, MD;  Location: [facility];  Service: Orthopedics;  Laterality: Right;   CARPAL TUNNEL RELEASE Left 2017   CESAREAN SECTION N/A 08/05/2014   Procedure: CESAREAN SECTION;  Surgeon: Marlow Baars, MD;  [facility];  Service: Obstetrics;  Laterality: N/A;   KNEE SURGERY Left 2000   LAPAROSCOPIC BILATERAL SALPINGECTOMY N/A 12/13/2015   Procedure: LAPAROSCOPIC BILATERAL SALPINGECTOMY;  Surgeon: Allie Bossier, MD;  [facility];  Service: Gynecology;  Laterality: N/A;   PERINEOPLASTY N/A 02/20/2016   Procedure: PERINEOPLASTY;  Surgeon: Allie Bossier, MD;  [facility];  Service: Gynecology;  Laterality: N/A;   PUBOVAGINAL  SLING N/A 12/13/2015   Procedure: Leonides Grills;  Surgeon: Allie Bossier, MD;  [facility];  Service: Gynecology;  Laterality: N/A;   RECTOCELE REPAIR N/A 02/20/2016   Procedure: POSTERIOR REPAIR (RECTOCELE);  Surgeon: Allie Bossier, MD;  [facility];  Service: Gynecology;  Laterality: N/A;   UMBILICAL HERNIA REPAIR  2011   Social History   Occupational History   Occupation: Dentist - part-time  Tobacco Use   Smoking status: Never   Smokeless tobacco: Never  Vaping Use   Vaping Use: Never used  Substance and Sexual Activity   Alcohol use: Not Currently   Drug use:  No   Sexual activity: Yes    Partners: Male    Birth control/protection: Surgical, I.U.D.

## 2023-04-04 ENCOUNTER — Ambulatory Visit (INDEPENDENT_AMBULATORY_CARE_PROVIDER_SITE_OTHER): Payer: Medicaid Other

## 2023-04-04 DIAGNOSIS — L209 Atopic dermatitis, unspecified: Secondary | ICD-10-CM | POA: Diagnosis not present

## 2023-04-05 NOTE — Progress Notes (Signed)
[facility] - Clinic Note  04/09/2023     CHIEF COMPLAINT Patient presents for Retina Follow Up   HISTORY OF PRESENT ILLNESS: Evelyn [name] is a 37 y.o. female who presents to the clinic today for:   HPI     Retina Follow Up   In both eyes.  This started 1 year ago.  Duration of 1 year.  Since onset it is stable.  I, the attending physician,  performed the HPI with the patient and updated documentation appropriately.        Comments   1 year ret follow up lattice pt is reporting that her vision is clear at times she denies any flashes or floaters       Last edited by Rennis Chris, MD on 04/13/2023  1:59 AM.    Patient states she is taking 200mg  Plaquenil 7 days a week, she stopped methotrexate and is now taking Arava states her vision gets blurry when she is tired  Referring physician: Gwenlyn Found, MD [id number] [place],  [place] [address]  HISTORICAL INFORMATION:   Selected notes from the MEDICAL RECORD NUMBER Referred by Dr. Laruth Bouchard for eval of lattice holes OU.   CURRENT MEDICATIONS: Current Outpatient Medications (Ophthalmic Drugs)  Medication Sig   Olopatadine HCl (PATADAY) 0.2 % SOLN Place 1 drop into both eyes daily as needed.   No current facility-administered medications for this visit. (Ophthalmic Drugs)   Current Outpatient Medications (Other)  Medication Sig   albuterol (VENTOLIN HFA) 108 (90 Base) MCG/ACT inhaler Inhale two puffs every 4-6 hours if needed for cough or wheeze.   Cholecalciferol (VITAMIN D) 50 MCG (2000 UT) CAPS Take by mouth daily.   dupilumab (DUPIXENT) 300 MG/2ML prefilled syringe Inject 300 mg into the skin every 14 (fourteen) days.   hydroxychloroquine (PLAQUENIL) 200 MG tablet Take 1 tablet by mouth twice daily Monday through Friday only.   levocetirizine (XYZAL) 5 MG tablet Take 1 tablet (5 mg total) by mouth every evening.   modafinil (PROVIGIL) 100 MG tablet Take 100 mg by mouth daily.    nitrofurantoin, macrocrystal-monohydrate, (MACROBID) 100 MG capsule Take 1 capsule (100 mg total) by mouth 2 (two) times daily.   nystatin ointment (MYCOSTATIN) Apply 1 Application topically 2 (two) times daily.   nystatin-triamcinolone ointment (MYCOLOG) Apply 1 Application topically 2 (two) times daily.   Olopatadine-Mometasone (RYALTRIS) [id number] MCG/ACT SUSP Apply 2 sprays each nostril twice a day as needed   RYVENT 6 MG TABS Take 6 mg by mouth in the morning and at bedtime.   triamcinolone (KENALOG) 0.1 % paste Place onto teeth.   triamcinolone ointment (KENALOG) 0.1 % Apply 1 Application topically 2 (two) times daily.   valACYclovir (VALTREX) 500 MG tablet Take 1 tablet (500 mg total) by mouth daily.   venlafaxine XR (EFFEXOR-XR) 75 MG 24 hr capsule Take 3 capsules (225 mg total) by mouth daily with breakfast.   famotidine (PEPCID) 20 MG tablet Take 1 tablet (20 mg total) by mouth 2 (two) times daily. (Patient not taking: Reported on 04/09/2023)   folic acid (FOLVITE) 1 MG tablet Take 1 tablet by mouth daily. (Patient not taking: Reported on 04/09/2023)   methotrexate (RHEUMATREX) 2.5 MG tablet Take 15 mg by mouth once a week. (Patient not taking: Reported on 04/09/2023)   traMADol (ULTRAM) 50 MG tablet Take 1 tablet (50 mg total) by mouth 3 (three) times daily as needed. (Patient not taking: Reported on 04/09/2023)  Current Facility-Administered Medications (Other)  Medication Route   dupilumab (DUPIXENT) prefilled syringe 300 mg Subcutaneous   REVIEW OF SYSTEMS: ROS   Positive for: Gastrointestinal, Musculoskeletal, Eyes Negative for: Constitutional, Neurological, Skin, Genitourinary, HENT, Endocrine, Cardiovascular, Respiratory, Psychiatric, Allergic/Imm, Heme/Lymph Last edited by Etheleen Mayhew, COT on 04/09/2023 12:54 PM.     ALLERGIES No Known Allergies  PAST MEDICAL HISTORY Past Medical History:  Diagnosis Date   Depression    post partum   GERD (gastroesophageal reflux  disease)    Systemic lupus erythematosus (HCC)    Umbilical hernia    Past Surgical History:  Procedure Laterality Date   BREAST LUMPECTOMY WITH RADIOACTIVE SEED LOCALIZATION Right 03/09/2020   Procedure: RIGHT BREAST LUMPECTOMY WITH RADIOACTIVE SEED LOCALIZATION;  Surgeon: Harriette Bouillon, MD;  Location: [facility];  Service: General;  Laterality: Right;   CARPAL TUNNEL RELEASE Right 09/26/2018   Procedure: RIGHT CARPAL TUNNEL RELEASE;  Surgeon: Tarry Kos, MD;  Location: [facility];  Service: Orthopedics;  Laterality: Right;   CARPAL TUNNEL RELEASE Left 2017   CESAREAN SECTION N/A 08/05/2014   Procedure: CESAREAN SECTION;  Surgeon: Marlow Baars, MD;  Location: [facility];  Service: Obstetrics;  Laterality: N/A;   KNEE SURGERY Left 2000   LAPAROSCOPIC BILATERAL SALPINGECTOMY N/A 12/13/2015   Procedure: LAPAROSCOPIC BILATERAL SALPINGECTOMY;  Surgeon: Allie Bossier, MD;  Location: [facility];  Service: Gynecology;  Laterality: N/A;   PERINEOPLASTY N/A 02/20/2016   Procedure: PERINEOPLASTY;  Surgeon: Allie Bossier, MD;  Location: [facility];  Service: Gynecology;  Laterality: N/A;   PUBOVAGINAL SLING N/A 12/13/2015   Procedure: Leonides Grills;  Surgeon: Allie Bossier, MD;  Location: [facility];  Service: Gynecology;  Laterality: N/A;   RECTOCELE REPAIR N/A 02/20/2016   Procedure: POSTERIOR REPAIR (RECTOCELE);  Surgeon: Allie Bossier, MD;  Location: [facility];  Service: Gynecology;  Laterality: N/A;   UMBILICAL HERNIA REPAIR  2011    FAMILY HISTORY Family History  Problem Relation Age of Onset   Healthy Mother    Other Father        never met her father - unsure of his history   Healthy Sister    Allergy (severe) Sister        sunblocks and mosquito repelent.   Healthy Brother    Healthy Daughter    Asthma Son    Healthy Son    Healthy Son    Healthy Son    Breast cancer Cousin 26       undergoing chemo/radiation   Heart disease Neg Hx    Neurologic Disorder Neg Hx    Colon  cancer Neg Hx    Esophageal cancer Neg Hx    SOCIAL HISTORY Social History   Tobacco Use   Smoking status: Never   Smokeless tobacco: Never  Vaping Use   Vaping Use: Never used  Substance Use Topics   Alcohol use: Not Currently   Drug use: No       OPHTHALMIC EXAM:  Base Eye Exam     Visual Acuity (Snellen - Linear)       Right Left   Dist cc 20/20 20/20    Correction: Glasses         Tonometry (Tonopen, 12:58 PM)       Right Left   Pressure 12 16         Pupils       Pupils Dark Light Shape React APD   Right PERRL 4 3  Round Brisk None   Left PERRL 4 3 Round Brisk None         Visual Fields       Left Right    Full Full         Extraocular Movement       Right Left    Full, Ortho Full, Ortho         Neuro/Psych     Oriented x3: Yes   Mood/Affect: Normal         Dilation     Both eyes: 2.5% Phenylephrine @ 12:58 PM           Slit Lamp and Fundus Exam     Slit Lamp Exam       Right Left   Lids/Lashes Mild Telangiectasia Mild Telangiectasia   Conjunctiva/Sclera White and quiet White and quiet   Cornea Clear Clear   Anterior Chamber deep and clear Deep and quiet   Iris Round and dilated Round and dilated   Lens Clear Clear   Anterior Vitreous Trace Vitreous syneresis, no cell Trace Vitreous syneresis, no cell         Fundus Exam       Right Left   Disc Pink and Sharp Pink and Sharp   C/D Ratio 0.3 0.3   Macula Flat, Good foveal reflex, No heme or edema Flat, Good foveal reflex, No heme or edema   Vessels Normal Mild copper wiring   Periphery Attached, temporal WWP, flap tear at 0930 ora, small patch of lattice with atrophic holes at 1000 -- good laser changes surrounding, mild patch of lattice at 0600 very anterior micro tear at 0700 ora -- light laser changes surrounding, patch of lattice at 1030-11 -- good laser in place around all lesions, No new RT/RD/lattice Attached, mild patch of pigmented lattice at 0430,  peripheral patch of lattice extending from 0500-0730, focal pigmented patch at 0730 equator -- excellent laser changes in place, No new RT/RD           Refraction     Wearing Rx       Sphere Cylinder Axis   Right -4.00 +1.00 103   Left -3.75 +1.00 073    Type: SVL            IMAGING AND PROCEDURES  OCT, Retina - OU - Both Eyes       Right Eye Quality was good. Central Foveal Thickness: 285. Progression has been stable. Findings include normal foveal contour, no IRF, no SRF, vitreomacular adhesion (No ellipsoid loss).   Left Eye Quality was good. Central Foveal Thickness: 285. Progression has been stable. Findings include normal foveal contour, no IRF, no SRF, vitreomacular adhesion (No ellipsoid loss).   Notes *Images captured and stored on drive  Diagnosis / Impression:  NFP, no IRF/SRF OU--stable VMA OU No ellipsoid loss or plaquenil toxicity OU  Clinical management:  See below  Abbreviations: NFP - Normal foveal profile. CME - cystoid macular edema. PED - pigment epithelial detachment. IRF - intraretinal fluid. SRF - subretinal fluid. EZ - ellipsoid zone. ERM - epiretinal membrane. ORA - outer retinal atrophy. ORT - outer retinal tubulation. SRHM - subretinal hyper-reflective material              ASSESSMENT/PLAN:    ICD-10-CM   1. 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 Bilateral retinal defect  H33.303     3. Myopia of both eyes with astigmatism  H52.13    H52.203     4. Long-term use of Plaquenil  Z79.899 OCT, Retina - OU - Both Eyes    5. Other systemic lupus erythematosus with other organ involvement (HCC)  M32.19      1,2. Lattice degeneration w/ retinal defects, both eyes  - right eye: flap tear at 0930 ora, small patch of pigmented lattice with atrophic holes at 1000, mild patch at 0600, very anterior micro tear at 0700 ora  - left eye: mild patch of pigmented lattice at 0430, peripheral patch  of lattice extending from 0500-0730, focal pigmented patch at 0730 equator  - s/p laser retinopexy OD (12.22.20), touch up OD (02.23.21) -- excellent laser in place             - s/p laser retinopexy OS (01.12.20) -- excellent laser changes in place  - no new RT/RD or lattice OU  - f/u 1 year -- DFE/OCT  3. Myopia with astigmatism OU  4,5. SLE on Plaquenil  - currently taking 200 mg Vita Barley  - pt is not longer taking methotrexate -- she is on Arava 10mg  instead  - no retinal toxicity noted on exam or OCT today  - baseline HVF 10-2 obtained 3.23.23 -- no significant field loss or defect OU  - pt's wt is now listed at 73 kg  - 200/73 = 2.73 mg/kg/day  - the AAO recommends daily dosing of < 5.0 mg/kg for HCQ -- dosing within range at this time   Ophthalmic Meds Ordered this visit:  No orders of the defined types were placed in this encounter.    Return in about 1 year (around 04/08/2024) for f/u plaquenil exam OU, DFE, OCT.  There are no Patient Instructions on file for this visit.   Explained the diagnoses, plan, and follow up with the patient and they expressed understanding.  Patient expressed understanding of the importance of proper follow up care.   This document serves as a record of services personally performed by Karie Chimera, MD, PhD. It was created on their behalf by Gerilyn Nestle, COT an ophthalmic technician. The creation of this record is the provider's dictation and/or activities during the visit.    Electronically signed by:  Gerilyn Nestle, COT  05.03.24 2:00 AM   This document serves as a record of services personally performed by Karie Chimera, MD, PhD. It was created on their behalf by Glee Arvin. Manson Passey, OA an ophthalmic technician. The creation of this record is the provider's dictation and/or activities during the visit.    Electronically signed by: Glee Arvin. Manson Passey, [place] 05.07.2024 2:00 AM  Karie Chimera, M.D., Ph.D. Diseases & Surgery of the Retina  and Vitreous [facility]  I have reviewed the above documentation for accuracy and completeness, and I agree with the above. Karie Chimera, M.D., Ph.D. 04/13/23 2:02 AM   Abbreviations: M myopia (nearsighted); A astigmatism; H hyperopia (farsighted); P presbyopia; Mrx spectacle prescription;  CTL contact lenses; OD right eye; OS left eye; OU both eyes  XT exotropia; ET esotropia; PEK punctate epithelial keratitis; PEE punctate epithelial erosions; DES dry eye syndrome; MGD meibomian gland dysfunction; ATs artificial tears; PFAT's preservative free artificial tears; NSC nuclear sclerotic cataract; PSC posterior subcapsular cataract; ERM epi-retinal membrane; PVD posterior vitreous detachment; RD retinal detachment; DM diabetes mellitus; DR diabetic retinopathy; NPDR non-proliferative diabetic retinopathy; PDR proliferative diabetic retinopathy; CSME clinically significant macular edema; DME diabetic macular edema; dbh dot blot hemorrhages; CWS cotton  wool spot; POAG primary open angle glaucoma; C/D cup-to-disc ratio; HVF humphrey visual field; GVF goldmann visual field; OCT optical coherence tomography; IOP intraocular pressure; BRVO Branch retinal vein occlusion; CRVO central retinal vein occlusion; CRAO central retinal artery occlusion; BRAO branch retinal artery occlusion; RT retinal tear; SB scleral buckle; PPV pars plana vitrectomy; VH Vitreous hemorrhage; PRP panretinal laser photocoagulation; IVK intravitreal kenalog; VMT vitreomacular traction; MH Macular hole;  NVD neovascularization of the disc; NVE neovascularization elsewhere; AREDS age related eye disease study; ARMD age related macular degeneration; POAG primary open angle glaucoma; EBMD epithelial/anterior basement membrane dystrophy; ACIOL anterior chamber intraocular lens; IOL intraocular lens; PCIOL posterior chamber intraocular lens; Phaco/IOL phacoemulsification with intraocular lens placement; PRK photorefractive  keratectomy; LASIK laser assisted in situ keratomileusis; HTN hypertension; DM diabetes mellitus; COPD chronic obstructive pulmonary disease

## 2023-04-09 ENCOUNTER — Encounter (INDEPENDENT_AMBULATORY_CARE_PROVIDER_SITE_OTHER): Payer: Self-pay | Admitting: Ophthalmology

## 2023-04-09 ENCOUNTER — Ambulatory Visit (INDEPENDENT_AMBULATORY_CARE_PROVIDER_SITE_OTHER): Payer: Medicaid Other | Admitting: Ophthalmology

## 2023-04-09 DIAGNOSIS — H33303 Unspecified retinal break, bilateral: Secondary | ICD-10-CM | POA: Diagnosis not present

## 2023-04-09 DIAGNOSIS — H5213 Myopia, bilateral: Secondary | ICD-10-CM

## 2023-04-09 DIAGNOSIS — H35413 Lattice degeneration of retina, bilateral: Secondary | ICD-10-CM

## 2023-04-09 DIAGNOSIS — M3219 Other organ or system involvement in systemic lupus erythematosus: Secondary | ICD-10-CM

## 2023-04-09 DIAGNOSIS — Z79899 Other long term (current) drug therapy: Secondary | ICD-10-CM

## 2023-04-10 ENCOUNTER — Telehealth: Payer: Self-pay | Admitting: Physician Assistant

## 2023-04-10 ENCOUNTER — Other Ambulatory Visit (HOSPITAL_COMMUNITY): Payer: Self-pay

## 2023-04-10 ENCOUNTER — Other Ambulatory Visit: Payer: Self-pay

## 2023-04-10 NOTE — Telephone Encounter (Signed)
Do we have an update on the Gel injection approval, please advise

## 2023-04-10 NOTE — Telephone Encounter (Signed)
FYI- Called and left a VM advising patient that Medicaid does not cover gel injections at all.  Advised of TriVisc and to give me a call back if she would like to proceed.  Please advise on next option for patient.  Thank you.

## 2023-04-11 NOTE — Telephone Encounter (Signed)
Trivisc is fine with me as long as she's good with it

## 2023-04-12 ENCOUNTER — Other Ambulatory Visit: Payer: Self-pay

## 2023-04-12 NOTE — Telephone Encounter (Signed)
Noted.  Will discuss about TriVisc if patient chooses to proceed.

## 2023-04-13 ENCOUNTER — Encounter (INDEPENDENT_AMBULATORY_CARE_PROVIDER_SITE_OTHER): Payer: Self-pay | Admitting: Ophthalmology

## 2023-04-15 ENCOUNTER — Other Ambulatory Visit (HOSPITAL_COMMUNITY): Payer: Self-pay

## 2023-04-16 ENCOUNTER — Other Ambulatory Visit (HOSPITAL_COMMUNITY): Payer: Self-pay

## 2023-04-17 ENCOUNTER — Other Ambulatory Visit (HOSPITAL_COMMUNITY): Payer: Self-pay

## 2023-04-17 ENCOUNTER — Other Ambulatory Visit: Payer: Self-pay

## 2023-04-22 ENCOUNTER — Ambulatory Visit (INDEPENDENT_AMBULATORY_CARE_PROVIDER_SITE_OTHER): Payer: Medicaid Other | Admitting: *Deleted

## 2023-04-22 DIAGNOSIS — L209 Atopic dermatitis, unspecified: Secondary | ICD-10-CM

## 2023-05-08 ENCOUNTER — Ambulatory Visit (INDEPENDENT_AMBULATORY_CARE_PROVIDER_SITE_OTHER): Payer: Medicaid Other | Admitting: *Deleted

## 2023-05-08 DIAGNOSIS — L209 Atopic dermatitis, unspecified: Secondary | ICD-10-CM

## 2023-05-09 ENCOUNTER — Other Ambulatory Visit (HOSPITAL_COMMUNITY): Payer: Self-pay

## 2023-05-13 ENCOUNTER — Other Ambulatory Visit (HOSPITAL_COMMUNITY): Payer: Self-pay

## 2023-05-15 ENCOUNTER — Other Ambulatory Visit (HOSPITAL_COMMUNITY): Payer: Self-pay

## 2023-05-15 ENCOUNTER — Ambulatory Visit: Payer: Medicaid Other | Admitting: Allergy

## 2023-05-15 ENCOUNTER — Other Ambulatory Visit: Payer: Self-pay

## 2023-05-15 ENCOUNTER — Encounter: Payer: Self-pay | Admitting: Allergy

## 2023-05-15 VITALS — BP 110/80 | HR 60 | Temp 98.4°F | Resp 20 | Ht 61.0 in | Wt 150.8 lb

## 2023-05-15 DIAGNOSIS — J310 Chronic rhinitis: Secondary | ICD-10-CM | POA: Diagnosis not present

## 2023-05-15 DIAGNOSIS — L509 Urticaria, unspecified: Secondary | ICD-10-CM | POA: Diagnosis not present

## 2023-05-15 DIAGNOSIS — H1013 Acute atopic conjunctivitis, bilateral: Secondary | ICD-10-CM

## 2023-05-15 DIAGNOSIS — T781XXD Other adverse food reactions, not elsewhere classified, subsequent encounter: Secondary | ICD-10-CM

## 2023-05-15 DIAGNOSIS — J4520 Mild intermittent asthma, uncomplicated: Secondary | ICD-10-CM | POA: Diagnosis not present

## 2023-05-15 DIAGNOSIS — H109 Unspecified conjunctivitis: Secondary | ICD-10-CM

## 2023-05-15 DIAGNOSIS — L2084 Intrinsic (allergic) eczema: Secondary | ICD-10-CM

## 2023-05-15 MED ORDER — ALBUTEROL SULFATE HFA 108 (90 BASE) MCG/ACT IN AERS: INHALATION_SPRAY | RESPIRATORY_TRACT | 1 refills | Status: DC

## 2023-05-15 MED ORDER — IPRATROPIUM BROMIDE 0.06 % NA SOLN: NASAL | 5 refills | Status: DC

## 2023-05-15 MED ORDER — RYVENT 6 MG PO TABS: 6.0000 mg | ORAL_TABLET | Freq: Two times a day (BID) | ORAL | 5 refills | Status: DC

## 2023-05-15 MED ORDER — OLOPATADINE HCL 0.2 % OP SOLN: OPHTHALMIC | 5 refills | Status: AC

## 2023-05-15 NOTE — Patient Instructions (Addendum)
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 Hives are intermittent and can be very itchy.  Physical features can worsen hives like pressure and change in body temperature.   labs were reassuring.   -for management of hives continue Ryvent 6mg  twice a day dosing   - for blistering rash (improved on dupixent) on face continue use of Tacrolimus as needed.    - continue Dupixent injections every 2 weeks at this time  Adverse food reaction -continue avoidance of dairy and mushrooms -IgE levels are negative thus if you would like to eat these foods can perform in-office food challenges.   Environmental allergies -Ryvent as above -for itchy/watery eyes can use Pataday 1 drop each eye daily as needed -for nasal congestion/drainage use nasal Atrovent 2 sprays each nostril up to 3-4 times a day as needed   Reactive airway -symptoms relieved with albuterol use -have access to albuterol inhaler 2 puffs every 4-6 hours as needed for cough/wheeze/shortness of breath/chest tightness.  Use 15-20 minutes prior to activity.   Monitor frequency of use.    Breathing control goals:  Full participation in all desired activities (may need albuterol before activity) Albuterol use two time or less a week on average (not counting use with activity) Cough interfering with sleep two time or less a month Oral steroids no more than once a year No hospitalizations  Follow-up in 6 months or sooner if needed

## 2023-05-15 NOTE — Progress Notes (Signed)
Follow-up Note  RE: Evelyn [name] MRN: [id number] DOB: February 19, 1986 Date of Office Visit: 05/15/2023   History of present illness: Evelyn [name] is a 37 y.o. female presenting today for follow-up of dermatitis, allergic rhinitis, reactive airway, adverse food reaction.  She was last seen in the office on 12/13/22 by myself.  She has not had any major health changes, surgeries or hospitalizations since the last visit.  She states she has been doing okay. She continues on dupixent injections with last on 05/08/23.  She is tolerating injections well without large local or systemic reactions.  She has not had any further outbreaks of the blistering rash she has had previously before starting on Dupixent.  Dupixent has helped to control this.  She also states she has not had the same degree of hive like rash as she used to.  It definitely has become less frequent.  She also states she has not really needed to use the tacrolimus ointment like she was previously. In regards to her allergies she does state that she has runny nose, dry throat and dry eye.  The Ryaltris nasal spray did help when she had to use it however the company who provides the spray reached out to her regarding insurance coverage and states they never reached out again and she did not hear any more from them.  Thus she does not have the spray anymore.  She does continue on RyVent and states it is covered and she does feel like her symptoms are less with this change to RyVent versus the Allegra. She does use albuterol prior to activity like long walks or exercises.  She has not needed to use albuterol as a rescue medication if she uses as a pretreatment.   Review of systems: Review of Systems  Constitutional: Negative.   HENT:         See HPI  Eyes: Negative.   Respiratory: Negative.    Cardiovascular: Negative.   Gastrointestinal: Negative.   Musculoskeletal: Negative.   Skin: Negative.   Allergic/Immunologic:  Negative.   Neurological: Negative.      All other systems negative unless noted above in HPI  Past medical/social/surgical/family history have been reviewed and are unchanged unless specifically indicated below.  No changes  Medication List: Current Outpatient Medications  Medication Sig Dispense Refill   Cholecalciferol (VITAMIN D) 50 MCG (2000 UT) CAPS Take by mouth daily.     dupilumab (DUPIXENT) 300 MG/2ML prefilled syringe Inject 300 mg into the skin every 14 (fourteen) days. 4 mL 11   hydroxychloroquine (PLAQUENIL) 200 MG tablet Take 1 tablet by mouth twice daily Monday through Friday only. 120 tablet 0   ipratropium (ATROVENT) 0.06 % nasal spray 2 sprays each nostril up to 3-4 times a day as needed for nasal congestion/drainage. 15 mL 5   levocetirizine (XYZAL) 5 MG tablet Take 1 tablet (5 mg total) by mouth every evening. 30 tablet 5   modafinil (PROVIGIL) 100 MG tablet Take 100 mg by mouth daily.     nystatin ointment (MYCOSTATIN) Apply 1 Application topically 2 (two) times daily. 30 g 0   nystatin-triamcinolone ointment (MYCOLOG) Apply 1 Application topically 2 (two) times daily. 30 g 0   Olopatadine HCl (PATADAY) 0.2 % SOLN Place 1 drop into both eyes daily as needed. 2.5 mL 5   Olopatadine HCl (PATADAY) 0.2 % SOLN 1 drop each eye daily as needed. 2.5 mL 5   triamcinolone (KENALOG) 0.1 % paste Place onto teeth.  triamcinolone ointment (KENALOG) 0.1 % Apply 1 Application topically 2 (two) times daily. 30 g 0   valACYclovir (VALTREX) 500 MG tablet Take 1 tablet (500 mg total) by mouth daily. 30 tablet 12   venlafaxine XR (EFFEXOR-XR) 75 MG 24 hr capsule Take 3 capsules (225 mg total) by mouth daily with breakfast. 90 capsule 12   albuterol (VENTOLIN HFA) 108 (90 Base) MCG/ACT inhaler Inhale two puffs every 4-6 hours if needed for cough or wheeze. 18 g 1   famotidine (PEPCID) 20 MG tablet Take 1 tablet (20 mg total) by mouth 2 (two) times daily. (Patient not taking: Reported  on 04/09/2023) 60 tablet 5   folic acid (FOLVITE) 1 MG tablet Take 1 tablet by mouth daily. (Patient not taking: Reported on 04/09/2023)     methotrexate (RHEUMATREX) 2.5 MG tablet Take 15 mg by mouth once a week. (Patient not taking: Reported on 04/09/2023)     Olopatadine-Mometasone (RYALTRIS) 665-25 MCG/ACT SUSP Apply 2 sprays each nostril twice a day as needed (Patient not taking: Reported on 05/15/2023) 29 g 5   RYVENT 6 MG TABS Take 1 tablet (6 mg total) by mouth in the morning and at bedtime. 60 tablet 5   traMADol (ULTRAM) 50 MG tablet Take 1 tablet (50 mg total) by mouth 3 (three) times daily as needed. (Patient not taking: Reported on 04/09/2023) 30 tablet 2   Current Facility-Administered Medications  Medication Dose Route Frequency Provider Last Rate Last Admin   dupilumab (DUPIXENT) prefilled syringe 300 mg  300 mg Subcutaneous Q14 Days Marcelyn Bruins, MD   300 mg at 05/08/23 1033     Known medication allergies: No Known Allergies   Physical examination: Blood pressure 110/80, pulse 60, temperature 98.4 F (36.9 C), resp. rate 20, height 5\' 1"  (1.549 m), weight 150 lb 12.8 oz (68.4 kg), SpO2 95 %.  General: Alert, interactive, in no acute distress. HEENT: PERRLA, TMs pearly gray, turbinates non-edematous with clear discharge, post-pharynx non erythematous. Neck: Supple without lymphadenopathy. Lungs: Clear to auscultation without wheezing, rhonchi or rales. {no increased work of breathing. CV: Normal S1, S2 without murmurs. Abdomen: Nondistended, nontender. Skin: Warm and dry, without lesions or rashes. Extremities:  No clubbing, cyanosis or edema. Neuro:   Grossly intact.  Diagnositics/Labs: None today   Assessment and plan: Dermatitis--1) eczematous/blistering; 2) urticarial  -description and appearance of the rash on body appears to be a variant of hives- cholinergic hives. Hives are intermittent and can be very itchy.  Physical features can worsen hives like  pressure and change in body temperature.   labs were reassuring.   -for management of hives continue Ryvent 6mg  twice a day dosing   - for blistering rash (improved on dupixent) on face continue use of Tacrolimus as needed.    - continue Dupixent injections every 2 weeks at this time  Adverse food reaction -continue avoidance of dairy and mushrooms -IgE levels are negative thus if you would like to eat these foods can perform in-office food challenges.   Environmental allergies -Ryvent as above -for itchy/watery eyes can use Pataday 1 drop each eye daily as needed -for nasal congestion/drainage use nasal Atrovent 2 sprays each nostril up to 3-4 times a day as needed   Reactive airway -symptoms relieved with albuterol use -have access to albuterol inhaler 2 puffs every 4-6 hours as needed for cough/wheeze/shortness of breath/chest tightness.  Use 15-20 minutes prior to activity.   Monitor frequency of use.    Breathing control goals:  Full participation in all desired activities (may need albuterol before activity) Albuterol use two time or less a week on average (not counting use with activity) Cough interfering with sleep two time or less a month Oral steroids no more than once a year No hospitalizations  Follow-up in 6 months or sooner if needed  I appreciate the opportunity to take part in Deara's care. Please do not hesitate to contact me with questions.  Sincerely,   Margo Aye, MD Allergy/Immunology [facility]

## 2023-05-16 ENCOUNTER — Other Ambulatory Visit (HOSPITAL_COMMUNITY): Payer: Self-pay

## 2023-05-22 ENCOUNTER — Ambulatory Visit: Payer: Medicaid Other

## 2023-06-11 ENCOUNTER — Ambulatory Visit (INDEPENDENT_AMBULATORY_CARE_PROVIDER_SITE_OTHER): Payer: Medicaid Other | Admitting: *Deleted

## 2023-06-11 DIAGNOSIS — L209 Atopic dermatitis, unspecified: Secondary | ICD-10-CM

## 2023-06-14 ENCOUNTER — Other Ambulatory Visit (HOSPITAL_COMMUNITY): Payer: Self-pay

## 2023-06-17 ENCOUNTER — Telehealth: Payer: Self-pay

## 2023-06-17 ENCOUNTER — Encounter (HOSPITAL_COMMUNITY): Payer: Self-pay

## 2023-06-17 ENCOUNTER — Other Ambulatory Visit (HOSPITAL_COMMUNITY): Payer: Self-pay

## 2023-06-17 NOTE — Telephone Encounter (Signed)
Pharmacy Patient Advocate Encounter   Received notification from CoverMyMeds that prior authorization for RyVent 6MG  tablets is required/requested.   Insurance verification completed.   The patient is insured through [facility] [place] .   Per test claim: PA submitted to [facility] Medicaid via CoverMyMeds Key/confirmation #/EOC Key: [id number] Status is pending

## 2023-06-18 NOTE — Telephone Encounter (Signed)
 Lm for pt to call us back about this °

## 2023-06-18 NOTE — Telephone Encounter (Signed)
Pt has to try and fail Carbinoxamine solution, or Cyproheptadine syrup/tablet, or Hydroxyzine capsule/solution/tablet

## 2023-06-18 NOTE — Telephone Encounter (Signed)
Pharmacy Patient Advocate Encounter  Received notification from [facility] Medicaid that Prior Authorization for RyVent 6MG  tablets has been DENIED because per the health plan's preferred drug list, at least 2 preferred rugs must be tried before requesting this drug or tell us why the member cannot try andy preferred alternatives. Here is a list of preferred alternatives: carbinoxamine solution, cyproheptadine syrup, cyproheptadine tablet, hydroxyzine capsule, hydroxyzine solution, hydroxyzine tablet..  PA #/Case ID/Reference #:  [id number] Key: [id number]  Please be advised we currently do not have a Pharmacist to review denials, therefore you will need to process appeals accordingly as needed. Thanks for your support at this time. Contact for appeals are as follows: Phone: [phone], Fax: [phone] Appeal must be received by 08-16-2023  Further information is in denial letter attached to patient chart

## 2023-06-19 ENCOUNTER — Telehealth: Payer: Self-pay | Admitting: Allergy

## 2023-06-19 MED ORDER — CARBINOXAMINE MALEATE 4 MG/5ML PO SOLN: 10.0000 mL | Freq: Two times a day (BID) | ORAL | 2 refills | Status: DC

## 2023-06-19 NOTE — Addendum Note (Signed)
Addended by: Robet Leu A on: 06/19/2023 12:25 PM   Modules accepted: Orders

## 2023-06-19 NOTE — Telephone Encounter (Signed)
Pt was returning a call back she received from our office, I advised patient of notes from [place] per Dr. Delorse Lek, patient verbalized understanding and had no further questions. She is aware there is an rx at the pharmacy for pick up.

## 2023-06-19 NOTE — Telephone Encounter (Addendum)
Called and left a message for patient to call our office back to discuss the note per Dr. Delorse Lek.  I will also contact patient via mychart. I have sent in the [facility]binoxamine solution to the [facility] on [address].

## 2023-06-25 ENCOUNTER — Ambulatory Visit (INDEPENDENT_AMBULATORY_CARE_PROVIDER_SITE_OTHER): Payer: Medicaid Other | Admitting: *Deleted

## 2023-06-25 DIAGNOSIS — L209 Atopic dermatitis, unspecified: Secondary | ICD-10-CM | POA: Diagnosis not present

## 2023-07-11 ENCOUNTER — Ambulatory Visit (INDEPENDENT_AMBULATORY_CARE_PROVIDER_SITE_OTHER): Payer: Medicaid Other

## 2023-07-11 ENCOUNTER — Other Ambulatory Visit (HOSPITAL_COMMUNITY): Payer: Self-pay

## 2023-07-11 DIAGNOSIS — L209 Atopic dermatitis, unspecified: Secondary | ICD-10-CM

## 2023-07-16 ENCOUNTER — Other Ambulatory Visit: Payer: Self-pay

## 2023-07-28 ENCOUNTER — Other Ambulatory Visit: Payer: Self-pay | Admitting: Allergy

## 2023-07-29 ENCOUNTER — Ambulatory Visit: Payer: Medicaid Other

## 2023-08-06 ENCOUNTER — Other Ambulatory Visit (HOSPITAL_COMMUNITY): Payer: Self-pay

## 2023-08-07 ENCOUNTER — Other Ambulatory Visit (HOSPITAL_COMMUNITY): Payer: Self-pay

## 2023-08-09 ENCOUNTER — Other Ambulatory Visit: Payer: Self-pay

## 2023-08-13 ENCOUNTER — Other Ambulatory Visit: Payer: Self-pay

## 2023-08-15 ENCOUNTER — Other Ambulatory Visit (HOSPITAL_COMMUNITY): Payer: Self-pay

## 2023-08-16 ENCOUNTER — Ambulatory Visit (INDEPENDENT_AMBULATORY_CARE_PROVIDER_SITE_OTHER): Payer: Medicaid Other

## 2023-08-16 DIAGNOSIS — L209 Atopic dermatitis, unspecified: Secondary | ICD-10-CM

## 2023-08-30 ENCOUNTER — Ambulatory Visit: Payer: Medicaid Other

## 2023-09-18 ENCOUNTER — Other Ambulatory Visit: Payer: Self-pay

## 2023-10-08 ENCOUNTER — Other Ambulatory Visit: Payer: Self-pay

## 2023-10-11 ENCOUNTER — Ambulatory Visit: Payer: Medicaid Other

## 2023-10-11 DIAGNOSIS — L209 Atopic dermatitis, unspecified: Secondary | ICD-10-CM

## 2023-10-24 ENCOUNTER — Other Ambulatory Visit: Payer: Self-pay | Admitting: Allergy

## 2023-10-24 ENCOUNTER — Other Ambulatory Visit: Payer: Self-pay

## 2023-10-24 MED ORDER — DUPIXENT 300 MG/2ML ~~LOC~~ SOSY
300.0000 mg | PREFILLED_SYRINGE | SUBCUTANEOUS | 11 refills | Status: DC
  Filled 2023-10-24: qty 4, 28d supply, fill #0
  Filled 2023-11-28: qty 4, 28d supply, fill #1
  Filled 2024-01-02: qty 4, 28d supply, fill #2
  Filled 2024-02-04: qty 4, 28d supply, fill #3
  Filled 2024-03-20: qty 4, 28d supply, fill #4
  Filled 2024-04-20: qty 4, 28d supply, fill #5
  Filled 2024-05-27: qty 4, 28d supply, fill #6
  Filled 2024-07-06 – 2024-09-24 (×2): qty 4, 28d supply, fill #7
  Filled 2024-10-19: qty 4, 28d supply, fill #8

## 2023-10-24 NOTE — Progress Notes (Signed)
Specialty Pharmacy Refill Coordination Note  Evelyn [name] is a 37 y.o. female contacted today regarding refills of specialty medication(s) Dupilumab  Patient has switched to injecting at home.   Patient requested Delivery   Delivery date: 11/05/23   Verified address: [address]  [name] [address]   Medication will be filled on 11/04/23.

## 2023-10-25 ENCOUNTER — Other Ambulatory Visit: Payer: Self-pay

## 2023-11-04 ENCOUNTER — Other Ambulatory Visit: Payer: Self-pay

## 2023-11-14 ENCOUNTER — Ambulatory Visit: Payer: Medicaid Other | Admitting: Allergy

## 2023-11-18 ENCOUNTER — Other Ambulatory Visit: Payer: Self-pay

## 2023-11-18 ENCOUNTER — Encounter: Payer: Self-pay | Admitting: Family Medicine

## 2023-11-18 ENCOUNTER — Ambulatory Visit (INDEPENDENT_AMBULATORY_CARE_PROVIDER_SITE_OTHER): Payer: Medicaid Other | Admitting: Family Medicine

## 2023-11-18 VITALS — BP 102/60 | HR 58 | Temp 98.3°F | Resp 20 | Ht 61.61 in | Wt 152.7 lb

## 2023-11-18 DIAGNOSIS — T7819XA Other adverse food reactions, not elsewhere classified, initial encounter: Secondary | ICD-10-CM | POA: Insufficient documentation

## 2023-11-18 DIAGNOSIS — T781XXD Other adverse food reactions, not elsewhere classified, subsequent encounter: Secondary | ICD-10-CM

## 2023-11-18 DIAGNOSIS — J4520 Mild intermittent asthma, uncomplicated: Secondary | ICD-10-CM | POA: Diagnosis not present

## 2023-11-18 DIAGNOSIS — J310 Chronic rhinitis: Secondary | ICD-10-CM

## 2023-11-18 DIAGNOSIS — L2084 Intrinsic (allergic) eczema: Secondary | ICD-10-CM

## 2023-11-18 DIAGNOSIS — J45909 Unspecified asthma, uncomplicated: Secondary | ICD-10-CM | POA: Insufficient documentation

## 2023-11-18 DIAGNOSIS — L508 Other urticaria: Secondary | ICD-10-CM

## 2023-11-18 DIAGNOSIS — T781XXA Other adverse food reactions, not elsewhere classified, initial encounter: Secondary | ICD-10-CM | POA: Insufficient documentation

## 2023-11-18 MED ORDER — EPINEPHRINE 0.3 MG/0.3ML IJ SOAJ: INTRAMUSCULAR | 1 refills | Status: DC

## 2023-11-18 NOTE — Patient Instructions (Signed)
Reactive airway disease Continue albuterol 2 puffs once every 4 hours if needed for cough or wheeze You may use albuterol 2 puffs 5 to 15 minutes before activity to decrease cough or wheeze  Chronic rhinitis Continue levocetirizine 5 mg once a day as needed for runny nose or itch Continue Ryaltris 2 sprays in each nostril up to twice a day as needed for nasal symptoms Consider saline nasal rinses as needed for nasal symptoms. Use this before any medicated nasal sprays for best result  Urticaria If your symptoms re-occur, begin a journal of events that occurred for up to 6 hours before your symptoms began including foods and beverages consumed, soaps or perfumes you had contact with, and medications.   Atopic dermatitis Continue a twice a day moisturizing routine Continue tacrolimus to red and itchy areas up to twice a day as needed Continue Dupixent injections 300 mg once every 2 weeks  Food allergy Continue to avoid dairy and mushrooms. In case of an allergic reaction, take Benadryl 50 mg  every 4 hours, and if life-threatening symptoms occur, inject with EpiPen 0.3 mg.  Call the clinic if this treatment plan is not working well for you.  Follow up in 6 months or sooner if needed.

## 2023-11-18 NOTE — Progress Notes (Signed)
[address] [place] [address] Dept: [phone]  FOLLOW UP NOTE  Patient ID: Evelyn [name], female    DOB: 1986/03/26  Age: 37 y.o. MRN: [id number] Date of Office Visit: 11/18/2023  Assessment  Chief Complaint: Follow-up  HPI Evelyn [name] is a 37 year old female who presents to the clinic for follow-up visit.  She was last seen in this clinic on 05/15/2023 by Dr. Delorse Lek for evaluation of urticaria, atopic dermatitis, chronic rhinitis, reactive airway disease, and possible food allergy to nuts, dairy, and mushroom.  At today's visit, she reports her breathing has been well-controlled with occasional shortness of breath with activity for which she uses albuterol before activity 3 days a week with relief of symptoms.  She denies shortness of breath at rest.  She denies wheezing or coughing with activity or rest.  Allergic rhinitis is reported as moderately well-controlled with occasional postnasal congestion and rhinorrhea as the main symptoms.  She reports these generally occur in the spring and winter season especially with change of weather.  She continues levocetirizine 5 mg daily and occasionally uses Ryaltris mainly in the spring.  Last environmental allergy testing via lab on 11/10/2021 was negative to the adult environmental panel.  Atopic dermatitis is reported as moderately well-controlled with occasional red and itchy areas occurring in a flare in remission pattern mainly on her face.  She reports occasionally experiencing raised, red, and itchy areas on her face.  She continues a daily moisturizing routine with Aveeno and continues Dupixent injections once every 2 weeks.  She denies any large or local reactions with Dupixent injections.  She reports a significant decrease in her symptoms of atopic dermatitis while continuing on Dupixent injections.  She continues to avoid dairy and mushrooms with no accidental ingestion or EpiPen use since her last visit to this  clinic.  She reports that mushrooms especially make her mouth itchy and her throat swell.  An epinephrine autoinjector set has been ordered at today's visit.  Her last food allergy testing via lab indicated negative milk IgE and negative mushroom IgE.  Her current medications are listed in the chart.  Drug Allergies:  No Known Allergies  Physical Exam: BP 102/60   Pulse (!) 58   Temp 98.3 F (36.8 C) (Temporal)   Resp 20   Ht 5' 1.61" (1.565 m)   Wt 152 lb 11.2 oz (69.3 kg)   SpO2 100%   BMI 28.28 kg/m    Physical Exam Vitals reviewed.  Constitutional:      Appearance: Normal appearance.  HENT:     Head: Normocephalic and atraumatic.     Right Ear: Tympanic membrane normal.     Left Ear: Tympanic membrane normal.     Nose:     Comments: Bilateral naris slightly erythematous with thin clear nasal drainage noted.  Pharynx slightly erythematous with no exudate.  Ears normal.  Eyes normal.    Mouth/Throat:     Pharynx: Oropharynx is clear.  Eyes:     Conjunctiva/sclera: Conjunctivae normal.  Cardiovascular:     Rate and Rhythm: Normal rate and regular rhythm.     Heart sounds: Normal heart sounds. No murmur heard. Pulmonary:     Effort: Pulmonary effort is normal.     Breath sounds: Normal breath sounds.     Comments: Lungs clear to auscultation Musculoskeletal:        General: Normal range of motion.     Cervical back: Normal range of motion and neck supple.  Skin:    General: Skin is warm and dry.  Neurological:     Mental Status: She is alert and oriented to person, place, and time.  Psychiatric:        Mood and Affect: Mood normal.        Behavior: Behavior normal.        Thought Content: Thought content normal.        Judgment: Judgment normal.    Assessment and Plan: 1. Intrinsic atopic dermatitis   2. Mild intermittent reactive airway disease without complication   3. Adverse food reaction, subsequent encounter   4. Chronic rhinitis   5. Chronic urticaria      Meds ordered this encounter  Medications   EPINEPHrine 0.3 mg/0.3 mL IJ SOAJ injection    Sig: As needed for life-threatening allergic reactions    Dispense:  1 each    Refill:  1    Patient will call when needed.  Thank you    Patient Instructions  Reactive airway disease Continue albuterol 2 puffs once every 4 hours if needed for cough or wheeze You may use albuterol 2 puffs 5 to 15 minutes before activity to decrease cough or wheeze  Chronic rhinitis Continue levocetirizine 5 mg once a day as needed for runny nose or itch Continue Ryaltris 2 sprays in each nostril up to twice a day as needed for nasal symptoms Consider saline nasal rinses as needed for nasal symptoms. Use this before any medicated nasal sprays for best result  Urticaria If your symptoms re-occur, begin a journal of events that occurred for up to 6 hours before your symptoms began including foods and beverages consumed, soaps or perfumes you had contact with, and medications.   Atopic dermatitis Continue a twice a day moisturizing routine Continue tacrolimus to red and itchy areas up to twice a day as needed Continue Dupixent injections 300 mg once every 2 weeks  Food allergy Continue to avoid dairy and mushrooms. In case of an allergic reaction, take Benadryl 50 mg  every 4 hours, and if life-threatening symptoms occur, inject with EpiPen 0.3 mg.  Call the clinic if this treatment plan is not working well for you.  Follow up in 6 months or sooner if needed.   Return in about 6 months (around 05/18/2024), or if symptoms worsen or fail to improve.    Thank you for the opportunity to care for this patient.  Please do not hesitate to contact me with questions.  Thermon Leyland, FNP [facility]

## 2023-11-22 ENCOUNTER — Other Ambulatory Visit: Payer: Self-pay

## 2023-11-25 ENCOUNTER — Other Ambulatory Visit: Payer: Self-pay

## 2023-11-28 ENCOUNTER — Other Ambulatory Visit: Payer: Self-pay

## 2023-11-28 NOTE — Progress Notes (Signed)
Specialty Pharmacy Refill Coordination Note  Evelyn [name] is a 37 y.o. female contacted today regarding refills of specialty medication(s) Dupilumab (Dupixent)   Patient requested Delivery   Delivery date: 12/10/23   Verified address: [address]  [place] [address]   Medication will be filled on 12/09/23.

## 2023-12-09 ENCOUNTER — Other Ambulatory Visit: Payer: Self-pay

## 2023-12-17 ENCOUNTER — Other Ambulatory Visit: Payer: Self-pay

## 2024-01-02 ENCOUNTER — Other Ambulatory Visit: Payer: Self-pay

## 2024-01-02 NOTE — Progress Notes (Signed)
Specialty Pharmacy Refill Coordination Note  Evelyn [name] is a 38 y.o. female contacted today regarding refills of specialty medication(s) Dupilumab (Dupixent)   Patient requested Delivery   Delivery date: 01/09/24   Verified address: [address]  [place] [address]   Medication will be filled on 01/08/24.

## 2024-01-08 ENCOUNTER — Other Ambulatory Visit: Payer: Self-pay

## 2024-01-29 ENCOUNTER — Other Ambulatory Visit: Payer: Self-pay

## 2024-01-31 ENCOUNTER — Other Ambulatory Visit: Payer: Self-pay

## 2024-02-04 ENCOUNTER — Other Ambulatory Visit (HOSPITAL_COMMUNITY): Payer: Self-pay

## 2024-02-04 NOTE — Progress Notes (Signed)
 Specialty Pharmacy Refill Coordination Note  Evelyn [name] is a 38 y.o. female contacted today regarding refills of specialty medication(s) Dupilumab (Dupixent)   Patient requested Delivery   Delivery date: 02/19/24   Verified address: [address] [place] [address]   Medication will be filled on 02/18/24.

## 2024-02-04 NOTE — Progress Notes (Signed)
 Specialty Pharmacy Ongoing Clinical Assessment Note  Evelyn [name] is a 38 y.o. female who is being followed by the specialty pharmacy service for RxSp Atopic Dermatitis   Patient's specialty medication(s) reviewed today: Dupilumab (Dupixent)   Missed doses in the last 4 weeks: 0   Patient/Caregiver did not have any additional questions or concerns.   Therapeutic benefit summary: Patient is achieving benefit   Adverse events/side effects summary: No adverse events/side effects   Patient's therapy is appropriate to: Continue    Goals Addressed             This Visit's Progress    Minimize recurrence of flares       Patient is on track. Patient will maintain adherence.  Patient reports that she remains well-controlled on therapy.         Follow up:  6 months  Servando Snare Specialty Pharmacist

## 2024-02-07 ENCOUNTER — Other Ambulatory Visit: Payer: Self-pay

## 2024-02-07 MED ORDER — LEVOCETIRIZINE DIHYDROCHLORIDE 5 MG PO TABS: ORAL_TABLET | ORAL | 1 refills | Status: DC

## 2024-03-05 ENCOUNTER — Ambulatory Visit: Admitting: Sports Medicine

## 2024-03-05 ENCOUNTER — Encounter: Payer: Self-pay | Admitting: Sports Medicine

## 2024-03-05 DIAGNOSIS — M3219 Other organ or system involvement in systemic lupus erythematosus: Secondary | ICD-10-CM | POA: Diagnosis not present

## 2024-03-05 DIAGNOSIS — M359 Systemic involvement of connective tissue, unspecified: Secondary | ICD-10-CM | POA: Diagnosis not present

## 2024-03-05 DIAGNOSIS — G8929 Other chronic pain: Secondary | ICD-10-CM | POA: Diagnosis not present

## 2024-03-05 DIAGNOSIS — M2242 Chondromalacia patellae, left knee: Secondary | ICD-10-CM

## 2024-03-05 DIAGNOSIS — M25562 Pain in left knee: Secondary | ICD-10-CM

## 2024-03-05 MED ORDER — CELECOXIB 100 MG PO CAPS: 100.0000 mg | ORAL_CAPSULE | Freq: Every day | ORAL | 0 refills | Status: AC

## 2024-03-05 NOTE — Progress Notes (Signed)
 Evelyn [name] - 38 y.o. female MRN [id number]  Date of birth: 1986-05-18  Office Visit Note: Visit Date: 03/05/2024 PCP: Gwenlyn Found, MD Referred by: Gwenlyn Found, MD  Subjective: Chief Complaint  Patient presents with   Left Knee - Pain   HPI: Evelyn [name] is a pleasant 38 y.o. female who presents today for chronic left knee pain.  She has had chronic left knee pain for many years.  She did have an MRI back in 2022 which showed articular cartilage loss in the undersurface of patella, lateral greater than medial.  She has had injections in the past with mixed relief.  She did have gel injections in the past which gave her the best and longest relief.  Unfortunately due to insurance she has had difficulty with coverage of these.  She is interested in considering this again today.  Her pain is quite bothersome, she is unable to kneel at church onto the knee because of the pain.  Continues to report crepitus.  She does have lupus as well, follows with rheumatology. Takes Tylenol-3.  Pertinent ROS were reviewed with the patient and found to be negative unless otherwise specified above in HPI.   Assessment & Plan: Visit Diagnoses:  1. Chronic pain of left knee   2. Chondromalacia patellae, left knee   3. Autoimmune disease (HCC)   4. Other systemic lupus erythematosus with other organ involvement (HCC)    Plan: Impression is acute on chronic left knee pain with chondromalacia patella in the setting of autoimmune disease/SLE.  Her left knee is quite symptomatic, specifically from the patellofemoral joint.  In the past she did receive excellent relief from prior viscosupplementation, we did have a discussion regarding this today.  The best option would be TriVisc, I did discuss the price with her and she is agreeable to getting authorized for these and setting these up once approved.  I will send a message to our scheduler to help coordinate.  I do think her rheumatoid  condition may be contributing as well, I would like to start her on a short course only of Celebrex 100 mg daily for the next 2 weeks or so to see how she responds.  Given her concomitant low-dose methotrexate, this is not something she will take chronically consistently.  We will call her once approved for TriVisc and set up for a series of 3 injections 1 week apart.  This patient is diagnosed with osteoarthritis of the left knee.    Radiographs show evidence of joint space narrowing, osteophytes, subchondral sclerosis and/or subchondral cysts.  This patient has knee pain which interferes with functional and activities of daily living.    This patient has experienced inadequate response, adverse effects and/or intolerance with conservative treatments such as acetaminophen, NSAIDS, topical creams, physical therapy or regular exercise, knee bracing and/or weight loss.   This patient has experienced inadequate response or has a contraindication to intra articular steroid injections for at least 3 months.   This patient is not scheduled to have a total knee replacement within 6 months of starting treatment with viscosupplementation.   Follow-up: Return for will call once approved for visco injection L-knee.   Meds & Orders:  Meds ordered this encounter  Medications   celecoxib (CELEBREX) 100 MG capsule    Sig: Take 1 capsule (100 mg total) by mouth daily.    Dispense:  30 capsule    Refill:  0   No orders of the defined types  were placed in this encounter.    Procedures: No procedures performed      Clinical History: No specialty comments available.  She reports that she has never smoked. She has never used smokeless tobacco. No results for input(s): "HGBA1C", "LABURIC" in the last 8760 hours.  Objective:    Physical Exam  Gen: Well-appearing, in no acute distress; non-toxic CV: Well-perfused. Warm.  Resp: Breathing unlabored on room air; no wheezing. Psych: Fluid speech in  conversation; appropriate affect; normal thought process  Ortho Exam - Left knee: There is generalized tenderness surrounding the patella on both sides of the knee.  There is no significant effusion, maybe trace effusion.  Range of motion is preserved from 0-135 degrees.  There is positive patellar crepitus.  Imaging:  *3 views x-rays of the left knee from 03/29/2023 were independently reviewed and interpreted by myself today.  AP, lateral and sunrise views were visualized.  X-rays demonstrate well-preserved tibiofemoral joint spaces.  There is mild patellofemoral arthritic change with a degree of patella alta and some joint space narrowing between the inferior patella and the tibia.  Narrative & Impression  CLINICAL DATA:  Left anterior/medial knee pain for 1 month.   EXAM: MRI OF THE LEFT KNEE WITHOUT CONTRAST   TECHNIQUE: Multiplanar, multisequence MR imaging of the knee was performed. No intravenous contrast was administered.   COMPARISON:  X-ray knee 10/03/2021.   FINDINGS: MENISCI   Medial meniscus:  Intact.   Lateral meniscus: Intact.   LIGAMENTS   Cruciates: Intact ACL and PCL.   Collaterals: Medial collateral ligament is intact. Lateral collateral ligament complex is intact.   CARTILAGE   Patellofemoral: Increased T2 signal within the articular cartilage of the lateral patellar facet with probable partial thickness surface irregularity (series 6, image 12), although no well-defined defect or fissure. No trochlear chondral defect.   Medial:  No chondral defect.   Lateral: No chondral defect.   Joint: Small joint effusion. Mild edema within the superolateral aspect of Hoffa's fat.   Popliteal Fossa: No Baker's cyst.  Intact popliteus tendon.   Extensor Mechanism: Intact quadriceps tendon and patellofemoral tendon.   Bones: No focal marrow signal abnormality. No fracture or dislocation.   Other: Unremarkable muscle bulk and signal intensity.    IMPRESSION: 1. Increased T2 signal within the articular cartilage of the lateral patellar facet with probable partial thickness surface irregularity, although no well-defined defect or fissure. 2. Mild edema within the superolateral aspect of Hoffa's fat pad, which can be seen in the setting of patellar tendon-lateral femoral condyle friction syndrome. 3. Small joint effusion. 4. Intact menisci.  Intact cruciate and collateral ligaments.     Electronically Signed   By: Duanne Guess D.O.   On: 10/24/2021 09:17    Past Medical/Family/Surgical/Social History: Medications & Allergies reviewed per EMR, new medications updated. Patient Active Problem List   Diagnosis Date Noted   Intrinsic atopic dermatitis 11/18/2023   Reactive airway disease 11/18/2023   Adverse food reaction 11/18/2023   Chronic rhinitis 11/18/2023   Chronic urticaria 11/18/2023   Pain of left side of body 07/02/2019   Paresthesia 07/02/2019   Vulvovaginal candidiasis 06/23/2019   Gastroesophageal reflux disease 01/27/2019   Systemic lupus erythematosus (HCC) 11/19/2018   Raynaud's disease without gangrene 11/19/2018   High risk medication use 11/19/2018   History of bilateral carpal tunnel release 11/19/2018   Autoimmune disease (HCC) 10/08/2018   Carpal tunnel syndrome, right upper limb 09/26/2018   Radicular pain in right arm 09/16/2018  History of pregnancy induced hypertension 08/03/2014   History of pre-eclampsia 08/03/2014   Chronic back pain 01/14/2014   Allergic rhinitis 01/14/2014   Past Medical History:  Diagnosis Date   Depression    post partum   GERD (gastroesophageal reflux disease)    Systemic lupus erythematosus (HCC)    Umbilical hernia    Family History  Problem Relation Age of Onset   Healthy Mother    Other Father        never met her father - unsure of his history   Healthy Sister    Allergy (severe) Sister        sunblocks and mosquito repelent.   Healthy Brother     Healthy Daughter    Asthma Son    Healthy Son    Healthy Son    Healthy Son    Breast cancer Cousin 26       undergoing chemo/radiation   Heart disease Neg Hx    Neurologic Disorder Neg Hx    Colon cancer Neg Hx    Esophageal cancer Neg Hx    Past Surgical History:  Procedure Laterality Date   BREAST LUMPECTOMY WITH RADIOACTIVE SEED LOCALIZATION Right 03/09/2020   Procedure: RIGHT BREAST LUMPECTOMY WITH RADIOACTIVE SEED LOCALIZATION;  Surgeon: Harriette Bouillon, MD;  Location: [facility];  Service: General;  Laterality: Right;   CARPAL TUNNEL RELEASE Right 09/26/2018   Procedure: RIGHT CARPAL TUNNEL RELEASE;  Surgeon: Tarry Kos, MD;  Location: [facility];  Service: Orthopedics;  Laterality: Right;   CARPAL TUNNEL RELEASE Left 2017   CESAREAN SECTION N/A 08/05/2014   Procedure: CESAREAN SECTION;  Surgeon: Marlow Baars, MD;  Location: [facility];  Service: Obstetrics;  Laterality: N/A;   KNEE SURGERY Left 2000   LAPAROSCOPIC BILATERAL SALPINGECTOMY N/A 12/13/2015   Procedure: LAPAROSCOPIC BILATERAL SALPINGECTOMY;  Surgeon: Allie Bossier, MD;  Location: [facility];  Service: Gynecology;  Laterality: N/A;   PERINEOPLASTY N/A 02/20/2016   Procedure: PERINEOPLASTY;  Surgeon: Allie Bossier, MD;  Location: [facility];  Service: Gynecology;  Laterality: N/A;   PUBOVAGINAL SLING N/A 12/13/2015   Procedure: Leonides Grills;  Surgeon: Allie Bossier, MD;  Location: [facility];  Service: Gynecology;  Laterality: N/A;   RECTOCELE REPAIR N/A 02/20/2016   Procedure: POSTERIOR REPAIR (RECTOCELE);  Surgeon: Allie Bossier, MD;  Location: [facility];  Service: Gynecology;  Laterality: N/A;   UMBILICAL HERNIA REPAIR  2011   Social History   Occupational History   Occupation: Dentist - part-time  Tobacco Use   Smoking status: Never   Smokeless tobacco: Never  Vaping Use   Vaping status: Never Used  Substance and Sexual Activity   Alcohol use: Not Currently   Drug use: No   Sexual  activity: Yes    Partners: Male    Birth control/protection: Surgical, I.U.D.

## 2024-03-05 NOTE — Progress Notes (Signed)
 Patient says that she has been seen and evaluated for her left knee before. She has had good pain relief with gel injections in the past, and says those worked better than steroid. She was planning to come back last year for another gel injection but was denied by insurance. She says that her pain is worsening. She has constant aching pain, and her pain does get worse when she is active and on her feet.

## 2024-03-17 ENCOUNTER — Telehealth: Payer: Self-pay

## 2024-03-17 NOTE — Telephone Encounter (Signed)
 Submitted for TriVisc, left knee online through [facility].  Patient is aware that she will receive a call for payment of gel injections.

## 2024-03-17 NOTE — Telephone Encounter (Signed)
 Called and left a VM advising patient that Medicaid does not cover gel injections at all. Advised of TriVisc and to give me a call back if she would like to proceed.

## 2024-03-17 NOTE — Telephone Encounter (Signed)
-----   Message from Clyde W sent at 03/05/2024 11:16 AM EDT ----- Regarding: Gel Injections - L knee Avyukt Cimo,  Could we get this patient set up for gel injections please? Left knee.  Thank you, Lonzell Robin

## 2024-03-18 ENCOUNTER — Other Ambulatory Visit: Payer: Self-pay

## 2024-03-20 ENCOUNTER — Other Ambulatory Visit: Payer: Self-pay | Admitting: Pharmacy Technician

## 2024-03-20 ENCOUNTER — Other Ambulatory Visit: Payer: Self-pay

## 2024-03-20 NOTE — Progress Notes (Signed)
 Specialty Pharmacy Refill Coordination Note  Evelyn [name] is a 38 y.o. female contacted today regarding refills of specialty medication(s) Dupilumab  (Dupixent )   Patient requested Delivery   Delivery date: 03/24/24   Verified address: [address] [place]   Medication will be filled on 03/23/24.

## 2024-03-23 ENCOUNTER — Other Ambulatory Visit: Payer: Self-pay

## 2024-04-01 NOTE — Progress Notes (Signed)
 [facility] - Clinic Note  04/08/2024     CHIEF COMPLAINT Patient presents for Retina Follow Up   HISTORY OF PRESENT ILLNESS: Evelyn [name] is a 38 y.o. female who presents to the clinic today for:   HPI     Retina Follow Up   In both eyes.  This started 1 year ago.  Duration of 1 year.  Since onset it is stable.  I, the attending physician,  performed the HPI with the patient and updated documentation appropriately.        Comments   Patient feels the vision is still the same. She is using AT's. She is still taking Plaquenil . She has been taking it for 7 yrs (2017). She is taking Plaquenil  200 mg PO daily. 200 mg/69.3kg daily= 2.89 mg/kg/day      Last edited by Nayellie Sanseverino, MD on 04/10/2024  2:06 AM.    Patient states she is still taking Plaquenil , she takes 200mg  a day  Referring physician: Audria Leather, MD [id number] [place],  [place] [address]  HISTORICAL INFORMATION:   Selected notes from the MEDICAL RECORD NUMBER Referred by Dr. Marvin Slot for eval of lattice holes OU.   CURRENT MEDICATIONS: Current Outpatient Medications (Ophthalmic Drugs)  Medication Sig   Olopatadine  HCl (PATADAY ) 0.2 % SOLN Place 1 drop into both eyes daily as needed.   Olopatadine  HCl (PATADAY ) 0.2 % SOLN 1 drop each eye daily as needed.   No current facility-administered medications for this visit. (Ophthalmic Drugs)   Current Outpatient Medications (Other)  Medication Sig   albuterol  (VENTOLIN  HFA) 108 (90 Base) MCG/ACT inhaler Inhale two puffs every 4-6 hours if needed for cough or wheeze.   Carbinoxamine  Maleate 4 MG/5ML SOLN Take 10 mLs (8 mg total) by mouth 2 (two) times daily.   Cholecalciferol (VITAMIN D ) 50 MCG (2000 UT) CAPS Take by mouth daily.   dupilumab  (DUPIXENT ) 300 MG/2ML prefilled syringe Inject 300 mg into the skin every 14 (fourteen) days.   EPINEPHrine  0.3 mg/0.3 mL IJ SOAJ injection As needed for life-threatening allergic reactions    famotidine  (PEPCID ) 20 MG tablet Take 1 tablet (20 mg total) by mouth 2 (two) times daily.   folic acid (FOLVITE) 1 MG tablet Take 1 tablet by mouth daily. (Patient not taking: Reported on 11/18/2023)   hydroxychloroquine  (PLAQUENIL ) 200 MG tablet Take 1 tablet by mouth twice daily Monday through Friday only.   ipratropium (ATROVENT ) 0.06 % nasal spray 2 sprays each nostril up to 3-4 times a day as needed for nasal congestion/drainage.   levocetirizine (XYZAL ) 5 MG tablet Take 1 tablet by mouth daily as needed for runny nose or itch.   methotrexate (RHEUMATREX) 2.5 MG tablet Take 15 mg by mouth once a week. (Patient not taking: Reported on 11/18/2023)   modafinil (PROVIGIL) 100 MG tablet Take 100 mg by mouth daily.   nystatin  ointment (MYCOSTATIN ) Apply 1 Application topically 2 (two) times daily.   nystatin -triamcinolone  ointment (MYCOLOG) Apply 1 Application topically 2 (two) times daily.   Olopatadine -Mometasone (RYALTRIS ) 665-25 MCG/ACT SUSP Apply 2 sprays each nostril twice a day as needed (Patient not taking: Reported on 11/18/2023)   RYVENT  6 MG TABS Take 1 tablet (6 mg total) by mouth in the morning and at bedtime.   traMADol  (ULTRAM ) 50 MG tablet Take 1 tablet (50 mg total) by mouth 3 (three) times daily as needed. (Patient not taking: Reported on 11/18/2023)   triamcinolone  (KENALOG ) 0.1 %  paste Place onto teeth.   triamcinolone  ointment (KENALOG ) 0.1 % Apply 1 Application topically 2 (two) times daily.   valACYclovir  (VALTREX ) 500 MG tablet Take 1 tablet (500 mg total) by mouth daily.   venlafaxine  XR (EFFEXOR -XR) 75 MG 24 hr capsule Take 3 capsules (225 mg total) by mouth daily with breakfast.   Current Facility-Administered Medications (Other)  Medication Route   dupilumab  (DUPIXENT ) prefilled syringe 300 mg Subcutaneous   REVIEW OF SYSTEMS: ROS   Positive for: Gastrointestinal, Musculoskeletal, Eyes Negative for: Constitutional, Neurological, Skin, Genitourinary, HENT,  Endocrine, Cardiovascular, Respiratory, Psychiatric, Allergic/Imm, Heme/Lymph Last edited by Olene Berne, COT on 04/08/2024  1:08 PM.      ALLERGIES No Known Allergies  PAST MEDICAL HISTORY Past Medical History:  Diagnosis Date   Depression    post partum   GERD (gastroesophageal reflux disease)    Systemic lupus erythematosus (HCC)    Umbilical hernia    Past Surgical History:  Procedure Laterality Date   BREAST LUMPECTOMY WITH RADIOACTIVE SEED LOCALIZATION Right 03/09/2020   Procedure: RIGHT BREAST LUMPECTOMY WITH RADIOACTIVE SEED LOCALIZATION;  Surgeon: Sim Dryer, MD;  Location: [facility];  Service: General;  Laterality: Right;   CARPAL TUNNEL RELEASE Right 09/26/2018   Procedure: RIGHT CARPAL TUNNEL RELEASE;  Surgeon: Wes Hamman, MD;  Location: [facility];  Service: Orthopedics;  Laterality: Right;   CARPAL TUNNEL RELEASE Left 2017   CESAREAN SECTION N/A 08/05/2014   Procedure: CESAREAN SECTION;  Surgeon: Luan Rumpf, MD;  Location: [facility];  Service: Obstetrics;  Laterality: N/A;   KNEE SURGERY Left 2000   LAPAROSCOPIC BILATERAL SALPINGECTOMY N/A 12/13/2015   Procedure: LAPAROSCOPIC BILATERAL SALPINGECTOMY;  Surgeon: Ana Balling, MD;  Location: [facility];  Service: Gynecology;  Laterality: N/A;   PERINEOPLASTY N/A 02/20/2016   Procedure: PERINEOPLASTY;  Surgeon: Ana Balling, MD;  Location: [facility];  Service: Gynecology;  Laterality: N/A;   PUBOVAGINAL SLING N/A 12/13/2015   Procedure: Gino Lais;  Surgeon: Ana Balling, MD;  Location: [facility];  Service: Gynecology;  Laterality: N/A;   RECTOCELE REPAIR N/A 02/20/2016   Procedure: POSTERIOR REPAIR (RECTOCELE);  Surgeon: Ana Balling, MD;  Location: [facility];  Service: Gynecology;  Laterality: N/A;   UMBILICAL HERNIA REPAIR  2011    FAMILY HISTORY Family History  Problem Relation Age of Onset   Healthy Mother    Other Father        never met her father - unsure of his history    Healthy Sister    Allergy (severe) Sister        sunblocks and mosquito repelent.   Healthy Brother    Healthy Daughter    Asthma Son    Healthy Son    Healthy Son    Healthy Son    Breast cancer Cousin 26       undergoing chemo/radiation   Heart disease Neg Hx    Neurologic Disorder Neg Hx    Colon cancer Neg Hx    Esophageal cancer Neg Hx    SOCIAL HISTORY Social History   Tobacco Use   Smoking status: Never   Smokeless tobacco: Never  Vaping Use   Vaping status: Never Used  Substance Use Topics   Alcohol use: Not Currently   Drug use: No       OPHTHALMIC EXAM:  Base Eye Exam     Visual Acuity (Snellen - Linear)       Right Left   Dist  cc 20/20 20/20    Correction: Glasses         Tonometry (Tonopen, 1:17 PM)       Right Left   Pressure 14 14         Pupils       Dark Light Shape React APD   Right 4 3 Round Brisk None   Left 4 3 Round Brisk None         Visual Fields       Left Right    Full Full         Extraocular Movement       Right Left    Full, Ortho Full, Ortho         Neuro/Psych     Oriented x3: Yes   Mood/Affect: Normal         Dilation     Both eyes: 1.0% Mydriacyl, 2.5% Phenylephrine  @ 1:13 PM           Additional Tests     Color       Right Left   Ishihara 11/12 10/12           Slit Lamp and Fundus Exam     Slit Lamp Exam       Right Left   Lids/Lashes Mild Telangiectasia Mild Telangiectasia   Conjunctiva/Sclera White and quiet White and quiet   Cornea Clear Clear   Anterior Chamber deep and clear Deep and quiet   Iris Round and dilated Round and dilated   Lens Clear Clear   Anterior Vitreous Trace Vitreous syneresis, no cell Trace Vitreous syneresis, no cell         Fundus Exam       Right Left   Disc Pink and Sharp Pink and Sharp   C/D Ratio 0.3 0.4   Macula Flat, Good foveal reflex, No heme or edema Flat, Good foveal reflex, No heme or edema   Vessels Normal Normal    Periphery Attached, temporal WWP, flap tear at 0930 ora, small patch of lattice with atrophic holes at 1000 -- good laser changes surrounding, mild patch of lattice at 0600 very anterior micro tear at 0700 ora -- good laser changes surrounding, patch of lattice at 1030-11 -- good laser in place around all lesions, No new RT/RD/lattice Attached, mild patch of pigmented lattice at 0430, peripheral patch of lattice extending from 0500-0730, focal pigmented patch at 0730 equator -- excellent laser changes in place, No new RT/RD           Refraction     Wearing Rx       Sphere Cylinder Axis   Right -4.00 +1.00 103   Left -3.75 +1.00 073    Type: SVL            IMAGING AND PROCEDURES  OCT, Retina - OU - Both Eyes        Right Eye Quality was good. Central Foveal Thickness: 276. Progression has been stable. Findings include normal foveal contour, no IRF, no SRF, vitreomacular adhesion (No ellipsoid loss).   Left Eye Quality was good. Central Foveal Thickness: 270. Progression has been stable. Findings include normal foveal contour, no IRF, no SRF, vitreomacular adhesion (No ellipsoid loss).   Notes  *Images captured and stored on drive  Diagnosis / Impression:  NFP, no IRF/SRF OU--stable VMA OU No ellipsoid loss or plaquenil  toxicity OU  Clinical management:  See below  Abbreviations: NFP - Normal foveal profile. CME - cystoid macular edema. PED -  pigment epithelial detachment. IRF - intraretinal fluid. SRF - subretinal fluid. EZ - ellipsoid zone. ERM - epiretinal membrane. ORA - outer retinal atrophy. ORT - outer retinal tubulation. SRHM - subretinal hyper-reflective material            ASSESSMENT/PLAN:    ICD-10-CM   1. Bilateral retinal lattice degeneration  H35.413     2. Bilateral retinal defect  H33.303     3. Myopia of both eyes with astigmatism  H52.13    H52.203     4. Long-term use of Plaquenil   Z79.899 OCT, Retina - OU - Both Eyes    5. Other  systemic lupus erythematosus with other organ involvement (HCC)  M32.19       1,2. Lattice degeneration w/ retinal defects, both eyes  - right eye: flap tear at 0930 ora, small patch of pigmented lattice with atrophic holes at 1000, mild patch at 0600, very anterior micro tear at 0700 ora  - left eye: mild patch of pigmented lattice at 0430, peripheral patch of lattice extending from 0500-0730, focal pigmented patch at 0730 equator  - s/p laser retinopexy OD (12.22.20),laser touch up OD 02.23.21 -- excellent laser in place             - s/p laser retinopexy OS (01.12.20) -- excellent laser changes in place  - no new RT/RD or lattice OU  - f/u 1 year -- POV, DFE/OCT  3. Myopia with astigmatism OU  4,5. SLE on Plaquenil   - taking since 2017  - currently taking 200 mg daily  - no retinal toxicity noted on exam or OCT today  - baseline HVF 10-2 obtained 03.23.23 -- no significant field loss or defect OU  - pt's wt is now listed at 69 kg  - 200/69 = 2.89 mg/kg/day  - the AAO recommends daily dosing of < 5.0 mg/kg for HCQ  - recommend reduction of plaquenil  dosing to < 5.0 mg/kg/day or switching to another agent for SLE    Ophthalmic Meds Ordered this visit:  No orders of the defined types were placed in this encounter.    Return in about 1 year (around 04/08/2025) for plaquenil  exam, DFE, OCT.  There are no Patient Instructions on file for this visit.   Explained the diagnoses, plan, and follow up with the patient and they expressed understanding.  Patient expressed understanding of the importance of proper follow up care.   This document serves as a record of services personally performed by Jeanice Millard, MD, PhD. It was created on their behalf by Angelia Kelp, an ophthalmic technician. The creation of this record is the provider's dictation and/or activities during the visit.    Electronically signed by: Angelia Kelp, OA, 04/10/24  2:06 AM  This document serves as a  record of services personally performed by Jeanice Millard, MD, PhD. It was created on their behalf by Morley Arabia. Bevin Bucks, OA an ophthalmic technician. The creation of this record is the provider's dictation and/or activities during the visit.    Electronically signed by: Morley Arabia. Bevin Bucks, OA 04/10/24 2:06 AM  Jeanice Millard, M.D., Ph.D. Diseases & Surgery of the Retina and Vitreous [facility]  I have reviewed the above documentation for accuracy and completeness, and I agree with the above. Jeanice Millard, M.D., Ph.D. 04/10/24 2:08 AM   Abbreviations: M myopia (nearsighted); A astigmatism; H hyperopia (farsighted); P presbyopia; Mrx spectacle prescription;  CTL contact lenses; OD right eye; OS left  eye; OU both eyes  XT exotropia; ET esotropia; PEK punctate epithelial keratitis; PEE punctate epithelial erosions; DES dry eye syndrome; MGD meibomian gland dysfunction; ATs artificial tears; PFAT's preservative free artificial tears; NSC nuclear sclerotic cataract; PSC posterior subcapsular cataract; ERM epi-retinal membrane; PVD posterior vitreous detachment; RD retinal detachment; DM diabetes mellitus; DR diabetic retinopathy; NPDR non-proliferative diabetic retinopathy; PDR proliferative diabetic retinopathy; CSME clinically significant macular edema; DME diabetic macular edema; dbh dot blot hemorrhages; CWS cotton wool spot; POAG primary open angle glaucoma; C/D cup-to-disc ratio; HVF humphrey visual field; GVF goldmann visual field; OCT optical coherence tomography; IOP intraocular pressure; BRVO Branch retinal vein occlusion; CRVO central retinal vein occlusion; CRAO central retinal artery occlusion; BRAO branch retinal artery occlusion; RT retinal tear; SB scleral buckle; PPV pars plana vitrectomy; VH Vitreous hemorrhage; PRP panretinal laser photocoagulation; IVK intravitreal kenalog ; VMT vitreomacular traction; MH Macular hole;  NVD neovascularization of the disc; NVE  neovascularization elsewhere; AREDS age related eye disease study; ARMD age related macular degeneration; POAG primary open angle glaucoma; EBMD epithelial/anterior basement membrane dystrophy; ACIOL anterior chamber intraocular lens; IOL intraocular lens; PCIOL posterior chamber intraocular lens; Phaco/IOL phacoemulsification with intraocular lens placement; PRK photorefractive keratectomy; LASIK laser assisted in situ keratomileusis; HTN hypertension; DM diabetes mellitus; COPD chronic obstructive pulmonary disease

## 2024-04-08 ENCOUNTER — Ambulatory Visit (INDEPENDENT_AMBULATORY_CARE_PROVIDER_SITE_OTHER): Payer: Medicaid Other | Admitting: Ophthalmology

## 2024-04-08 DIAGNOSIS — Z79899 Other long term (current) drug therapy: Secondary | ICD-10-CM

## 2024-04-08 DIAGNOSIS — M3219 Other organ or system involvement in systemic lupus erythematosus: Secondary | ICD-10-CM | POA: Diagnosis not present

## 2024-04-08 DIAGNOSIS — H35413 Lattice degeneration of retina, bilateral: Secondary | ICD-10-CM

## 2024-04-08 DIAGNOSIS — H33303 Unspecified retinal break, bilateral: Secondary | ICD-10-CM | POA: Diagnosis not present

## 2024-04-08 DIAGNOSIS — H5213 Myopia, bilateral: Secondary | ICD-10-CM

## 2024-04-10 ENCOUNTER — Encounter (INDEPENDENT_AMBULATORY_CARE_PROVIDER_SITE_OTHER): Payer: Self-pay | Admitting: Ophthalmology

## 2024-04-13 ENCOUNTER — Other Ambulatory Visit: Payer: Self-pay

## 2024-04-15 ENCOUNTER — Other Ambulatory Visit: Payer: Self-pay

## 2024-04-20 ENCOUNTER — Other Ambulatory Visit (HOSPITAL_COMMUNITY): Payer: Self-pay

## 2024-04-20 ENCOUNTER — Other Ambulatory Visit: Payer: Self-pay

## 2024-04-20 NOTE — Progress Notes (Signed)
 Specialty Pharmacy Refill Coordination Note  Evelyn [name] is a 38 y.o. female contacted today regarding refills of specialty medication(s) Dupilumab  (Dupixent )   Patient requested Delivery   Delivery date: 04/28/24   Verified address: [address]  [place] [address]   Medication will be filled on 04/27/24.

## 2024-04-21 ENCOUNTER — Other Ambulatory Visit: Payer: Self-pay

## 2024-04-21 NOTE — Progress Notes (Signed)
 Patient was left a voice mail that due the upcoming Memorial day holiday, their delivery date for their refill has been rescheduled for 04/29/24. Patient was advised on the voice mail to contact the pharmacy should that day not work out for them.

## 2024-04-22 ENCOUNTER — Other Ambulatory Visit: Payer: Self-pay

## 2024-04-22 DIAGNOSIS — G8929 Other chronic pain: Secondary | ICD-10-CM

## 2024-04-22 MED ORDER — LEVOCETIRIZINE DIHYDROCHLORIDE 5 MG PO TABS: ORAL_TABLET | ORAL | 0 refills | Status: DC

## 2024-04-23 ENCOUNTER — Encounter: Payer: Self-pay | Admitting: Sports Medicine

## 2024-04-23 ENCOUNTER — Ambulatory Visit: Admitting: Sports Medicine

## 2024-04-23 DIAGNOSIS — M2242 Chondromalacia patellae, left knee: Secondary | ICD-10-CM

## 2024-04-23 DIAGNOSIS — G8929 Other chronic pain: Secondary | ICD-10-CM

## 2024-04-23 DIAGNOSIS — M25562 Pain in left knee: Secondary | ICD-10-CM

## 2024-04-23 MED ORDER — BUPIVACAINE HCL 0.25 % IJ SOLN
2.0000 mL | INTRAMUSCULAR | Status: AC | PRN
  Administered 2024-04-23: 2 mL via INTRA_ARTICULAR

## 2024-04-23 MED ORDER — SODIUM HYALURONATE (VISCOSUP) 25 MG/2.5ML IX SOSY
25.0000 mg | PREFILLED_SYRINGE | INTRA_ARTICULAR | Status: AC | PRN
  Administered 2024-04-23: 25 mg via INTRA_ARTICULAR

## 2024-04-23 MED ORDER — LIDOCAINE HCL 1 % IJ SOLN
2.0000 mL | INTRAMUSCULAR | Status: AC | PRN
Start: 2024-04-23 — End: 2024-04-23
  Administered 2024-04-23: 2 mL

## 2024-04-23 NOTE — Progress Notes (Signed)
 Patient says that her pain has been especially bad the last few days. She is inquiring about potential pain/discomfort after the injection and what she can do to help with that, as she remembers having increased pain for a few days after her gel injection in the past.

## 2024-04-23 NOTE — Progress Notes (Signed)
 Evelyn [name] - 38 y.o. female MRN [id number]  Date of birth: [birth year]  Office Visit Note: Visit Date: 04/23/2024 PCP: Audria Leather, MD Referred by: Audria Leather, MD  Subjective: Chief Complaint  Patient presents with   Left Knee - Pain   HPI: Evelyn [name] is a pleasant 38 y.o. female who presents today for chronic left knee pain with PF-arthralgia/chondromalacia.  Evelyn [name] continues with rather bothersome knee pain.  She did tolerate the oral Celebrex  100 mg daily for her short course.  May have helped some, but did not provide her significant relief.  She has questions regarding pain after the viscosupplementation injection, as in the past she had a single dose injection and had quite a bit of soreness the following 2-3 days.  Pertinent ROS were reviewed with the patient and found to be negative unless otherwise specified above in HPI.   Assessment & Plan: Visit Diagnoses:  1. Chronic pain of left knee   2. Chondromalacia patellae, left knee    Plan: Impression is chronic left knee pain in the patellofemoral region with underlying chondromalacia patella.  We did proceed with her first injection of TriVisc into the left knee.  She will present for TriVisc injection only over the subsequent 2 weeks.  We had a discussion regarding postinjection flare/pain, I did discuss using alternating ice/heat for the next 48 hours.  I also will send in a short few tablets of Celebrex  to take only as needed for any post inflammation pain.  I did discuss that given this is a 3 series stat, people tend to have less of a reaction compared to the 1 dose series.  She is understanding.  Will follow-up next week.  May consider getting her into a rehab exercise for the patellofemoral joint of the knee following her injection series.  Follow-up: Return in about 1 week (around 04/30/2024).   Meds & Orders: No orders of the defined types were placed in this encounter.   Orders Placed This  Encounter  Procedures   Large Joint Inj     Procedures: Large Joint Inj: L knee on 04/23/2024 2:45 PM Indications: pain Details: 22 G 1.5 in needle, anterolateral approach Medications: 2 mL lidocaine  1 %; 2 mL bupivacaine  0.25 %; 25 mg Sodium Hyaluronate (Viscosup) 25 MG/2.5ML Outcome: tolerated well, no immediate complications Procedure, treatment alternatives, risks and benefits explained, specific risks discussed. Consent was given by the patient. Patient was prepped and draped in the usual sterile fashion.          Clinical History: No specialty comments available.  She reports that she has never smoked. She has never used smokeless tobacco. No results for input(s): "HGBA1C", "LABURIC" in the last 8760 hours.  Objective:    Physical Exam  Gen: Well-appearing, in no acute distress; non-toxic CV: Well-perfused. Warm.  Resp: Breathing unlabored on room air; no wheezing. Psych: Fluid speech in conversation; appropriate affect; normal thought process  Ortho Exam - Left knee: There is no significant redness swelling or effusion.  There is patellar crepitus.  Range of motion is otherwise preserved from 0-135 degrees.  Imaging: No results found.  Past Medical/Family/Surgical/Social History: Medications & Allergies reviewed per EMR, new medications updated. Patient Active Problem List   Diagnosis Date Noted   Intrinsic atopic dermatitis 11/18/2023   Reactive airway disease 11/18/2023   Adverse food reaction 11/18/2023   Chronic rhinitis 11/18/2023   Chronic urticaria 11/18/2023   Pain of left side of body 07/02/2019  Paresthesia 07/02/2019   Vulvovaginal candidiasis 06/23/2019   Gastroesophageal reflux disease 01/27/2019   Systemic lupus erythematosus (HCC) 11/19/2018   Raynaud's disease without gangrene 11/19/2018   High risk medication use 11/19/2018   History of bilateral carpal tunnel release 11/19/2018   Autoimmune disease (HCC) 10/08/2018   Carpal tunnel  syndrome, right upper limb 09/26/2018   Radicular pain in right arm 09/16/2018   History of pregnancy induced hypertension 08/03/2014   History of pre-eclampsia 08/03/2014   Chronic back pain 01/14/2014   Allergic rhinitis 01/14/2014   Past Medical History:  Diagnosis Date   Depression    post partum   GERD (gastroesophageal reflux disease)    Systemic lupus erythematosus (HCC)    Umbilical hernia    Family History  Problem Relation Age of Onset   Healthy Mother    Other Father        never met her father - unsure of his history   Healthy Sister    Allergy (severe) Sister        sunblocks and mosquito repelent.   Healthy Brother    Healthy Daughter    Asthma Son    Healthy Son    Healthy Son    Healthy Son    Breast cancer Cousin 26       undergoing chemo/radiation   Heart disease Neg Hx    Neurologic Disorder Neg Hx    Colon cancer Neg Hx    Esophageal cancer Neg Hx    Past Surgical History:  Procedure Laterality Date   BREAST LUMPECTOMY WITH RADIOACTIVE SEED LOCALIZATION Right 03/09/2020   Procedure: RIGHT BREAST LUMPECTOMY WITH RADIOACTIVE SEED LOCALIZATION;  Surgeon: Sim Dryer, MD;  Location: [facility];  Service: General;  Laterality: Right;   CARPAL TUNNEL RELEASE Right 09/26/2018   Procedure: RIGHT CARPAL TUNNEL RELEASE;  Surgeon: Wes Hamman, MD;  Location: [facility];  Service: Orthopedics;  Laterality: Right;   CARPAL TUNNEL RELEASE Left 2017   CESAREAN SECTION N/A 08/05/2014   Procedure: CESAREAN SECTION;  Surgeon: Luan Rumpf, MD;  Location: [facility];  Service: Obstetrics;  Laterality: N/A;   KNEE SURGERY Left 2000   LAPAROSCOPIC BILATERAL SALPINGECTOMY N/A 12/13/2015   Procedure: LAPAROSCOPIC BILATERAL SALPINGECTOMY;  Surgeon: Ana Balling, MD;  Location: [facility];  Service: Gynecology;  Laterality: N/A;   PERINEOPLASTY N/A 02/20/2016   Procedure: PERINEOPLASTY;  Surgeon: Ana Balling, MD;  Location: [facility];  Service:  Gynecology;  Laterality: N/A;   PUBOVAGINAL SLING N/A 12/13/2015   Procedure: Gino Lais;  Surgeon: Ana Balling, MD;  Location: [facility];  Service: Gynecology;  Laterality: N/A;   RECTOCELE REPAIR N/A 02/20/2016   Procedure: POSTERIOR REPAIR (RECTOCELE);  Surgeon: Ana Balling, MD;  Location: [facility];  Service: Gynecology;  Laterality: N/A;   UMBILICAL HERNIA REPAIR  2011   Social History   Occupational History   Occupation: Dentist - part-time  Tobacco Use   Smoking status: Never   Smokeless tobacco: Never  Vaping Use   Vaping status: Never Used  Substance and Sexual Activity   Alcohol use: Not Currently   Drug use: No   Sexual activity: Yes    Partners: Male    Birth control/protection: Surgical, I.U.D.

## 2024-04-24 ENCOUNTER — Other Ambulatory Visit: Payer: Self-pay | Admitting: Sports Medicine

## 2024-04-24 ENCOUNTER — Encounter: Payer: Self-pay | Admitting: Sports Medicine

## 2024-04-24 MED ORDER — CELECOXIB 100 MG PO CAPS: 100.0000 mg | ORAL_CAPSULE | Freq: Every day | ORAL | 0 refills | Status: DC

## 2024-04-28 ENCOUNTER — Other Ambulatory Visit: Payer: Self-pay

## 2024-04-30 ENCOUNTER — Ambulatory Visit: Admitting: Sports Medicine

## 2024-04-30 ENCOUNTER — Encounter: Payer: Self-pay | Admitting: Sports Medicine

## 2024-04-30 DIAGNOSIS — M1712 Unilateral primary osteoarthritis, left knee: Secondary | ICD-10-CM | POA: Diagnosis not present

## 2024-04-30 DIAGNOSIS — M25562 Pain in left knee: Secondary | ICD-10-CM | POA: Diagnosis not present

## 2024-04-30 DIAGNOSIS — G8929 Other chronic pain: Secondary | ICD-10-CM

## 2024-04-30 DIAGNOSIS — M2242 Chondromalacia patellae, left knee: Secondary | ICD-10-CM

## 2024-04-30 MED ORDER — SODIUM HYALURONATE (VISCOSUP) 25 MG/2.5ML IX SOSY
25.0000 mg | PREFILLED_SYRINGE | INTRA_ARTICULAR | Status: AC | PRN
  Administered 2024-04-30: 25 mg via INTRA_ARTICULAR

## 2024-04-30 NOTE — Progress Notes (Signed)
   Procedure Note  Patient: Sherion Dooly             Date of Birth: 20-Jun-1986           MRN: [id number]             Visit Date: 04/30/2024  Procedures: Visit Diagnoses:  1. Chronic pain of left knee   2. Chondromalacia patellae, left knee    Large Joint Inj: L knee on 04/30/2024 2:13 PM Indications: pain Details: 22 G 1.5 in needle, anterolateral approach Medications: 25 mg Sodium Hyaluronate (Viscosup) 25 MG/2.5ML Outcome: tolerated well, no immediate complications Procedure, treatment alternatives, risks and benefits explained, specific risks discussed. Consent was given by the patient. Patient was prepped and draped in the usual sterile fashion.     - Patient tolerated injection well, discussed postinjection protocol and instruction - She will present next week for TriVisc #3 of 3  Shauna Del, DO Primary Care Sports Medicine Physician  [facility]  This note was dictated using Dragon naturally speaking software and may contain errors in syntax, spelling, or content which have not been identified prior to signing this note.

## 2024-04-30 NOTE — Progress Notes (Signed)
 Patient says that she did not have any significant pain or soreness after the first injection last week. She has had pain from a gel injection in the past, but says that this time has not felt like that. She is still having her "normal" soreness and pain today.

## 2024-05-10 ENCOUNTER — Other Ambulatory Visit: Payer: Self-pay | Admitting: Allergy

## 2024-05-11 ENCOUNTER — Encounter: Payer: Self-pay | Admitting: Sports Medicine

## 2024-05-11 ENCOUNTER — Ambulatory Visit: Admitting: Sports Medicine

## 2024-05-11 DIAGNOSIS — M1712 Unilateral primary osteoarthritis, left knee: Secondary | ICD-10-CM

## 2024-05-11 DIAGNOSIS — G8929 Other chronic pain: Secondary | ICD-10-CM

## 2024-05-11 DIAGNOSIS — M25562 Pain in left knee: Secondary | ICD-10-CM

## 2024-05-11 DIAGNOSIS — M2242 Chondromalacia patellae, left knee: Secondary | ICD-10-CM | POA: Diagnosis not present

## 2024-05-11 MED ORDER — SODIUM HYALURONATE (VISCOSUP) 25 MG/2.5ML IX SOSY
25.0000 mg | PREFILLED_SYRINGE | INTRA_ARTICULAR | Status: AC | PRN
  Administered 2024-05-11: 25 mg via INTRA_ARTICULAR

## 2024-05-11 NOTE — Progress Notes (Signed)
   Procedure Note  Patient: Evelyn [name]             Date of Birth: [birth year]           MRN: [id number]             Visit Date: 05/11/2024  Procedures: Visit Diagnoses:  1. Chondromalacia patellae, left knee   2. Chronic pain of left knee    Large Joint Inj: L knee on 05/11/2024 9:17 AM Indications: pain Details: 22 G 1.5 in needle, anterolateral approach Medications: 25 mg Sodium Hyaluronate (Viscosup) 25 MG/2.5ML Outcome: tolerated well, no immediate complications Procedure, treatment alternatives, risks and benefits explained, specific risks discussed. Consent was given by the patient. Patient was prepped and draped in the usual sterile fashion.      - Patient tolerated injection well, discussed postinjection protocol and instruction  - I did provide a handout for PF-knee exercises and rehab sheet that I would like Evelyn [name] to perform once daily starting Wed/Thursday - follow-up as needed  Evelyn Del, DO Primary Care Sports Medicine Physician  [facility] - Orthopedics  This note was dictated using Dragon naturally speaking software and may contain errors in syntax, spelling, or content which have not been identified prior to signing this note.   Patient was instructed in 10 minutes of therapeutic exercises for knee and patellofemoral joint to improve strength, ROM and function according to my instructions and plan of care by myself during the office visit. A customized handout was provided and demonstration of proper technique shown and discussed. Patient did perform exercises and demonstrate understanding through teachback.  All questions discussed and answered.

## 2024-05-11 NOTE — Progress Notes (Signed)
 Patient says that her knee feels about the same. No new soreness or pain that she has noticed since her second gel injection.

## 2024-05-25 ENCOUNTER — Other Ambulatory Visit: Payer: Self-pay | Admitting: Sports Medicine

## 2024-05-25 ENCOUNTER — Other Ambulatory Visit (HOSPITAL_COMMUNITY): Payer: Self-pay

## 2024-05-27 ENCOUNTER — Other Ambulatory Visit: Payer: Self-pay

## 2024-05-29 ENCOUNTER — Other Ambulatory Visit: Payer: Self-pay

## 2024-05-29 ENCOUNTER — Other Ambulatory Visit (HOSPITAL_COMMUNITY): Payer: Self-pay

## 2024-05-29 NOTE — Progress Notes (Signed)
 Specialty Pharmacy Refill Coordination Note  Spoke with Rayni, Nemitz (Self).   Evelyn [name] is a 38 y.o. female contacted today regarding refills of specialty medication(s) Dupilumab  (Dupixent )  Injection date: 06/15/24.   Patient requested: Delivery   Delivery date: 06/02/24   Verified address: [address]  [place] [address]  Medication will be filled on 06/01/24.

## 2024-07-06 ENCOUNTER — Other Ambulatory Visit: Payer: Self-pay

## 2024-07-16 ENCOUNTER — Other Ambulatory Visit: Payer: Self-pay

## 2024-07-17 ENCOUNTER — Other Ambulatory Visit: Payer: Self-pay

## 2024-07-17 ENCOUNTER — Encounter: Payer: Self-pay | Admitting: Allergy

## 2024-07-17 ENCOUNTER — Ambulatory Visit (INDEPENDENT_AMBULATORY_CARE_PROVIDER_SITE_OTHER): Admitting: Allergy

## 2024-07-17 VITALS — BP 112/78 | HR 69 | Temp 98.4°F | Resp 20 | Ht 62.25 in | Wt 158.8 lb

## 2024-07-17 DIAGNOSIS — L2084 Intrinsic (allergic) eczema: Secondary | ICD-10-CM

## 2024-07-17 DIAGNOSIS — J310 Chronic rhinitis: Secondary | ICD-10-CM

## 2024-07-17 DIAGNOSIS — T781XXD Other adverse food reactions, not elsewhere classified, subsequent encounter: Secondary | ICD-10-CM

## 2024-07-17 DIAGNOSIS — L509 Urticaria, unspecified: Secondary | ICD-10-CM | POA: Diagnosis not present

## 2024-07-17 DIAGNOSIS — J4520 Mild intermittent asthma, uncomplicated: Secondary | ICD-10-CM | POA: Diagnosis not present

## 2024-07-17 MED ORDER — FLUTICASONE PROPIONATE HFA 110 MCG/ACT IN AERO: 2.0000 | INHALATION_SPRAY | Freq: Two times a day (BID) | RESPIRATORY_TRACT | 5 refills | Status: AC

## 2024-07-17 MED ORDER — ALBUTEROL SULFATE HFA 108 (90 BASE) MCG/ACT IN AERS: INHALATION_SPRAY | RESPIRATORY_TRACT | 1 refills | Status: DC

## 2024-07-17 MED ORDER — LEVOCETIRIZINE DIHYDROCHLORIDE 5 MG PO TABS: 5.0000 mg | ORAL_TABLET | Freq: Two times a day (BID) | ORAL | 5 refills | Status: DC

## 2024-07-17 NOTE — Patient Instructions (Addendum)
 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 Hives are intermittent and can be very itchy.  Physical features can worsen hives like pressure and change in body temperature.   labs were reassuring.   -for management of hives continue levocetirizine 5mg  1-2 tabs a day  - for blistering rash (improved on dupixent ) on face continue use of Tacrolimus as needed.    - continue Dupixent  injections every 2 weeks at this time.  Dupixent  is now approved for atopic dermaitis (eczema), hives (urticaria) as well as blister conditions.    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 In case of an allergic reaction, take Benadryl  50 mg  every 4 hours, and if life-threatening symptoms occur, inject with EpiPen  0.3 mg. -IgE levels are negative thus if you would like to eat these foods can perform in-office food challenges.   Environmental allergies -Continue levocetirizine 5 mg 1-2 tabs a day as needed for allergy symptom control -Continue Ryaltris  2 sprays in each nostril up to twice a day as needed for nasal symptoms -Consider saline nasal rinses as needed for nasal symptoms. Use this before any medicated nasal sprays for best result  Reactive airway -symptoms relieved with albuterol  use -start Flovent  110mcg 2 puffs twice a day with spacer device  rinse mouth after use.  -have access to albuterol  inhaler 2 puffs every 4-6 hours as needed for cough/wheeze/shortness of breath/chest tightness.  Use 15-20 minutes prior to activity.   Monitor frequency of use.    Breathing control goals:  Full participation in all desired activities (may need albuterol  before activity) Albuterol  use two time or less a week on average (not counting use with activity) Cough interfering with sleep two time or less a month Oral steroids no more than once a year No hospitalizations  Follow-up in 4-6  months or sooner if needed

## 2024-07-17 NOTE — Progress Notes (Signed)
 Follow-up Note  RE: Evelyn [name] MRN: [id number] DOB: 09-21-1986 Date of Office Visit: 07/17/2024   History of present illness: Evelyn [name] is a 38 y.o. female presenting today for follow-up of dermatitis, allergic rhinitis, adverse food reaction and reactive airway.  She was last seen in the office on 11/18/2023 by our nurse practitioner Ambs Discussed the use of AI scribe software for clinical note transcription with the patient, who gave verbal consent to proceed.  She has been using Dupixent  for her skin conditions, which include hives and a blistering condition. While she still experiences some symptoms, they are not severe and much more manageable. Attempts to extend the interval between Dupixent  doses beyond two weeks result in increased breakouts and rashes.  She has had some issues with delivery which has been late on arrival.  She uses tacrolimus ointment sparingly on her face.  She experiences significant environmental allergies this year, with symptoms including throat itchiness, sneezing, and itchy eyes. She takes levocetirizine once daily at night and uses Ryaltris  nasal spray twice daily. The levocetirizine is effective, as she notices worsening symptoms if she misses a dose. She has not tried to take an extra dose of levocetirizine. She also uses eye drops for her symptoms.  She has been using her albuterol  inhaler one to two times per day, particularly when exposed to heat or during physical activity, such as walking to school. This frequency of use has led her to apply for disability. She has not been using the inhaler as a pretreatment but rather in response to symptoms. She has not required urgent care or emergency department visits for her breathing issues, nor has she needed prednisone  for respiratory symptoms.     Review of systems: 10pt ROS negative unless noted above in HPI  Past medical/social/surgical/family history have been reviewed and are  unchanged unless specifically indicated below.  No changes  Medication List: Current Outpatient Medications  Medication Sig Dispense Refill   AJOVY 225 MG/1.5ML SOAJ Inject 225 mg into the skin.     albuterol  (VENTOLIN  HFA) 108 (90 Base) MCG/ACT inhaler Inhale two puffs every 4-6 hours if needed for cough or wheeze. 18 g 1   Carbinoxamine  Maleate 4 MG/5ML SOLN Take 10 mLs (8 mg total) by mouth 2 (two) times daily. 473 mL 2   Cholecalciferol (VITAMIN D ) 50 MCG (2000 UT) CAPS Take by mouth daily.     dupilumab  (DUPIXENT ) 300 MG/2ML prefilled syringe Inject 300 mg into the skin every 14 (fourteen) days. 4 mL 11   EPINEPHrine  0.3 mg/0.3 mL IJ SOAJ injection As needed for life-threatening allergic reactions 1 each 1   famotidine  (PEPCID ) 20 MG tablet Take 1 tablet (20 mg total) by mouth 2 (two) times daily. 60 tablet 5   hydroxychloroquine  (PLAQUENIL ) 200 MG tablet Take 1 tablet by mouth twice daily Monday through Friday only. 120 tablet 0   ipratropium (ATROVENT ) 0.06 % nasal spray 2 SPRAYS EACH NOSTRIL UP TO 3-4 TIMES A DAY AS NEEDED FOR NASAL CONGESTION/DRAINAGE. 15 mL 5   lansoprazole (PREVACID) 30 MG capsule Take 30 mg by mouth daily.     leflunomide (ARAVA) 20 MG tablet Take 20 mg by mouth daily.     levocetirizine (XYZAL ) 5 MG tablet Take 1 tablet by mouth daily as needed for runny nose or itch. 90 tablet 0   modafinil (PROVIGIL) 100 MG tablet Take 100 mg by mouth daily.     nystatin  ointment (MYCOSTATIN ) Apply 1 Application topically 2 (  two) times daily. 30 g 0   nystatin -triamcinolone  ointment (MYCOLOG) Apply 1 Application topically 2 (two) times daily. 30 g 0   Olopatadine  HCl (PATADAY ) 0.2 % SOLN Place 1 drop into both eyes daily as needed. 2.5 mL 5   Olopatadine  HCl (PATADAY ) 0.2 % SOLN 1 drop each eye daily as needed. 2.5 mL 5   predniSONE  (DELTASONE ) 5 MG tablet Take 5 mg by mouth daily as needed.     prochlorperazine (COMPAZINE) 5 MG tablet Take 5-10 mg by mouth.     solifenacin  (VESICARE) 10 MG tablet Take 10 mg by mouth at bedtime.     sucralfate (CARAFATE) 1 g tablet Take 1 g by mouth 3 (three) times daily.     SUMAtriptan (IMITREX) 100 MG tablet Take 100 mg by mouth.     tiZANidine  (ZANAFLEX ) 2 MG tablet Take 2 mg by mouth.     topiramate (TOPAMAX) 100 MG tablet Take 100 mg by mouth daily.     triamcinolone  (KENALOG ) 0.1 % paste Place onto teeth.     triamcinolone  ointment (KENALOG ) 0.1 % Apply 1 Application topically 2 (two) times daily. 30 g 0   valACYclovir  (VALTREX ) 500 MG tablet Take 1 tablet (500 mg total) by mouth daily. 30 tablet 12   venlafaxine  XR (EFFEXOR -XR) 75 MG 24 hr capsule Take 3 capsules (225 mg total) by mouth daily with breakfast. 90 capsule 12   Vitamin D , Ergocalciferol , (DRISDOL) 1.25 MG (50000 UNIT) CAPS capsule Take 50,000 Units by mouth once a week.     celecoxib  (CELEBREX ) 100 MG capsule TAKE 1 CAPSULE BY MOUTH EVERY DAY (Patient not taking: Reported on 07/17/2024) 30 capsule 0   folic acid (FOLVITE) 1 MG tablet Take 1 tablet by mouth daily. (Patient not taking: Reported on 07/17/2024)     methotrexate (RHEUMATREX) 2.5 MG tablet Take 15 mg by mouth once a week. (Patient not taking: Reported on 07/17/2024)     Olopatadine -Mometasone (RYALTRIS ) 665-25 MCG/ACT SUSP Apply 2 sprays each nostril twice a day as needed (Patient not taking: Reported on 07/17/2024) 29 g 5   RYVENT  6 MG TABS Take 1 tablet (6 mg total) by mouth in the morning and at bedtime. 60 tablet 5   traMADol  (ULTRAM ) 50 MG tablet Take 1 tablet (50 mg total) by mouth 3 (three) times daily as needed. (Patient not taking: Reported on 07/17/2024) 30 tablet 2   Current Facility-Administered Medications  Medication Dose Route Frequency Provider Last Rate Last Admin   dupilumab  (DUPIXENT ) prefilled syringe 300 mg  300 mg Subcutaneous Q14 Days Jeneal Danita Macintosh, MD   300 mg at 10/11/23 1005     Known medication allergies: No Known Allergies   Physical examination: Blood  pressure 112/78, pulse 69, temperature 98.4 F (36.9 C), temperature source Temporal, resp. rate 20, height 5' 2.25 (1.581 m), weight 158 lb 12.8 oz (72 kg), SpO2 99%.  General: Alert, interactive, in no acute distress. HEENT: PERRLA, TMs pearly gray, turbinates non-edematous without discharge, post-pharynx non erythematous. Neck: Supple without lymphadenopathy. Lungs: Clear to auscultation without wheezing, rhonchi or rales. {no increased work of breathing. CV: Normal S1, S2 without murmurs. Abdomen: Nondistended, nontender. Skin: Warm and dry, without lesions or rashes. Extremities:  No clubbing, cyanosis or edema. Neuro:   Grossly intact.  Diagnostics/Labs:  Spirometry: FEV1: 2.93L 113%, FVC: 3.44L 112%, ratio consistent with nonobstructive pattern  Assessment and plan: Dermatitis--1) eczematous/blistering; 2) urticarial  -description and appearance of the rash on body appears to be a variant  of hives- cholinergic hives. Hives are intermittent and can be very itchy.  Physical features can worsen hives like pressure and change in body temperature.   labs were reassuring.   -for management of hives continue levocetirizine 5mg  1-2 tabs a day  - for blistering rash (improved on dupixent ) on face continue use of Tacrolimus as needed.    - continue Dupixent  injections every 2 weeks at this time.  Dupixent  is now approved for atopic dermaitis (eczema), hives (urticaria) as well as blister conditions.    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 In case of an allergic reaction, take Benadryl  50 mg  every 4 hours, and if life-threatening symptoms occur, inject with EpiPen  0.3 mg. -IgE levels are negative thus if you would like to eat these foods can perform in-office food challenges.   Environmental allergies -Continue levocetirizine 5 mg 1-2 tabs a day as needed for allergy symptom control -Continue Ryaltris  2 sprays in each nostril up to twice a day as needed for  nasal symptoms -Consider saline nasal rinses as needed for nasal symptoms. Use this before any medicated nasal sprays for best result  Reactive airway - not well controlled -symptoms relieved with albuterol  use -start Flovent  110mcg 2 puffs twice a day with spacer device  rinse mouth after use.  -have access to albuterol  inhaler 2 puffs every 4-6 hours as needed for cough/wheeze/shortness of breath/chest tightness.  Use 15-20 minutes prior to activity.   Monitor frequency of use.    Breathing control goals:  Full participation in all desired activities (may need albuterol  before activity) Albuterol  use two time or less a week on average (not counting use with activity) Cough interfering with sleep two time or less a month Oral steroids no more than once a year No hospitalizations  Follow-up in 4-6 months or sooner if needed  I appreciate the opportunity to take part in Evelyn [name]'s care. Please do not hesitate to contact me with questions.  Sincerely,   Danita Brain, MD Allergy/Immunology [facility]

## 2024-09-24 ENCOUNTER — Other Ambulatory Visit: Payer: Self-pay

## 2024-09-24 NOTE — Progress Notes (Signed)
 Specialty Pharmacy Refill Coordination Note  Evelyn [name] is a 38 y.o. female contacted today regarding refills of specialty medication(s) Dupilumab  (Dupixent )   Patient requested Delivery   Delivery date: 09/29/24   Verified address: [address]  [place] [address]   Medication will be filled on 09/28/24.

## 2024-09-28 ENCOUNTER — Other Ambulatory Visit: Payer: Self-pay

## 2024-10-05 ENCOUNTER — Encounter: Payer: Self-pay | Admitting: Radiology

## 2024-10-19 ENCOUNTER — Other Ambulatory Visit: Payer: Self-pay

## 2024-10-21 ENCOUNTER — Other Ambulatory Visit (HOSPITAL_COMMUNITY): Payer: Self-pay

## 2024-10-21 ENCOUNTER — Other Ambulatory Visit: Payer: Self-pay

## 2024-10-21 NOTE — Progress Notes (Signed)
 Specialty Pharmacy Refill Coordination Note  Evelyn [name] is a 38 y.o. female contacted today regarding refills of specialty medication(s) Dupilumab  (Dupixent )   Patient requested Delivery   Delivery date: 10/23/24   Verified address: [address]  [place] [id number]   Medication will be filled on: 10/22/24

## 2024-11-02 ENCOUNTER — Other Ambulatory Visit: Payer: Self-pay

## 2024-11-02 NOTE — Progress Notes (Signed)
 Specialty Pharmacy Ongoing Clinical Assessment Note  Evelyn [name] is a 38 y.o. female who is being followed by the specialty pharmacy service for RxSp Atopic Dermatitis   Patient's specialty medication(s) reviewed today: Dupilumab  (Dupixent )   Missed doses in the last 4 weeks: 0   Patient/Caregiver did not have any additional questions or concerns.   Therapeutic benefit summary: Patient is achieving benefit   Adverse events/side effects summary: No adverse events/side effects   Patient's therapy is appropriate to: Continue    Goals Addressed             This Visit's Progress    Minimize recurrence of flares   On track    Patient is on track. Patient will maintain adherence.  Patient reports that she remains well-controlled on therapy.         Follow up: 12 months  [facility]

## 2024-11-11 ENCOUNTER — Other Ambulatory Visit (HOSPITAL_COMMUNITY): Payer: Self-pay

## 2024-11-11 ENCOUNTER — Other Ambulatory Visit: Payer: Self-pay

## 2024-11-11 ENCOUNTER — Other Ambulatory Visit: Payer: Self-pay | Admitting: Allergy

## 2024-11-11 NOTE — Progress Notes (Signed)
 Specialty Pharmacy Refill Coordination Note  MyChart Questionnaire Submission  Evelyn [name] is a 38 y.o. female contacted today regarding refills of specialty medication(s) Dupixent .  Doses on hand: (Patient-Rptd) 0   Injection date: (Patient-Rptd) 11/20/24  Patient requested: (Patient-Rptd) Delivery   Delivery date: 11/17/24  Verified address: [address]  [place] [address]  Medication will be filled on 11/16/24 This fill date is pending response to refill request from provider. Patient is aware and if they have not received fill by intended date, they must follow up with pharmacy.

## 2024-11-12 ENCOUNTER — Other Ambulatory Visit (HOSPITAL_COMMUNITY): Payer: Self-pay

## 2024-11-12 ENCOUNTER — Other Ambulatory Visit: Payer: Self-pay

## 2024-11-12 ENCOUNTER — Other Ambulatory Visit: Payer: Self-pay | Admitting: Pharmacist

## 2024-11-12 MED ORDER — DUPIXENT 300 MG/2ML ~~LOC~~ SOSY
300.0000 mg | PREFILLED_SYRINGE | SUBCUTANEOUS | 11 refills | Status: AC
  Filled 2024-11-12: qty 4, 28d supply, fill #0
  Filled 2024-12-07: qty 4, 28d supply, fill #1
  Filled 2025-01-01: qty 4, 28d supply, fill #2

## 2024-11-12 NOTE — Progress Notes (Signed)
 Clinical Intervention Note  Clinical Intervention Notes: Patient reported starting Concerta. No DDI's found with Dupixent .   Clinical Intervention Outcomes: Prevention of an adverse drug event   Lyle LELON Chalk Specialty Pharmacist

## 2024-11-13 ENCOUNTER — Other Ambulatory Visit (HOSPITAL_COMMUNITY): Payer: Self-pay

## 2024-11-16 ENCOUNTER — Other Ambulatory Visit (HOSPITAL_COMMUNITY): Payer: Self-pay

## 2024-11-16 ENCOUNTER — Other Ambulatory Visit: Payer: Self-pay

## 2024-12-04 ENCOUNTER — Other Ambulatory Visit: Payer: Self-pay

## 2024-12-07 ENCOUNTER — Other Ambulatory Visit: Payer: Self-pay

## 2024-12-09 ENCOUNTER — Other Ambulatory Visit: Payer: Self-pay

## 2024-12-09 NOTE — Progress Notes (Signed)
 Specialty Pharmacy Refill Coordination Note  Evelyn [name] is a 39 y.o. female contacted today regarding refills of specialty medication(s) Dupilumab  (Dupixent )   Patient requested Delivery   Delivery date: 12/11/24   Verified address: [address] [place] [address]   Medication will be filled on: 12/10/24

## 2024-12-10 ENCOUNTER — Other Ambulatory Visit: Payer: Self-pay

## 2024-12-17 ENCOUNTER — Ambulatory Visit: Admitting: Allergy

## 2024-12-21 NOTE — Patient Instructions (Incomplete)
 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 Hives are intermittent and can be very itchy.  Physical features can worsen hives like pressure and change in body temperature.   labs were reassuring.   -for management of hives continue levocetirizine 5mg  1-2 tabs a day  - for blistering rash (improved on dupixent ) on face continue use of Tacrolimus as needed.    - continue Dupixent  injections every 2 weeks at this time.  Dupixent  is now approved for atopic dermaitis (eczema), hives (urticaria) as well as blister conditions.    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 In case of an allergic reaction, take Benadryl  50 mg  every 4 hours, and if life-threatening symptoms occur, inject with EpiPen  0.3 mg. -IgE levels are negative thus if you would like to eat these foods can perform in-office food challenges.   Environmental allergies -Continue levocetirizine 5 mg 1-2 tabs a day as needed for allergy symptom control -Continue Ryaltris  2 sprays in each nostril up to twice a day as needed for nasal symptoms -Consider saline nasal rinses as needed for nasal symptoms. Use this before any medicated nasal sprays for best result  Reactive airway-not well controlled -symptoms relieved with albuterol  use -STOP Flovent  (fluticasone )  -START Symbicort  160/4.5 mcg- inhale 2 puffs twice a day with spacer.  -have access to albuterol  inhaler 2 puffs every 4-6 hours as needed for cough/wheeze/shortness of breath/chest tightness.  Use 15-20 minutes prior to activity.   Monitor frequency of use.    Breathing control goals:  Full participation in all desired activities (may need albuterol  before activity) Albuterol  use two time or less a week on average (not counting use with activity) Cough interfering with sleep two time or less a month Oral steroids no more than once a year No  hospitalizations  Recommend speaking with primary care physician about shortness of breath with exertion also for possible other cause Follow-up in 6 weeks or sooner if needed

## 2024-12-22 ENCOUNTER — Ambulatory Visit: Admitting: Family

## 2024-12-22 ENCOUNTER — Encounter: Payer: Self-pay | Admitting: Family

## 2024-12-22 VITALS — BP 100/80 | HR 88 | Temp 98.3°F

## 2024-12-22 DIAGNOSIS — J4520 Mild intermittent asthma, uncomplicated: Secondary | ICD-10-CM | POA: Diagnosis not present

## 2024-12-22 DIAGNOSIS — L2084 Intrinsic (allergic) eczema: Secondary | ICD-10-CM

## 2024-12-22 DIAGNOSIS — T7819XD Other adverse food reactions, not elsewhere classified, subsequent encounter: Secondary | ICD-10-CM

## 2024-12-22 DIAGNOSIS — L509 Urticaria, unspecified: Secondary | ICD-10-CM

## 2024-12-22 DIAGNOSIS — J310 Chronic rhinitis: Secondary | ICD-10-CM | POA: Diagnosis not present

## 2024-12-22 DIAGNOSIS — L508 Other urticaria: Secondary | ICD-10-CM | POA: Diagnosis not present

## 2024-12-22 MED ORDER — BUDESONIDE-FORMOTEROL FUMARATE 160-4.5 MCG/ACT IN AERO: INHALATION_SPRAY | RESPIRATORY_TRACT | 5 refills | Status: AC

## 2024-12-22 MED ORDER — LEVOCETIRIZINE DIHYDROCHLORIDE 5 MG PO TABS: ORAL_TABLET | ORAL | 5 refills | Status: AC

## 2024-12-22 MED ORDER — EPINEPHRINE 0.3 MG/0.3ML IJ SOAJ: INTRAMUSCULAR | 1 refills | Status: AC

## 2024-12-22 MED ORDER — ALBUTEROL SULFATE HFA 108 (90 BASE) MCG/ACT IN AERS: INHALATION_SPRAY | RESPIRATORY_TRACT | 1 refills | Status: AC

## 2024-12-22 NOTE — Progress Notes (Signed)
 "  [address] [place] [address] Dept: [phone]  FOLLOW UP NOTE  Patient ID: Evelyn [name], female    DOB: June 17, 1986  Age: 39 y.o. MRN: [id number] Date of Office Visit: 12/22/2024  Assessment  Chief Complaint: Follow-up (Asthma/allergies), Rash, and Breathing Problem (Shortness of breath while climbing 3 flights of stairs x 3 times a week)  HPI Evelyn [name] is a 39 year old female who presents today for follow-up of dermatitis-eczematous/blistering and urticaria, adverse food reaction, environmental allergies, and reactive airway disease.  She was last seen on July 17, 2024 by Dr. Jeneal.  She reports since her last office visit she has been diagnosed with ADHD.  She is on Concerta, but it is getting changed to Adderall and clonidine.  She has not had any surgeries since we last saw her.  Dermatitis-eczematous/blistering; urticaria: She reports that she recently broke out in a rash on her face, arms and the side of her abdomen, but this is gone now.  She does continue to receive Dupixent  injections every 2 weeks without any problems or reactions.  She reports that Dupixent  does help.  If she were not on Dupixent  the frequency of her rashes will increase.  She continues to take levocetirizine 5 mg once a day and uses Aveeno with sunscreen for moisturization.  Adverse food reaction: She continues to avoid dairy and mushrooms without any accidental ingestion or use of her epinephrine  autoinjector device.  She is not avoiding any other foods.  Chronic rhinitis: She does feel like her symptoms are less.  She is not sure if this is due to nasal sprays, Dupixent , or a combination of both.  She does use Ryaltris  nasal spray daily and takes levocetirizine 5 mg daily.  Reactive airway disease: She continues to take fluticasone  110 mcg 2 puffs twice a day 5 out of the 7 days.  She has not noticed much of a change with her shortness of breath with exertion since starting this  inhaler.  She reports that shortness of breath with exertion occurs 3-4 times a week when she is walking on campus and going up steps.  Today she had the same symptoms, but did not have her albuterol  inhaler and she had to wait a few minutes for her breathing to get better.  She also feels like colder weather causes her breathing to be worse. She does use her albuterol  before strenuous activity.  She will cough when she is out of breath.  She denies wheezing, tightness in her chest, and nocturnal awakenings due to breathing problems.  Since her last office visit she has not required any systemic steroids for her asthma or made any trips to the emergency room or urgent care due to breathing problems.  She does take prednisone  5 mg once a day as needed for lupus flareups.  She reports that when she uses her albuterol  for her shortness of breath with exertion that it does help some.  It helps approximately 70%.  She does not use her albuterol  much because she does not have big attacks.   Drug Allergies:  Allergies[1]  Review of Systems: Negative except as per HPI   Physical Exam: BP 100/80   Pulse 88   Temp 98.3 F (36.8 C)   SpO2 99%    Physical Exam Constitutional:      Appearance: Normal appearance.  HENT:     Head: Normocephalic and atraumatic.     Comments: Pharynx normal, eyes normal, ears normal, nose normal  Right Ear: Tympanic membrane, ear canal and external ear normal.     Left Ear: Tympanic membrane, ear canal and external ear normal.     Nose: Nose normal.     Mouth/Throat:     Mouth: Mucous membranes are moist.     Pharynx: Oropharynx is clear.  Eyes:     Conjunctiva/sclera: Conjunctivae normal.  Cardiovascular:     Rate and Rhythm: Regular rhythm.     Heart sounds: Normal heart sounds.  Pulmonary:     Effort: Pulmonary effort is normal.     Breath sounds: Normal breath sounds.     Comments: Lungs clear to auscultation Musculoskeletal:     Cervical back: Neck  supple.  Skin:    General: Skin is warm.     Comments: No rashes or urticarial lesions noted  Neurological:     Mental Status: She is alert and oriented to person, place, and time.  Psychiatric:        Mood and Affect: Mood normal.        Behavior: Behavior normal.        Thought Content: Thought content normal.        Judgment: Judgment normal.     Diagnostics: FVC 3.48 L (113%), FEV1 2.89 L (111%), FEV1/FVC 0.83.  Spirometry indicates normal spirometry.  Assessment and Plan: 1. Intrinsic atopic dermatitis   2. Mild intermittent reactive airway disease without complication   3. Urticarial dermatitis   4. Chronic urticaria   5. Adverse food reaction, subsequent encounter   6. Chronic rhinitis     Meds ordered this encounter  Medications   albuterol  (VENTOLIN  HFA) 108 (90 Base) MCG/ACT inhaler    Sig: Inhale two puffs every 4-6 hours if needed for cough or wheeze.    Dispense:  18 g    Refill:  1    Dispense whichever albuterol  inhaler is covered by insurance please.   EPINEPHrine  0.3 mg/0.3 mL IJ SOAJ injection    Sig: As needed for life-threatening allergic reactions    Dispense:  1 each    Refill:  1    Patient will call when needed.  Thank you   levocetirizine (XYZAL ) 5 MG tablet    Sig: Take 1 tablet by mouth once or twice a day as needed for runny nose/itching    Dispense:  60 tablet    Refill:  5   budesonide -formoterol  (SYMBICORT ) 160-4.5 MCG/ACT inhaler    Sig: Inhale 2 puffs twice a day with spacer. Rinse mouth out after    Dispense:  1 each    Refill:  5    Patient Instructions  Dermatitis--1) eczematous/blistering; 2) urticarial  -description and appearance of the rash on body appears to be a variant of hives- cholinergic hives. Hives are intermittent and can be very itchy.  Physical features can worsen hives like pressure and change in body temperature.   labs were reassuring.   -for management of hives continue levocetirizine 5mg  1-2 tabs a day  -  for blistering rash (improved on dupixent ) on face continue use of Tacrolimus as needed.    - continue Dupixent  injections every 2 weeks at this time.  Dupixent  is now approved for atopic dermaitis (eczema), hives (urticaria) as well as blister conditions.    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 In case of an allergic reaction, take Benadryl  50 mg  every 4 hours, and if life-threatening symptoms occur, inject with EpiPen  0.3 mg. -IgE levels are negative thus  if you would like to eat these foods can perform in-office food challenges.   Environmental allergies -Continue levocetirizine 5 mg 1-2 tabs a day as needed for allergy symptom control -Continue Ryaltris  2 sprays in each nostril up to twice a day as needed for nasal symptoms -Consider saline nasal rinses as needed for nasal symptoms. Use this before any medicated nasal sprays for best result  Reactive airway-not well controlled -symptoms relieved with albuterol  use -STOP Flovent  (fluticasone ) 110mcg  -START Symbicort  160/4.5 mcg- inhale 2 puffs twice a day with spacer.  -have access to albuterol  inhaler 2 puffs every 4-6 hours as needed for cough/wheeze/shortness of breath/chest tightness.  Use 15-20 minutes prior to activity.   Monitor frequency of use.    Breathing control goals:  Full participation in all desired activities (may need albuterol  before activity) Albuterol  use two time or less a week on average (not counting use with activity) Cough interfering with sleep two time or less a month Oral steroids no more than once a year No hospitalizations  Recommend speaking with primary care physician about shortness of breath with exertion also for possible other cause Follow-up in 6 weeks or sooner if needed  Return in about 6 weeks (around 02/02/2025).    Thank you for the opportunity to care for this patient.  Please do not hesitate to contact me with questions.  Wanda Craze, FNP [facility]         [1] No Known Allergies  "

## 2024-12-31 ENCOUNTER — Other Ambulatory Visit (HOSPITAL_COMMUNITY): Payer: Self-pay

## 2025-01-01 ENCOUNTER — Other Ambulatory Visit (HOSPITAL_COMMUNITY): Payer: Self-pay

## 2025-01-01 ENCOUNTER — Other Ambulatory Visit: Payer: Self-pay

## 2025-01-05 ENCOUNTER — Other Ambulatory Visit: Payer: Self-pay

## 2025-01-05 NOTE — Progress Notes (Signed)
 Specialty Pharmacy Refill Coordination Note  Evelyn [name] is a 39 y.o. female contacted today regarding refills of specialty medication(s) Dupilumab  (Dupixent )   Patient requested Delivery   Delivery date: 01/14/25   Verified address: [address] [place] [address]   Medication will be filled on: 01/13/25

## 2025-01-05 NOTE — Progress Notes (Signed)
 Clinical Intervention Note  Clinical Intervention Notes: Patient reporting changing from Concerta to Adderall XR and adding clonidine. No DDIs identified with Dupixent .   Clinical Intervention Outcomes: Prevention of an adverse drug event   Advertising Account Planner

## 2025-02-02 ENCOUNTER — Ambulatory Visit: Admitting: Family

## 2025-04-06 ENCOUNTER — Encounter (INDEPENDENT_AMBULATORY_CARE_PROVIDER_SITE_OTHER): Admitting: Ophthalmology
